# Patient Record
Sex: Female | Born: 1970 | Race: White | Hispanic: No | Marital: Married | Smoking: Never smoker
Health system: Southern US, Community
[De-identification: ages and names within clinical notes are randomized; demographics above are authoritative.]

## PROBLEM LIST (undated history)

## (undated) DIAGNOSIS — I8393 Asymptomatic varicose veins of bilateral lower extremities: Secondary | ICD-10-CM

## (undated) DIAGNOSIS — G43909 Migraine, unspecified, not intractable, without status migrainosus: Secondary | ICD-10-CM

## (undated) DIAGNOSIS — K219 Gastro-esophageal reflux disease without esophagitis: Secondary | ICD-10-CM

## (undated) DIAGNOSIS — I872 Venous insufficiency (chronic) (peripheral): Secondary | ICD-10-CM

## (undated) DIAGNOSIS — F329 Major depressive disorder, single episode, unspecified: Secondary | ICD-10-CM

## (undated) DIAGNOSIS — E079 Disorder of thyroid, unspecified: Secondary | ICD-10-CM

## (undated) DIAGNOSIS — K8010 Calculus of gallbladder with chronic cholecystitis without obstruction: Secondary | ICD-10-CM

## (undated) DIAGNOSIS — M1990 Unspecified osteoarthritis, unspecified site: Secondary | ICD-10-CM

## (undated) DIAGNOSIS — F419 Anxiety disorder, unspecified: Secondary | ICD-10-CM

## (undated) DIAGNOSIS — R030 Elevated blood-pressure reading, without diagnosis of hypertension: Secondary | ICD-10-CM

## (undated) DIAGNOSIS — F32A Depression, unspecified: Secondary | ICD-10-CM

## (undated) DIAGNOSIS — R12 Heartburn: Secondary | ICD-10-CM

## (undated) DIAGNOSIS — E039 Hypothyroidism, unspecified: Secondary | ICD-10-CM

## (undated) DIAGNOSIS — Z87442 Personal history of urinary calculi: Secondary | ICD-10-CM

## (undated) DIAGNOSIS — E669 Obesity, unspecified: Secondary | ICD-10-CM

## (undated) DIAGNOSIS — E785 Hyperlipidemia, unspecified: Secondary | ICD-10-CM

## (undated) HISTORY — DX: Calculus of gallbladder with chronic cholecystitis without obstruction: K80.10

## (undated) HISTORY — DX: Migraine, unspecified, not intractable, without status migrainosus: G43.909

## (undated) HISTORY — PX: OTHER SURGICAL HISTORY: SHX169

## (undated) HISTORY — DX: Depression, unspecified: F32.A

## (undated) HISTORY — DX: Asymptomatic varicose veins of bilateral lower extremities: I83.93

## (undated) HISTORY — DX: Venous insufficiency (chronic) (peripheral): I87.2

## (undated) HISTORY — DX: Disorder of thyroid, unspecified: E07.9

## (undated) HISTORY — DX: Hyperlipidemia, unspecified: E78.5

## (undated) HISTORY — DX: Anxiety disorder, unspecified: F41.9

## (undated) HISTORY — DX: Obesity, unspecified: E66.9

## (undated) HISTORY — DX: Unspecified osteoarthritis, unspecified site: M19.90

## (undated) HISTORY — DX: Major depressive disorder, single episode, unspecified: F32.9

## (undated) HISTORY — DX: Heartburn: R12

---

## 1998-03-04 ENCOUNTER — Other Ambulatory Visit: Admission: RE | Admit: 1998-03-04 | Discharge: 1998-03-04 | Payer: Self-pay | Admitting: *Deleted

## 1998-08-23 ENCOUNTER — Inpatient Hospital Stay (HOSPITAL_COMMUNITY): Admission: AD | Admit: 1998-08-23 | Discharge: 1998-08-23 | Payer: Self-pay | Admitting: Obstetrics and Gynecology

## 1998-10-06 ENCOUNTER — Inpatient Hospital Stay (HOSPITAL_COMMUNITY): Admission: AD | Admit: 1998-10-06 | Discharge: 1998-10-09 | Payer: Self-pay | Admitting: *Deleted

## 1998-11-25 ENCOUNTER — Other Ambulatory Visit: Admission: RE | Admit: 1998-11-25 | Discharge: 1998-11-25 | Payer: Self-pay | Admitting: *Deleted

## 1999-12-20 ENCOUNTER — Other Ambulatory Visit: Admission: RE | Admit: 1999-12-20 | Discharge: 1999-12-20 | Payer: Self-pay | Admitting: Obstetrics & Gynecology

## 2007-07-11 ENCOUNTER — Ambulatory Visit: Payer: Self-pay

## 2008-05-20 ENCOUNTER — Ambulatory Visit: Payer: Self-pay | Admitting: Family Medicine

## 2010-09-19 ENCOUNTER — Ambulatory Visit: Payer: Self-pay

## 2012-01-02 ENCOUNTER — Ambulatory Visit: Payer: Self-pay

## 2013-01-03 ENCOUNTER — Ambulatory Visit: Payer: Self-pay | Admitting: Neurology

## 2013-09-14 ENCOUNTER — Ambulatory Visit: Payer: Self-pay

## 2014-08-18 ENCOUNTER — Ambulatory Visit: Payer: BC Managed Care – PPO | Admitting: Internal Medicine

## 2015-03-03 ENCOUNTER — Other Ambulatory Visit: Payer: Self-pay

## 2015-03-03 NOTE — Telephone Encounter (Signed)
Chart shows that [place] (Dr. Gabriel Carina) prescribes this Please send to her or have pharmacy contact Dr. Gabriel Carina Thanks

## 2015-03-04 ENCOUNTER — Telehealth: Payer: Self-pay | Admitting: Family Medicine

## 2015-03-04 MED ORDER — LEVOTHYROXINE SODIUM 112 MCG PO TABS: 112.0000 ug | ORAL_TABLET | Freq: Every day | ORAL | Status: DC

## 2015-03-04 NOTE — Telephone Encounter (Signed)
Pt called stated she needs a refill on her Synthroid, pt states she has seen Dr. Sanda Klein and had labs drawn by her to check levels and that she transferred care from Dr. Purnell Shoemaker?  to Dr. Sanda Klein for treatment of thyroid issues. Pt stated she is completely out of her medication and is leaving to go out of town early in the morning. Pt last seen for this issue on 12/31/14.

## 2015-03-04 NOTE — Telephone Encounter (Signed)
I left message for patient to call Dr. Joycie Peek office and have them refill the med.

## 2015-03-04 NOTE — Telephone Encounter (Signed)
Thank you. I did not realize she is no longer seeing Dr. Gabriel Carina. Rx sent

## 2015-05-05 ENCOUNTER — Other Ambulatory Visit: Payer: Self-pay | Admitting: Certified Nurse Midwife

## 2015-05-05 ENCOUNTER — Other Ambulatory Visit: Payer: Self-pay | Admitting: Family Medicine

## 2015-05-05 DIAGNOSIS — Z1231 Encounter for screening mammogram for malignant neoplasm of breast: Secondary | ICD-10-CM

## 2015-05-06 ENCOUNTER — Ambulatory Visit
Admission: RE | Admit: 2015-05-06 | Discharge: 2015-05-06 | Disposition: A | Discharge status: Home / Self Care | Payer: BC Managed Care – PPO | Source: Ambulatory Visit | Attending: Family Medicine | Admitting: Family Medicine

## 2015-05-06 DIAGNOSIS — Z1231 Encounter for screening mammogram for malignant neoplasm of breast: Secondary | ICD-10-CM | POA: Insufficient documentation

## 2015-06-24 ENCOUNTER — Ambulatory Visit: Payer: Self-pay | Admitting: Family Medicine

## 2015-08-26 ENCOUNTER — Other Ambulatory Visit: Payer: Self-pay

## 2015-08-26 NOTE — Telephone Encounter (Signed)
We last saw patient back in April. She was wanting a refill on Sumatriptan. She was getting it from Dr. Brigitte Pulse but has not seen him in several years and they will no longer refill it.

## 2015-08-27 MED ORDER — SUMATRIPTAN SUCCINATE 100 MG PO TABS
ORAL_TABLET | ORAL | Status: DC
Start: 2015-08-27 — End: 2018-10-01

## 2015-08-27 NOTE — Telephone Encounter (Signed)
Last BP reviewed; rx approved

## 2015-12-15 ENCOUNTER — Ambulatory Visit (INDEPENDENT_AMBULATORY_CARE_PROVIDER_SITE_OTHER): Payer: BC Managed Care – PPO | Admitting: Family Medicine

## 2015-12-15 ENCOUNTER — Encounter: Payer: Self-pay | Admitting: Family Medicine

## 2015-12-15 VITALS — BP 138/64 | HR 109 | Temp 99.8°F | Ht 65.0 in | Wt 214.0 lb

## 2015-12-15 DIAGNOSIS — J069 Acute upper respiratory infection, unspecified: Secondary | ICD-10-CM

## 2015-12-15 DIAGNOSIS — J189 Pneumonia, unspecified organism: Secondary | ICD-10-CM

## 2015-12-15 DIAGNOSIS — J181 Lobar pneumonia, unspecified organism: Principal | ICD-10-CM

## 2015-12-15 LAB — VERITOR FLU A/B WAIVED
INFLUENZA A: NEGATIVE
Influenza B: NEGATIVE

## 2015-12-15 MED ORDER — DOXYCYCLINE HYCLATE 100 MG PO TABS: 100.0000 mg | ORAL_TABLET | Freq: Two times a day (BID) | ORAL | Status: DC

## 2015-12-15 MED ORDER — HYDROCOD POLST-CPM POLST ER 10-8 MG/5ML PO SUER: 5.0000 mL | Freq: Every evening | ORAL | Status: DC | PRN

## 2015-12-15 MED ORDER — BENZONATATE 200 MG PO CAPS: 200.0000 mg | ORAL_CAPSULE | Freq: Two times a day (BID) | ORAL | Status: DC | PRN

## 2015-12-15 NOTE — Progress Notes (Signed)
BP 138/64 mmHg  Pulse 109  Temp(Src) 99.8 F (37.7 C)  Ht 5\' 5"  (1.651 m)  Wt 214 lb (97.07 kg)  BMI 35.61 kg/m2  SpO2 96%  LMP 09/07/2014 (Approximate)   Subjective:    Patient ID: Nicole Osborn, female    DOB: [birth year], 45 y.o.   MRN: [id number]  HPI: Nicole Osborn is a 45 y.o. female  Chief Complaint  Patient presents with  . URI    x3 weeks / taking Nyquil- worsened yesterday   UPPER RESPIRATORY TRACT INFECTION Duration: 3 weeks, but changed and got much worse yesterday Worst symptom: body aches Fever: yes Cough: yes Shortness of breath: no Wheezing: no Chest pain: no Chest tightness: yes Chest congestion: yes Nasal congestion: yes Runny nose: yes Post nasal drip: yes Sneezing: yes Sore throat: yes Swollen glands: yes Sinus pressure: yes Headache: yes Face pain: no Toothache: no Ear pain: no  Ear pressure: yes bilateral Eyes red/itching:yes Eye drainage/crusting: yes  Vomiting: yes Rash: no Fatigue: yes Sick contacts: no Strep contacts: no  Context: worse Recurrent sinusitis: no Relief with OTC cold/cough medications: no  Treatments attempted: anti-histamine and cough syrup   Relevant past medical, surgical, family and social history reviewed and updated as indicated. Interim medical history since our last visit reviewed. Allergies and medications reviewed and updated.  Review of Systems  Constitutional: Positive for fever, chills, diaphoresis and fatigue. Negative for activity change, appetite change and unexpected weight change.  HENT: Positive for congestion, postnasal drip, rhinorrhea, sinus pressure, sneezing and sore throat. Negative for dental problem, drooling, ear discharge, ear pain, facial swelling, hearing loss, mouth sores, nosebleeds, tinnitus, trouble swallowing and voice change.   Eyes: Negative.   Respiratory: Positive for cough, chest tightness, shortness of breath and wheezing. Negative for apnea, choking and stridor.    Cardiovascular: Negative.   Psychiatric/Behavioral: Negative.     Per HPI unless specifically indicated above     Objective:    BP 138/64 mmHg  Pulse 109  Temp(Src) 99.8 F (37.7 C)  Ht 5\' 5"  (1.651 m)  Wt 214 lb (97.07 kg)  BMI 35.61 kg/m2  SpO2 96%  LMP 09/07/2014 (Approximate)  Wt Readings from Last 3 Encounters:  12/15/15 214 lb (97.07 kg)  12/31/14 208 lb (94.348 kg)    Physical Exam  Constitutional: She is oriented to person, place, and time. She appears well-developed and well-nourished. No distress.  HENT:  Head: Normocephalic and atraumatic.  Right Ear: Hearing, tympanic membrane, external ear and ear canal normal.  Left Ear: Hearing, tympanic membrane, external ear and ear canal normal.  Nose: Mucosal edema and rhinorrhea present.  Mouth/Throat: Uvula is midline, oropharynx is clear and moist and mucous membranes are normal. No oropharyngeal exudate.  Eyes: Conjunctivae, EOM and lids are normal. Pupils are equal, round, and reactive to light. Right eye exhibits no discharge. Left eye exhibits no discharge. No scleral icterus.  Neck: Normal range of motion. Neck supple. No JVD present. No tracheal deviation present. No thyromegaly present.  Cardiovascular: Normal rate, regular rhythm, normal heart sounds and intact distal pulses.  Exam reveals no gallop and no friction rub.   No murmur heard. Pulmonary/Chest: Effort normal. No stridor. No respiratory distress. She has decreased breath sounds. She has no wheezes. She has rhonchi in the right lower field. She has no rales. She exhibits no tenderness.  Musculoskeletal: Normal range of motion.  Lymphadenopathy:    She has cervical adenopathy.  Neurological: She is alert and oriented to  person, place, and time.  Skin: Skin is warm, dry and intact. No rash noted. She is not diaphoretic. No erythema. No pallor.  Psychiatric: She has a normal mood and affect. Her speech is normal and behavior is normal. Judgment and thought  content normal. Cognition and memory are normal.  Nursing note and vitals reviewed.   No results found for this or any previous visit.    Assessment & Plan:   Problem List Items Addressed This Visit    None    Visit Diagnoses    RLL pneumonia    -  Primary    Will treat with doxy, tussionex and tessalon perles. Return in 2 weeks for lung recheck. Call with any concerns. Out of work until middle next week.     Relevant Medications    chlorpheniramine-HYDROcodone (TUSSIONEX PENNKINETIC ER) 10-8 MG/5ML SUER    benzonatate (TESSALON) 200 MG capsule    doxycycline (VIBRA-TABS) 100 MG tablet    Upper respiratory infection        Flu negative.     Relevant Orders    Influenza A & B (STAT)        Follow up plan: Return Lung recheck 2 weeks.

## 2016-02-02 ENCOUNTER — Other Ambulatory Visit: Payer: BC Managed Care – PPO

## 2016-02-02 ENCOUNTER — Telehealth: Payer: Self-pay

## 2016-02-02 DIAGNOSIS — E039 Hypothyroidism, unspecified: Secondary | ICD-10-CM

## 2016-02-02 MED ORDER — LEVOTHYROXINE SODIUM 112 MCG PO TABS: 112.0000 ug | ORAL_TABLET | Freq: Every day | ORAL | Status: DC

## 2016-02-02 NOTE — Telephone Encounter (Signed)
Dr. Sanda Klein may be willing to do that if she is following her. I have no blood work on her and have never seen her. I am more than happy to send through a 30 day supply, but I will need a blood draw first.

## 2016-02-02 NOTE — Telephone Encounter (Signed)
Patient would like to know if she can get a 30 day supply of thyroid medication, I think that she is going to go with Dr.Lada.

## 2016-02-02 NOTE — Addendum Note (Signed)
Addended by: Valerie Roys on: 02/02/2016 05:02 PM   Modules accepted: Orders

## 2016-02-02 NOTE — Telephone Encounter (Signed)
Pharmacy is requesting a refill on patients synthroid medication. I do not see where patient has an up coming patient, or where she has been seen. She needs to come in for a visit correct, former [facility] patient.

## 2016-02-02 NOTE — Telephone Encounter (Signed)
Rx sent to her pharmacy 

## 2016-02-02 NOTE — Telephone Encounter (Signed)
I have NO thyroid labs on her, meaning she hasn't had them done in almost a year. She needs to come in for blood work and then I will get her her medicine. She can make an appointment whenever she likes, but she can't get any medicine without a lab draw.

## 2016-02-02 NOTE — Telephone Encounter (Signed)
Labs have been drawn, can you send it over now.

## 2016-02-03 LAB — THYROID PANEL WITH TSH
FREE THYROXINE INDEX: 2 (ref 1.2–4.9)
T3 UPTAKE RATIO: 26 % (ref 24–39)
T4 TOTAL: 7.5 ug/dL (ref 4.5–12.0)
TSH: 5.62 u[IU]/mL — ABNORMAL HIGH (ref 0.450–4.500)

## 2016-02-08 ENCOUNTER — Telehealth: Payer: Self-pay | Admitting: Family Medicine

## 2016-02-08 NOTE — Telephone Encounter (Signed)
Please let her know that her thyroid came back under active. If she was taking her medicine every day, then we will increase her medicine. If she was missing days here and there, then we'll recheck it at her follow up and continue her current regimen but encourage her to take her medicine every day. Thanks!

## 2016-02-08 NOTE — Telephone Encounter (Signed)
Left message to call.

## 2016-02-09 ENCOUNTER — Other Ambulatory Visit: Payer: Self-pay | Admitting: Family Medicine

## 2016-02-09 MED ORDER — LEVOTHYROXINE SODIUM 125 MCG PO TABS: 125.0000 ug | ORAL_TABLET | Freq: Every day | ORAL | Status: DC

## 2016-02-09 NOTE — Telephone Encounter (Signed)
Left message to call.

## 2016-02-09 NOTE — Telephone Encounter (Signed)
Patient states she takes it every day.

## 2016-04-03 ENCOUNTER — Telehealth: Payer: Self-pay | Admitting: Family Medicine

## 2016-04-03 MED ORDER — LEVOTHYROXINE SODIUM 125 MCG PO TABS: 125.0000 ug | ORAL_TABLET | Freq: Every day | ORAL | Status: DC

## 2016-04-03 NOTE — Telephone Encounter (Signed)
Pt needs refill on Synthroid 125 mg. [facility] Liter from [facility] gave her the last refill and upped to 125mg . Pt has an appt on 04/23/2016 and she is going out of town. [facility]

## 2016-04-03 NOTE — Telephone Encounter (Signed)
Patient going out of town, but will need labs at visit to verify higher dose not too much; Rx for 30 days sent

## 2016-04-07 ENCOUNTER — Other Ambulatory Visit: Payer: Self-pay | Admitting: Family Medicine

## 2016-04-23 ENCOUNTER — Encounter: Payer: Self-pay | Admitting: Family Medicine

## 2016-04-23 ENCOUNTER — Ambulatory Visit (INDEPENDENT_AMBULATORY_CARE_PROVIDER_SITE_OTHER): Payer: BC Managed Care – PPO | Admitting: Family Medicine

## 2016-04-23 VITALS — BP 122/68 | HR 95 | Temp 97.6°F | Resp 14 | Wt 214.0 lb

## 2016-04-23 DIAGNOSIS — E038 Other specified hypothyroidism: Secondary | ICD-10-CM | POA: Diagnosis not present

## 2016-04-23 DIAGNOSIS — Z0000 Encounter for general adult medical examination without abnormal findings: Secondary | ICD-10-CM

## 2016-04-23 DIAGNOSIS — M25472 Effusion, left ankle: Secondary | ICD-10-CM | POA: Insufficient documentation

## 2016-04-23 DIAGNOSIS — R519 Headache, unspecified: Secondary | ICD-10-CM

## 2016-04-23 DIAGNOSIS — E669 Obesity, unspecified: Secondary | ICD-10-CM | POA: Insufficient documentation

## 2016-04-23 DIAGNOSIS — R51 Headache: Secondary | ICD-10-CM

## 2016-04-23 DIAGNOSIS — R21 Rash and other nonspecific skin eruption: Secondary | ICD-10-CM | POA: Diagnosis not present

## 2016-04-23 DIAGNOSIS — E034 Atrophy of thyroid (acquired): Secondary | ICD-10-CM | POA: Diagnosis not present

## 2016-04-23 DIAGNOSIS — E039 Hypothyroidism, unspecified: Secondary | ICD-10-CM | POA: Insufficient documentation

## 2016-04-23 DIAGNOSIS — N926 Irregular menstruation, unspecified: Secondary | ICD-10-CM | POA: Diagnosis not present

## 2016-04-23 DIAGNOSIS — R350 Frequency of micturition: Secondary | ICD-10-CM | POA: Diagnosis not present

## 2016-04-23 LAB — TSH: TSH: 0.7 mIU/L

## 2016-04-23 NOTE — Progress Notes (Signed)
BP 122/68   Pulse 95   Temp 97.6 F (36.4 C) (Oral)   Resp 14   Wt 214 lb (97.1 kg)   LMP 08/25/2015   SpO2 95%   BMI 35.61 kg/m    Subjective:    Patient ID: Nicole Osborn, female    DOB: 12/23/1970, 45 y.o.   MRN: [id number]  HPI: Nicole Osborn is a 45 y.o. female  Chief Complaint  Patient presents with  . Follow-up  . Referral   Patient had labs done in May, thyroid medicine was increased and due for rechecked; she has been trying to lose weight; no heart palpitations or chest pain; no loose stools now; has had some loose stools for years when she drinks dairy, frappe  She is having more migraines; more frequently in the mornings; not sure if related to the increased thyroid medicine I asked if the headaches are truly migraines, and she says she has had nausea with them; went to see Dr. Manuella Ghazi, he prescribed suppositories for nausea; she takes a sumatriptan; most days, one pill is enough, but sometimes has to take a 2nd pill; Dr. Manuella Ghazi is the neurologist; I asked if he knew that her headaches were in the morning She is having headaches just about every day now, at least over the summer; she refills the sumatriptan every month; no aura, just pain more on one side, sometimes numbness on one side She cannot tolerate topamax, gets numbness on the left Dr. Manuella Ghazi wanted a sleep study; she cannot sleeping with CPAP  She urinates a lot; she made a list of things to cover For 24 years, she feels like she has to urinate all the time; urologist saw her and said it was all in her head; she goes every 45 minutes; gets up frequently and not emptying completely; tested for that She wants a referral  Left ankle has been swelling for 5 years; unexplained injury; saw podiatrist, lateral malleolus, worse when hot or in the summer  She had a rash on her legs at the beach last week; resolving  Ankles feel weak when stepping off curb  Depression screen [facility] 2/9 04/23/2016  Decreased Interest 0    Down, Depressed, Hopeless 0  PHQ - 2 Score 0   Relevant past medical, surgical, family and social history reviewed Past Medical History:  Diagnosis Date  . Arthritis   . Hyperlipidemia   . Hypertension   . Migraines    without aura  . Obesity   . Thyroid disease    Past Surgical History:  Procedure Laterality Date  . Removal of extra tooth     Family History  Problem Relation Age of Onset  . Breast cancer Paternal Aunt 109  . Stroke Father   . Hypertension Father    Social History  Substance Use Topics  . Smoking status: Never Smoker  . Smokeless tobacco: Never Used  . Alcohol use No   Interim medical history since last visit reviewed. Allergies and medications reviewed  Review of Systems Per HPI unless specifically indicated above     Objective:    BP 122/68   Pulse 95   Temp 97.6 F (36.4 C) (Oral)   Resp 14   Wt 214 lb (97.1 kg)   LMP 08/25/2015   SpO2 95%   BMI 35.61 kg/m   Wt Readings from Last 3 Encounters:  04/23/16 214 lb (97.1 kg)  12/15/15 214 lb (97.1 kg)  12/31/14 208 lb (94.3 kg)  Physical Exam  Constitutional: She appears well-developed and well-nourished. No distress.  HENT:  Head: Normocephalic and atraumatic.  Eyes: EOM are normal. No scleral icterus.  Neck: No thyromegaly present.  Cardiovascular: Normal rate, regular rhythm and normal heart sounds.   No murmur heard. Pulmonary/Chest: Effort normal and breath sounds normal. No respiratory distress. She has no wheezes.  Abdominal: Soft. Bowel sounds are normal. She exhibits no distension.  Musculoskeletal: Normal range of motion. She exhibits no edema.       Left ankle: She exhibits swelling and deformity.  There feels to be a large contiguous area of fluid/swelling primarily at the lateral malleolus extending anteriorly; effusion/fluid noted, with compression of one area laterally causing the anteromedial aspect to protrude; no erythema; no overlying skin change  Neurological:  She is alert. She exhibits normal muscle tone.  Skin: Skin is warm and dry. She is not diaphoretic. No pallor.  Very remaining erythematous pinpoint lesions on shins; no vesicles; no dermatomal pattern; no scale  Psychiatric: She has a normal mood and affect. Her behavior is normal. Judgment and thought content normal.      Assessment & Plan:   Problem List Items Addressed This Visit      Endocrine   Hypothyroidism    Check TSH and adjust med if needed      Relevant Orders   TSH (Completed)     Musculoskeletal and Integument   Swelling of left ankle joint    Refer to ankle specialist; this behaves like a Baker's cyst, though I have never seen one at the ankle      Relevant Orders   Ambulatory referral to Orthopedic Surgery   Rash     Other   Urinary frequency - Primary    Refer to urologist; avoid caffeine, check urine today      Relevant Orders   Urinalysis w microscopic + reflex cultur (Completed)   Ambulatory referral to Urology   Obesity    Encouragement given to work on weight loss; see AVS      Missed period    Check LH/FSH; 50% of women undergo menopause by age 71 so she may be entering perimenopause; she declined pregnancy test      Relevant Orders   Luteinizing hormone (Completed)   Follicle stimulating hormone (Completed)   Increased frequency of headaches    Note sent to neurologist through [facility]; patient to f/u with neurologist about her headaches, to see if imaging warranted       Other Visit Diagnoses    Preventative health care       Relevant Orders   Comprehensive Metabolic Panel (CMET) (Completed)   Lipid panel (Completed)       Follow up plan: Return in about 6 weeks (around 06/04/2016) for complete physical.  An after-visit summary was printed and given to the patient at [facility].  Please see the patient instructions which may contain other information and recommendations beyond what is mentioned above in the assessment and  plan.  Meds ordered this encounter  Medications  . promethazine (PHENERGAN) 25 MG suppository    Sig: USE ONE SUPPOSITORY RECTALLY EVERY 6 HOURS AS NEEDED FOR NAUSEA FOR UP TO 7 DAYS    Refill:  0    Orders Placed This Encounter  Procedures  . Urinalysis w microscopic + reflex cultur  . TSH  . Luteinizing hormone  . Follicle stimulating hormone  . Comprehensive Metabolic Panel (CMET)  . Lipid panel  . Ambulatory referral to Urology  .  Ambulatory referral to Orthopedic Surgery

## 2016-04-23 NOTE — Assessment & Plan Note (Signed)
Refer to urologist; avoid caffeine, check urine today

## 2016-04-23 NOTE — Assessment & Plan Note (Addendum)
Refer to ankle specialist; this behaves like a Baker's cyst, though I have never seen one at the ankle

## 2016-04-23 NOTE — Patient Instructions (Addendum)
I've sent Dr. Manuella Ghazi a note about your headaches, and I'd like you to follow-up with him Talk with him about getting a sleep apnea test and whether or not botox might be an option  Check out the information at familydoctor.org entitled "Nutrition for Weight Loss: What You Need to Know about Fad Diets" Try to lose between 1-2 pounds per week by taking in fewer calories and burning off more calories You can succeed by limiting portions, limiting foods dense in calories and fat, becoming more active, and drinking 8 glasses of water a day (64 ounces) Don't skip meals, especially breakfast, as skipping meals may alter your metabolism Do not use over-the-counter weight loss pills or gimmicks that claim rapid weight loss A healthy BMI (or body mass index) is between 18.5 and 24.9 You can calculate your ideal BMI at the [place] website ClubMon[employer]fr  We'll check labs today We'll have you see Dr. Erlene Quan and the foot/ankle specialist Return for a physical in the next month or two

## 2016-04-23 NOTE — Assessment & Plan Note (Signed)
Check TSH and adjust med if needed 

## 2016-04-24 LAB — COMPREHENSIVE METABOLIC PANEL
ALT: 27 U/L (ref 6–29)
AST: 35 U/L (ref 10–35)
Albumin: 4.6 g/dL (ref 3.6–5.1)
Alkaline Phosphatase: 78 U/L (ref 33–115)
BILIRUBIN TOTAL: 0.3 mg/dL (ref 0.2–1.2)
BUN: 14 mg/dL (ref 7–25)
CALCIUM: 10.2 mg/dL (ref 8.6–10.2)
CO2: 25 mmol/L (ref 20–31)
CREATININE: 0.82 mg/dL (ref 0.50–1.10)
Chloride: 104 mmol/L (ref 98–110)
GLUCOSE: 86 mg/dL (ref 65–99)
Potassium: 4 mmol/L (ref 3.5–5.3)
SODIUM: 142 mmol/L (ref 135–146)
Total Protein: 7.3 g/dL (ref 6.1–8.1)

## 2016-04-24 LAB — URINALYSIS W MICROSCOPIC + REFLEX CULTURE
BACTERIA UA: NONE SEEN [HPF]
Bilirubin Urine: NEGATIVE
Casts: NONE SEEN [LPF]
Crystals: NONE SEEN [HPF]
GLUCOSE, UA: NEGATIVE
Hgb urine dipstick: NEGATIVE
Ketones, ur: NEGATIVE
LEUKOCYTES UA: NEGATIVE
NITRITE: NEGATIVE
PROTEIN: NEGATIVE
RBC / HPF: NONE SEEN RBC/HPF (ref <=2)
Specific Gravity, Urine: 1.009 (ref 1.001–1.035)
Squamous Epithelial / LPF: NONE SEEN [HPF] (ref <=5)
WBC, UA: NONE SEEN WBC/HPF (ref <=5)
YEAST: NONE SEEN [HPF]
pH: 6 (ref 5.0–8.0)

## 2016-04-24 LAB — FOLLICLE STIMULATING HORMONE: FSH: 64.3 m[IU]/mL

## 2016-04-24 LAB — LIPID PANEL
Cholesterol: 211 mg/dL — ABNORMAL HIGH (ref 125–200)
HDL: 39 mg/dL — AB (ref >=46)
LDL CALC: 123 mg/dL (ref <130)
Total CHOL/HDL Ratio: 5.4 Ratio — ABNORMAL HIGH (ref <=5.0)
Triglycerides: 244 mg/dL — ABNORMAL HIGH (ref <150)
VLDL: 49 mg/dL — ABNORMAL HIGH (ref <30)

## 2016-04-24 LAB — LUTEINIZING HORMONE: LH: 33.8 m[IU]/mL

## 2016-04-28 DIAGNOSIS — R519 Headache, unspecified: Secondary | ICD-10-CM | POA: Insufficient documentation

## 2016-04-28 DIAGNOSIS — R51 Headache: Secondary | ICD-10-CM

## 2016-04-28 NOTE — Assessment & Plan Note (Signed)
Encouragement given to work on weight loss; see AVS 

## 2016-04-28 NOTE — Assessment & Plan Note (Signed)
Note sent to neurologist through [facility]; patient to f/u with neurologist about her headaches, to see if imaging warranted

## 2016-04-28 NOTE — Assessment & Plan Note (Signed)
Check LH/FSH; 50% of women undergo menopause by age 47 so she may be entering perimenopause; she declined pregnancy test

## 2016-05-09 ENCOUNTER — Other Ambulatory Visit: Payer: Self-pay | Admitting: Family Medicine

## 2016-05-12 NOTE — Telephone Encounter (Signed)
Last TSH normal; Rx sent

## 2016-06-01 ENCOUNTER — Ambulatory Visit: Payer: BC Managed Care – PPO | Admitting: Urology

## 2016-06-22 ENCOUNTER — Ambulatory Visit: Payer: BC Managed Care – PPO | Admitting: Urology

## 2016-06-28 ENCOUNTER — Other Ambulatory Visit: Payer: Self-pay | Admitting: Podiatry

## 2016-06-28 DIAGNOSIS — M7672 Peroneal tendinitis, left leg: Secondary | ICD-10-CM

## 2016-07-11 ENCOUNTER — Ambulatory Visit
Admission: RE | Admit: 2016-07-11 | Discharge: 2016-07-11 | Disposition: A | Discharge status: Home / Self Care | Payer: BC Managed Care – PPO | Source: Ambulatory Visit | Attending: Podiatry | Admitting: Podiatry

## 2016-07-11 DIAGNOSIS — M7672 Peroneal tendinitis, left leg: Secondary | ICD-10-CM | POA: Diagnosis not present

## 2016-07-11 DIAGNOSIS — M25472 Effusion, left ankle: Secondary | ICD-10-CM | POA: Diagnosis not present

## 2016-07-11 DIAGNOSIS — S82832A Other fracture of upper and lower end of left fibula, initial encounter for closed fracture: Secondary | ICD-10-CM | POA: Diagnosis not present

## 2016-07-11 DIAGNOSIS — X58XXXA Exposure to other specified factors, initial encounter: Secondary | ICD-10-CM | POA: Diagnosis not present

## 2016-08-09 ENCOUNTER — Ambulatory Visit: Payer: BC Managed Care – PPO | Admitting: Urology

## 2016-08-09 ENCOUNTER — Encounter: Payer: Self-pay | Admitting: Urology

## 2016-08-09 VITALS — BP 114/68 | HR 88 | Ht 64.0 in | Wt 216.2 lb

## 2016-08-09 DIAGNOSIS — N3946 Mixed incontinence: Secondary | ICD-10-CM

## 2016-08-09 DIAGNOSIS — R350 Frequency of micturition: Secondary | ICD-10-CM | POA: Diagnosis not present

## 2016-08-09 LAB — BLADDER SCAN AMB NON-IMAGING: Scan Result: 19

## 2016-08-09 NOTE — Progress Notes (Signed)
New

## 2016-08-09 NOTE — Progress Notes (Signed)
08/09/2016 4:33 PM   Lovett Sox 1971/02/01 [id number]  Referring provider: Arnetha Courser, MD [address] [place], [place] [address]  Chief Complaint  Patient presents with  . New Patient (Initial Visit)    urinary frequency referred by Dr. Sanda Klein    HPI: Patient is a 46 -year-old Caucasian female who is referred to [place] by, Dr. Sanda Klein, for frequency and urinary incontinence.  Patient states that she has had urinary incontinence for the last year and one half.  Patient has incontinence with stress and urge.   She is experiencing two incontinent episodes during the day. She is experiencing no incontinent episodes during the night.  Her incontinence volume is moderate.   She is wearing two pads/depends daily.    She is having associated urinary frequency (every hour), nocturia x 5-6, intermittency, hesitancy and straining to urinate.  She has been having frequency for 23 years.  She does not have a history of urinary tract infections, STI's or injury to the bladder.   She denies dysuria, gross hematuria, suprapubic pain, back pain, abdominal pain or flank pain.   She has not had any recent fevers, chills, nausea or vomiting.   She does not have a history of nephrolithiasis, GU surgery or GU trauma.   She is sexually active.  She has not noted incontinence with sexual intercourse.   She is not post menopausal.   She denies constipation and/or diarrhea.   She is not having pain with bladder filling.    She has not had any recent imaging studies.    She is drinking 48 ounces of water daily.   She is drinking two caffeinated beverages daily.  She is not drinking  alcohol daily.    Her risk factors for incontinence are obesity, vaginal delivery, a family history of incontinence, age and caffeine.  PMH: Past Medical History:  Diagnosis Date  . Anxiety   . Arthritis   . Depression   . Heartburn   . Hyperlipidemia   . Hypertension   . Kidney stone   . Migraines      without aura  . Obesity   . Thyroid disease     Surgical History: Past Surgical History:  Procedure Laterality Date  . Removal of extra tooth      Home Medications:    Medication List       Accurate as of 08/09/16  4:33 PM. Always use your most recent med list.          IBUPROFEN PM PO Take by mouth.   levothyroxine 125 MCG tablet Commonly known as:  SYNTHROID, LEVOTHROID TAKE 1 TABLET (125 MCG TOTAL) BY MOUTH DAILY BEFORE BREAKFAST.   Magnesium 250 MG Tabs Take by mouth.   MULTI-VITAMINS Tabs Take by mouth.   promethazine 25 MG suppository Commonly known as:  PHENERGAN USE ONE SUPPOSITORY RECTALLY EVERY 6 HOURS AS NEEDED FOR NAUSEA FOR UP TO 7 DAYS   SUMAtriptan 100 MG tablet Commonly known as:  IMITREX One by mouth at onset of migraine; may repeat one pill 2 hours later if absolutely needed; max 2 pills in 24 hours   Vitamin D3 2000 units capsule Take by mouth.       Allergies:  Allergies  Allergen Reactions  . Cefaclor Swelling    Family History: Family History  Problem Relation Age of Onset  . Stroke Father   . Hypertension Father   . Breast cancer Paternal Aunt 2  . Kidney disease  Neg Hx   . Bladder Cancer Neg Hx     Social History:  reports that she has never smoked. She has never used smokeless tobacco. She reports that she does not drink alcohol or use drugs.  ROS: UROLOGY Frequent Urination?: Yes Hard to postpone urination?: No Burning/pain with urination?: No Get up at night to urinate?: Yes Leakage of urine?: Yes Urine stream starts and stops?: Yes Trouble starting stream?: Yes Do you have to strain to urinate?: Yes Blood in urine?: No Urinary tract infection?: No Sexually transmitted disease?: No Injury to kidneys or bladder?: No Painful intercourse?: No Weak stream?: No Currently pregnant?: No Vaginal bleeding?: No Last menstrual period?: n  Gastrointestinal Nausea?: No Vomiting?: No Indigestion/heartburn?:  No Diarrhea?: No Constipation?: No  Constitutional Fever: No Night sweats?: No Weight loss?: No Fatigue?: Yes  Skin Skin rash/lesions?: No Itching?: No  Eyes Blurred vision?: Yes Double vision?: No  Ears/Nose/Throat Sore throat?: No Sinus problems?: No  Hematologic/Lymphatic Swollen glands?: No Easy bruising?: No  Cardiovascular Leg swelling?: No Chest pain?: No  Respiratory Cough?: No Shortness of breath?: No  Endocrine Excessive thirst?: No  Musculoskeletal Back pain?: No Joint pain?: No  Neurological Headaches?: Yes Dizziness?: Yes  Psychologic Depression?: No Anxiety?: No  Physical Exam: BP 114/68   Pulse 88   Ht 5\' 4"  (1.626 m)   Wt 216 lb 3.2 oz (98.1 kg)   LMP 05/25/2016 Comment: perimenopausal  BMI 37.11 kg/m   Constitutional: Well nourished. Alert and oriented, No acute distress. HEENT: [place] AT, moist mucus membranes. Trachea midline, no masses. Cardiovascular: No clubbing, cyanosis, or edema. Respiratory: Normal respiratory effort, no increased work of breathing. GI: Abdomen is soft, non tender, non distended, no abdominal masses. Liver and spleen not palpable.  No hernias appreciated.  Stool sample for occult testing is not indicated.   GU: No CVA tenderness.  No bladder fullness or masses.  Normal external genitalia, normal pubic hair distribution, no lesions.  Normal urethral meatus, no lesions, no prolapse, no discharge.   No urethral masses, tenderness and/or tenderness. No bladder fullness, tenderness or masses. Normal vagina mucosa, good estrogen effect, no discharge, no lesions, good pelvic support, no cystocele or rectocele noted.  No cervical motion tenderness.  Uterus is freely mobile and non-fixed.  No adnexal/parametria masses or tenderness noted.  Anus and perineum are without rashes or lesions.    Skin: No rashes, bruises or suspicious lesions. Lymph: No cervical or inguinal adenopathy. Neurologic: Grossly intact, no focal  deficits, moving all 4 extremities. Psychiatric: Normal mood and affect.  Laboratory Data:  Lab Results  Component Value Date   CREATININE 0.82 04/23/2016    Lab Results  Component Value Date   TSH 0.70 04/23/2016       Component Value Date/Time   CHOL 211 (H) 04/23/2016 1521   HDL 39 (L) 04/23/2016 1521   CHOLHDL 5.4 (H) 04/23/2016 1521   VLDL 49 (H) 04/23/2016 1521   LDLCALC 123 04/23/2016 1521    Lab Results  Component Value Date   AST 35 04/23/2016   Lab Results  Component Value Date   ALT 27 04/23/2016     Pertinent Imaging: Results for SATIA, DOUTHITT (MRN [id number]) as of 08/09/2016 16:05  Ref. Range 08/09/2016 16:01  Scan Result Unknown 19    Assessment & Plan:    1. Urinary frequency  - offered behavioral therapies, bladder training, bladder control strategies and/or pelvic floor muscle training - patient would like referral to PT  -  fluid management   - offered medical therapy with anticholinergic therapy or beta-3 adrenergic receptor agonist and the potential side effects of each therapy   - would like to try the beta-3 adrenergic receptor agonist (Myrbetriq).  Given Myrbetriq 50 mg samples, #28.  I have reviewed with the patient of the side effects of Myrbetriq, such as: elevation in BP, urinary retention and/or HA.     - RTC in 3 weeks for PVR and OAB Questionnaire  - BLADDER SCAN AMB NON-IMAGING  2. Mixed incontinence  - see above   Return in about 3 weeks (around 08/30/2016) for PVR and OAB questionnaire.  These notes generated with voice recognition software. I apologize for typographical errors.  Zara Council, [facility] [address], [place], [place] [address] [phone]

## 2016-08-22 ENCOUNTER — Ambulatory Visit: Payer: BC Managed Care – PPO | Admitting: Urology

## 2016-08-27 ENCOUNTER — Telehealth: Payer: Self-pay

## 2016-08-27 NOTE — Telephone Encounter (Signed)
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 Pt requested more samples and to change appt. When pt was transferred to the front pt wanted to cancel appt and call back later. Therefore only 2 weeks of samples was given.

## 2016-08-29 ENCOUNTER — Ambulatory Visit: Payer: BC Managed Care – PPO | Admitting: Urology

## 2016-10-12 ENCOUNTER — Ambulatory Visit (INDEPENDENT_AMBULATORY_CARE_PROVIDER_SITE_OTHER): Payer: BC Managed Care – PPO | Admitting: Family Medicine

## 2016-10-12 ENCOUNTER — Ambulatory Visit
Admission: RE | Admit: 2016-10-12 | Discharge: 2016-10-12 | Disposition: A | Discharge status: Home / Self Care | Payer: BC Managed Care – PPO | Source: Ambulatory Visit | Attending: Family Medicine | Admitting: Family Medicine

## 2016-10-12 ENCOUNTER — Encounter: Payer: Self-pay | Admitting: Family Medicine

## 2016-10-12 VITALS — BP 118/66 | HR 100 | Temp 98.2°F | Resp 16 | Wt 215.0 lb

## 2016-10-12 DIAGNOSIS — J029 Acute pharyngitis, unspecified: Secondary | ICD-10-CM

## 2016-10-12 DIAGNOSIS — R6889 Other general symptoms and signs: Secondary | ICD-10-CM | POA: Insufficient documentation

## 2016-10-12 DIAGNOSIS — R911 Solitary pulmonary nodule: Secondary | ICD-10-CM | POA: Diagnosis not present

## 2016-10-12 LAB — POCT INFLUENZA A/B
INFLUENZA A, POC: NEGATIVE
Influenza B, POC: NEGATIVE

## 2016-10-12 LAB — POCT RAPID STREP A (OFFICE): Rapid Strep A Screen: NEGATIVE

## 2016-10-12 MED ORDER — OSELTAMIVIR PHOSPHATE 75 MG PO CAPS: 75.0000 mg | ORAL_CAPSULE | Freq: Two times a day (BID) | ORAL | 0 refills | Status: AC

## 2016-10-12 MED ORDER — AZITHROMYCIN 250 MG PO TABS: ORAL_TABLET | ORAL | 0 refills | Status: DC

## 2016-10-12 NOTE — Patient Instructions (Signed)
Start the Tamiflu When you are well, return for a pneumonia vaccine Try vitamin C (orange juice if not diabetic or vitamin C tablets) and drink green tea to help your immune system during your illness Get plenty of rest and hydration Go to urgent care or the ER if getting worse Go from here to the outpatient imaging center across the street

## 2016-10-12 NOTE — Progress Notes (Signed)
BP 118/66   Pulse 100   Temp 98.2 F (36.8 C) (Oral)   Resp 16   Wt 215 lb (97.5 kg)   LMP 06/12/2016   SpO2 98%   BMI 36.90 kg/m    Subjective:    Patient ID: Nicole Osborn, female    DOB: Feb 04, 1971, 46 y.o.   MRN: [id number]  HPI: Nicole Osborn is a 46 y.o. female  Chief Complaint  Patient presents with  . 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 Anxiety   . Arthritis   . Depression   . Heartburn   . Hyperlipidemia   . Hypertension   . Kidney stone   . Migraines    without aura  . Obesity   . Thyroid disease    Past Surgical History:  Procedure Laterality Date  . Removal of extra tooth     Family History  Problem Relation Age of Onset  . Stroke Father   . Hypertension Father   . Breast cancer Paternal Aunt 78  . Kidney disease Neg Hx   . Bladder Cancer Neg Hx    Social History  Substance Use Topics  . Smoking status: Never Smoker  . Smokeless tobacco: Never Used  . Alcohol use No   Interim medical history since last visit reviewed. Allergies and medications reviewed  Review of Systems Per HPI unless specifically indicated above     Objective:    BP 118/66    Pulse 100   Temp 98.2 F (36.8 C) (Oral)   Resp 16   Wt 215 lb (97.5 kg)   LMP 06/12/2016   SpO2 98%   BMI 36.90 kg/m   Wt Readings from Last 3 Encounters:  10/12/16 215 lb (97.5 kg)  08/09/16 216 lb 3.2 oz (98.1 kg)  04/23/16 214 lb (97.1 kg)    Physical Exam  Constitutional: She appears well-developed and well-nourished. No distress (but appears to not feel well; non-toxic).  HENT:  Right Ear: Hearing, tympanic membrane, external ear and ear canal normal.  Left Ear: Hearing, tympanic membrane, external ear and ear canal normal.  Nose: Rhinorrhea present.  Mouth/Throat: Mucous membranes are not dry. Posterior oropharyngeal edema and posterior oropharyngeal erythema present. No oropharyngeal exudate.  Cardiovascular: Regular rhythm.   Borderline tachycardic  Pulmonary/Chest: No accessory muscle usage. No tachypnea. No respiratory distress. She has no wheezes. She has rhonchi in the left upper field.  Lymphadenopathy:    She has cervical adenopathy (shoddy).  Skin: She is not diaphoretic. No pallor.   Results for orders placed or performed in visit on 10/12/16  POCT Influenza A/B  Result Value  Ref Range   Influenza A, POC Negative Negative   Influenza B, POC Negative Negative  POCT rapid strep A  Result Value Ref Range   Rapid Strep A Screen Negative Negative      Assessment & Plan:   Problem List Items Addressed This Visit      Respiratory   Pharyngitis    Rapid strep and culture ordered for completeness, given the erythema and shoddy AC LAD      Relevant Orders   Culture, Group A Strep   POCT rapid strep A (Completed)    Other Visit Diagnoses    Flu-like symptoms    -  Primary   with few rhonchi on the left; will get CXR; start Tamiflu despite negative test; if pneumonia present, start ABX; rest, hydration, see AVS   Relevant Orders   POCT Influenza A/B (Completed)   DG Chest 2 View (Completed)   Nodule of right lung       Relevant Orders   CT Chest W  Contrast      Follow up plan: No Follow-up on file.  An after-visit summary was printed and given to the patient at [place].  Please see the patient instructions which may contain other information and recommendations beyond what is mentioned above in the assessment and plan.  Meds ordered this encounter  Medications  . zinc gluconate 50 MG tablet    Sig: Take 50 mg by mouth daily.  Marland Kitchen oseltamivir (TAMIFLU) 75 MG capsule    Sig: Take 1 capsule (75 mg total) by mouth 2 (two) times daily.    Dispense:  10 capsule    Refill:  0  . azithromycin (ZITHROMAX) 250 MG tablet    Sig: Two pills by mouth on day 1, then one pill daily for four more days    Dispense:  6 tablet    Refill:  0    Please leave on hold until patient calls for it; may not need it; good for 3 weeks    Orders Placed This Encounter  Procedures  . Culture, Group A Strep  . DG Chest 2 View  . CT Chest W Contrast  . POCT Influenza A/B  . POCT rapid strep A   Addendum: chest xray showed 6 mm nodule right lung; discussed with patient; will get chest CT next week; azithromycin if needed over weekend

## 2016-10-12 NOTE — Assessment & Plan Note (Addendum)
Rapid strep and culture ordered for completeness, given the erythema and shoddy AC LAD

## 2016-10-14 LAB — CULTURE, GROUP A STREP: ORGANISM ID, BACTERIA: NORMAL

## 2016-10-15 ENCOUNTER — Telehealth: Payer: Self-pay

## 2016-10-15 NOTE — Telephone Encounter (Signed)
I tried to contact this patient to inform her that she has been scheduled to have a CT chest w/ contrast on Wednesday, October 17, 2016 @ 1:30pm at the [facility], but there was no answer and her mailbox was full so I was not able to leave a message.  She is to arrive 15 mins early and she cannot eat anything but can have water 4 hours prior to appt.

## 2016-10-15 NOTE — Telephone Encounter (Signed)
An email was sent to this patient with all of the information below.

## 2016-10-17 ENCOUNTER — Ambulatory Visit: Admission: RE | Admit: 2016-10-17 | Payer: BC Managed Care – PPO | Source: Ambulatory Visit

## 2016-11-11 ENCOUNTER — Other Ambulatory Visit: Payer: Self-pay | Admitting: Family Medicine

## 2016-11-11 NOTE — Telephone Encounter (Signed)
Lab Results  Component Value Date   TSH 0.70 04/23/2016   T4TOTAL 7.5 02/02/2016   rx approved

## 2016-11-27 ENCOUNTER — Encounter: Payer: Self-pay | Admitting: Physical Therapy

## 2016-11-27 ENCOUNTER — Ambulatory Visit: Payer: BC Managed Care – PPO | Attending: Urology | Admitting: Physical Therapy

## 2016-11-27 DIAGNOSIS — R2689 Other abnormalities of gait and mobility: Secondary | ICD-10-CM | POA: Insufficient documentation

## 2016-11-27 DIAGNOSIS — R278 Other lack of coordination: Secondary | ICD-10-CM | POA: Diagnosis present

## 2016-11-27 DIAGNOSIS — M545 Low back pain: Secondary | ICD-10-CM | POA: Diagnosis present

## 2016-11-27 DIAGNOSIS — R531 Weakness: Secondary | ICD-10-CM | POA: Diagnosis present

## 2016-11-27 DIAGNOSIS — G8929 Other chronic pain: Secondary | ICD-10-CM | POA: Diagnosis present

## 2016-11-27 DIAGNOSIS — M25675 Stiffness of left foot, not elsewhere classified: Secondary | ICD-10-CM | POA: Diagnosis present

## 2016-11-29 NOTE — Therapy (Addendum)
[place] MAIN [facility] SERVICES [address] [place], [place], [address] Phone: [phone]   Fax:  [phone]  Physical Therapy Evaluation  Patient Details  Name: Nicole Osborn MRN: [id number] Date of Birth: 05-Feb-1971 Referring Provider: Ernestine Conrad   Encounter Date: 11/27/2016      PT End of Session - 12/05/16 1533    Visit Number 1   Number of Visits 12   Date for PT Re-Evaluation 02/19/17   PT Start Time 2993   PT Stop Time 7169   PT Time Calculation (min) 70 min   Activity Tolerance Patient tolerated treatment well;No increased pain   Behavior During Therapy WFL for tasks assessed/performed      Past Medical History:  Diagnosis Date  . Anxiety   . Arthritis   . Depression   . Heartburn   . Hyperlipidemia   . Hypertension   . Kidney stone   . Migraines    without aura  . Obesity   . Thyroid disease     Past Surgical History:  Procedure Laterality Date  . Removal of extra tooth      There were no vitals filed for this visit.       Subjective Assessment - 12/05/16 1519    Subjective 1) Pelvic floor dysfunction:  Pt reports frequent urination for the past 20 years, leakage for the past 1.5 years. Pad wear 3 pads/ day. Frequency: 1x/ hr.  Pt would toilet just in case to avoid the discomfort of having to go but without access or to avoid distraction with the feeling of urge.  Pt is not able to sense the leakage when it occurs. Leakage occurs with coughing, sneezing, and dampens throughout the day and less in the night. Pt also  feels she has leakage after standing up from drying off. Nocturia: 1x/ night. Pt has the urge feeling to toilet multiple times before going to bed and has trouble getting to sleep.  Gynceological Hx: 2 vaginal deliveries, tears and stitches. Hx of falls from horseback riding as a child. Bowel movements: 3x/ day.[place] Stool Type 3-6. Fecal leakage noted. Burning with sexual intercourse.    2) LBP mm  spasms over the last month that occur in the evening or night: muscle contraction feeling.  Pt has not been exercising due the L ankle injury.    3) L ankle pain/injury: Pt reported L ankle swelling 5 years ago, She began PT in August but she broke her L ankle which has healed. Her baseline swelling has returned and the pattern of swelling increases in spring/summer/fall and was reduced in th winter.  Dr. Vickki Muff ordered MRI showing bruising and he suggested resuming PT.  Pt wore a ankle boot 6weeks to 2 months.   4) anxiety occurs with overwhelming details at work, balancing work and family life roles, and finances. Pt uses prayer, church services, faith.            Pertinent History Pt reported L ankle swelling 5 years ago, She began PT in August but she broke her L ankle which has healed. Her baseline swelling has returned and the pattern of swelling increases in spring/summer/fall and was reduced in th winter.  Dr. Vickki Muff ordered MRI showing bruising and he suggested resuming PT.               [facility] PT Assessment - 12/05/16 1519      Assessment   Medical Diagnosis urinary frequency     Precautions  Precautions None     Restrictions   Weight Bearing Restrictions No     Balance Screen   Has the patient fallen in the past 6 months No     Prior Function   Level of Independence Independent     Coordination   Gross Motor Movements are Fluid and Coordinated --  limited diaphragmatic excursion   Fine Motor Movements are Fluid and Coordinated --  abdominal straining with cue for bowel movement     AROM   Overall AROM  --  limited DF in L ankle > R, limited midfoot/forefoot mobility     Strength   Overall Strength Comments Eversion strength on L < R 3/5, 4/5)      Palpation   Spinal mobility increased midback tensions. hypomobility     Bed Mobility   Bed Mobility --  crunch method     Ambulation/Gait   Gait Comments heel striking, narrow shoes limited sole support                    [facility] Adult PT Treatment/Exercise - 12/05/16 1519      Therapeutic Activites    Therapeutic Activities --  see pt instructions     Neuro Re-ed    Neuro Re-ed Details  see pt instructions                PT Education - 12/05/16 1533    Education Details POC, anatomy/ physiology, goals, HEP, ways to decrease load on pelvic floor   Person(s) Educated Patient   Methods Explanation;Demonstration;Tactile cues;Verbal cues;Handout   Comprehension Returned demonstration;Verbalized understanding;Verbal cues required;Tactile cues required             PT Long Term Goals - 11/27/16 1620      PT LONG TERM GOAL #1   Title Pt will decrease her urinary frequency from 1x/ hr to 1x/ 2hr in order to improve QOL.   Time 12   Period Weeks   Status New     PT LONG TERM GOAL #2   Title Pt will decrease her pad wear from 3 / day to < 1 day in order to demo improved pelvic floor function   Time 12   Period Weeks   Status New     PT LONG TERM GOAL #3   Title Pt will decrease her score on PSQI from 71% to < 65% in order to improve quality of sleep    Time 12   Period Weeks   Status New     PT LONG TERM GOAL #4   Title Pt will decreased her score on PFDI from 61% to < 55% in order to demo improved pelvic floor mm strength   Time 12   Period Weeks   Status New               Plan - 12/05/16 1534    Clinical Impression Statement Pt is a 46 yo female who reports urinary/fecal leakage, L ankle pain, and CLBP. These deficits impact her ADLs and QOL. Pt 's presentations include dyscoordination of deep core mm, limited thoracic mobility, limited diaphragmatic excursion, limited foot mobility, ankle weakness, and gait deviations. Following Tx today, pt demo'd proper deep core coordination, breathing for relaxation, and technique for toileting/ out of bed without straining abdominal mm. PT phoned [facility] informing staff that pt had questions re:  cost of sleep study and to communicate with pt.     Rehab Potential Good  Clinical Impairments Affecting Rehab Potential pt has not f/u with her sleep study ordered by her MD, pt also has financial restraints, stress   PT Frequency 1x / week   PT Duration 12 weeks   PT Treatment/Interventions ADLs/Self Care Home Management;Aquatic Therapy;Moist Heat;Functional mobility training;Stair training;Gait training;Neuromuscular re-education;Therapeutic activities;Patient/family education;Therapeutic exercise;Balance training;Scar mobilization;Manual lymph drainage;Manual techniques;Taping;Energy conservation  stress management \   Consulted and Agree with Plan of Care Patient      Patient will benefit from skilled therapeutic intervention in order to improve the following deficits and impairments:  Abnormal gait, Pain, Improper body mechanics, Decreased coordination, Decreased mobility, Increased muscle spasms, Postural dysfunction, Decreased strength, Decreased range of motion, Decreased activity tolerance, Decreased endurance, Decreased balance, Obesity, Increased fascial restricitons, Difficulty walking, Decreased safety awareness  Visit Diagnosis: Other abnormalities of gait and mobility  Other lack of coordination  Stiffness of left foot, not elsewhere classified  Chronic low back pain without sciatica, unspecified back pain laterality  Weakness generalized     Problem List Patient Active Problem List   Diagnosis Date Noted  . Pharyngitis 10/12/2016  . Increased frequency of headaches 04/28/2016  . Urinary frequency 04/23/2016  . Swelling of left ankle joint 04/23/2016  . Hypothyroidism 04/23/2016  . Obesity 04/23/2016  . Rash 04/23/2016  . Missed period 04/23/2016    [name] ,PT, DPT, E-RYT  12/05/2016, 3:48 PM  [place] MAIN [facility] SERVICES [address] [place], [place], [address] Phone: [phone]   Fax:   [phone]  Name: Nicole Osborn MRN: [id number] Date of Birth: Dec 17, 1970

## 2016-12-05 ENCOUNTER — Ambulatory Visit: Payer: BC Managed Care – PPO | Admitting: Physical Therapy

## 2016-12-05 DIAGNOSIS — R531 Weakness: Secondary | ICD-10-CM

## 2016-12-05 DIAGNOSIS — R2689 Other abnormalities of gait and mobility: Secondary | ICD-10-CM | POA: Diagnosis not present

## 2016-12-05 DIAGNOSIS — M545 Low back pain, unspecified: Secondary | ICD-10-CM

## 2016-12-05 DIAGNOSIS — R278 Other lack of coordination: Secondary | ICD-10-CM

## 2016-12-05 DIAGNOSIS — M25675 Stiffness of left foot, not elsewhere classified: Secondary | ICD-10-CM

## 2016-12-05 DIAGNOSIS — G8929 Other chronic pain: Secondary | ICD-10-CM

## 2016-12-05 NOTE — Addendum Note (Signed)
Addended by: [name] on: 12/05/2016 03:54 PM   Modules accepted: Orders

## 2016-12-05 NOTE — Patient Instructions (Addendum)
You are now ready to begin training the deep core muscles system: diaphragm, transverse abdominis, pelvic floor . These muscles must work together as a team.           The key to these exercises to train the brain to coordinate the timing of these muscles and to have them turn on for long periods of time to hold you upright against gravity (especially important if you are on your feet all day).These muscles are postural muscles and play a role stabilizing your spine and bodyweight. By doing these repetitions slowly and correctly instead of doing crunches, you will achieve a flatter belly without a lower pooch. You are also placing your spine in a more neutral position and breathing properly which in turn, decreases your risk for problems related to your pelvic floor, abdominal, and low back such as pelvic organ prolapse, hernias, diastasis recti (separation of superficial muscles), disk herniations, spinal fractures. These exercises set a solid foundation for you to later progress to resistance/ strength training with therabands and weights and return to other typical fitness exercises with a stronger deeper core.   Do level 1 : 10 reps Do level 2: 10 reps (left and right = 1 rep) x 3 sets , 2 x day Do not progress to level 3 for 3-4 weeks. You know you are ready when you do not have any rocking of pelvis nor arching in your back      Follow up with MD on sleep study

## 2016-12-05 NOTE — Therapy (Signed)
[place] MAIN [facility] SERVICES [address] [place], [place], [address] Phone: [phone]   Fax:  [phone]  Physical Therapy Treatment  Patient Details  Name: Nicole Osborn MRN: [id number] Date of Birth: [birth year] Referring Provider: Ernestine Conrad   Encounter Date: 12/05/2016      PT End of Session - 12/05/16 2251    Visit Number 2   Number of Visits 12   Date for PT Re-Evaluation 02/19/17   PT Start Time 2060   PT Stop Time 1710   PT Time Calculation (min) 60 min   Activity Tolerance Patient tolerated treatment well;No increased pain   Behavior During Therapy WFL for tasks assessed/performed      Past Medical History:  Diagnosis Date  . Anxiety   . Arthritis   . Depression   . Heartburn   . Hyperlipidemia   . Hypertension   . Kidney stone   . Migraines    without aura  . Obesity   . Thyroid disease     Past Surgical History:  Procedure Laterality Date  . Removal of extra tooth      There were no vitals filed for this visit.      Subjective Assessment - 12/05/16 1614    Subjective (P)  Pt reported she has noticed that breathing and relaxing has helped to decrease the urge. Pt has also practiced walking on the front of her feet instead of her heels.    Pertinent History (P)  Pt reported L ankle swelling 5 years ago, She began PT in August but she broke her L ankle which has healed. Her baseline swelling has returned and the pattern of swelling increases in spring/summer/fall and was reduced in th winter.  Dr. Vickki Muff ordered MRI showing bruising and he suggested resuming PT.               [facility] PT Assessment - 12/05/16 2243      Observation/Other Assessments   Observations demo'd improved diaphragmatic breathing in supine and seated. able to sense lengthening of pelvic floor with breathing                  Pelvic Floor Special Questions - 12/05/16 2241    Pelvic Floor Internal Exam pt consented verbally  without contraindications   Exam Type Vaginal   Palpation increased tensions and tenderness along puborectalis 5-7 o'clock (decreased post Tx)    Strength good squeeze, good lift, able to hold agaisnt strong resistance  pre Tx: 3/5            [facility] Adult PT Treatment/Exercise - 12/05/16 2249      Neuro Re-ed    Neuro Re-ed Details  see pt instructions     Manual Therapy   Internal Pelvic Floor thiele massage and fascial scar release puborectalis mm B , MET                 PT Education - 12/05/16 2251    Education provided Yes   Education Details HEP   Person(s) Educated Patient   Methods Explanation;Demonstration;Tactile cues;Verbal cues;Handout   Comprehension Returned demonstration;Verbalized understanding             PT Long Term Goals - 12/05/16 2254      PT LONG TERM GOAL #1   Title Pt will decrease her urinary frequency from 1x/ hr to 1x/ 2hr in order to improve QOL.   Time 12   Period Weeks   Status On-going  PT LONG TERM GOAL #2   Title Pt will decrease her pad wear from 3 / day to < 1 day in order to demo improved pelvic floor function   Time 12   Period Weeks   Status On-going     PT LONG TERM GOAL #3   Title Pt will decrease her score on PSQI from 71% to < 65% in order to improve quality of sleep    Time 12   Period Weeks   Status On-going     PT LONG TERM GOAL #4   Title Pt will decreased her score on PFDI from 61% to < 55% in order to demo improved pelvic floor mm strength   Time 12   Period Weeks   Status On-going     PT LONG TERM GOAL #5   Title Pt will demo no pelvic floor / scar tensions/ tenderness across two visits in order to progress to pelvic floor coordinaiton and strengthening.    Time 12   Period Weeks   Status New               Plan - 12/05/16 2252    Clinical Impression Statement Pt achieved proper pelvic floor lengthening and Grade 4 contraction following internal manual Tx that focused on releasing  perineal scar restrictions. Initiated deep core level 2 and L ankle eversion resistance exercise.Pt tolerated without complaints and demo'd correctly.  PT discussed PT communicating to Sleep Study to contact pt about cost of sleep study. Pt reported she received information and feels that she is not going to perform the sleep study at this time due to time and costs. Pt will continue to benefit from skilled PT.    Rehab Potential Good   Clinical Impairments Affecting Rehab Potential pt has not f/u with her sleep study ordered by her MD, pt also has financial restraints, stress   PT Frequency 1x / week   PT Duration 12 weeks   PT Treatment/Interventions ADLs/Self Care Home Management;Aquatic Therapy;Moist Heat;Functional mobility training;Stair training;Gait training;Neuromuscular re-education;Therapeutic activities;Patient/family education;Therapeutic exercise;Balance training;Scar mobilization;Manual lymph drainage;Manual techniques;Taping;Energy conservation  stress management \   Consulted and Agree with Plan of Care Patient      Patient will benefit from skilled therapeutic intervention in order to improve the following deficits and impairments:  Abnormal gait, Pain, Improper body mechanics, Decreased coordination, Decreased mobility, Increased muscle spasms, Postural dysfunction, Decreased strength, Decreased range of motion, Decreased activity tolerance, Decreased endurance, Decreased balance, Obesity, Increased fascial restricitons, Difficulty walking, Decreased safety awareness  Visit Diagnosis: Other abnormalities of gait and mobility  Other lack of coordination  Stiffness of left foot, not elsewhere classified  Chronic low back pain without sciatica, unspecified back pain laterality  Weakness generalized     Problem List Patient Active Problem List   Diagnosis Date Noted  . Pharyngitis 10/12/2016  . Increased frequency of headaches 04/28/2016  . Urinary frequency 04/23/2016   . Swelling of left ankle joint 04/23/2016  . Hypothyroidism 04/23/2016  . Obesity 04/23/2016  . Rash 04/23/2016  . Missed period 04/23/2016    Nicole Osborn ,PT, DPT, E-RYT  12/05/2016, 10:58 PM  [place] MAIN [facility] SERVICES [address] [place], [place], [address] Phone: [phone]   Fax:  [phone]  Name: KYRSTYN GREEAR MRN: [id number] Date of Birth: Oct 22, 1970

## 2016-12-05 NOTE — Patient Instructions (Signed)
You are now ready to begin training the deep core muscles system: diaphragm, transverse abdominis, pelvic floor . These muscles must work together as a team.           The key to these exercises to train the brain to coordinate the timing of these muscles and to have them turn on for long periods of time to hold you upright against gravity (especially important if you are on your feet all day).These muscles are postural muscles and play a role stabilizing your spine and bodyweight. By doing these repetitions slowly and correctly instead of doing crunches, you will achieve a flatter belly without a lower pooch. You are also placing your spine in a more neutral position and breathing properly which in turn, decreases your risk for problems related to your pelvic floor, abdominal, and low back such as pelvic organ prolapse, hernias, diastasis recti (separation of superficial muscles), disk herniations, spinal fractures. These exercises set a solid foundation for you to later progress to resistance/ strength training with therabands and weights and return to other typical fitness exercises with a stronger deeper core.   Do level 1 : 10 reps Do level 2: 10 reps (left and right = 1 rep) x 3 sets , 2 x day Do not progress to level 3 for 3-4 weeks. You know you are ready when you do not have any rocking of pelvis nor arching in your back    (Handout)    __________  Seated breathing with pelvic floor lengthening    ___________  L Ankle eversion 10 reps x 3 / x 2 day  Red band

## 2016-12-07 ENCOUNTER — Encounter: Payer: Self-pay | Admitting: Family Medicine

## 2016-12-07 ENCOUNTER — Ambulatory Visit (INDEPENDENT_AMBULATORY_CARE_PROVIDER_SITE_OTHER): Payer: BC Managed Care – PPO | Admitting: Family Medicine

## 2016-12-07 DIAGNOSIS — R911 Solitary pulmonary nodule: Secondary | ICD-10-CM | POA: Diagnosis not present

## 2016-12-07 DIAGNOSIS — Z6836 Body mass index (BMI) 36.0-36.9, adult: Secondary | ICD-10-CM

## 2016-12-07 DIAGNOSIS — F419 Anxiety disorder, unspecified: Secondary | ICD-10-CM

## 2016-12-07 DIAGNOSIS — E782 Mixed hyperlipidemia: Secondary | ICD-10-CM | POA: Diagnosis not present

## 2016-12-07 DIAGNOSIS — E6609 Other obesity due to excess calories: Secondary | ICD-10-CM

## 2016-12-07 NOTE — Progress Notes (Signed)
BP 122/82   Pulse 90   Temp 97.8 F (36.6 C) (Oral)   Resp 14   Wt 215 lb 4.8 oz (97.7 kg)   SpO2 96%   BMI 36.96 kg/m    Subjective:    Patient ID: Nicole Osborn, female    DOB: 12-14-1970, 46 y.o.   MRN: [id number]  HPI: Nicole Osborn is a 46 y.o. female  Chief Complaint  Patient presents with  . Results    discuss xray   She was here in January; she has the flu and was sick for a week; she started Tamiflu and that night, her fever broke She has a spot on the right upper lobe called a nodule She went to the walk-in clinic last year when she was diagnosed with pneumonia She went back with bronchitis She thought that she might have had a spot on her lungs from pneumonia, but she isn't sure; she brought her xray I had recommended that she get a CT scan and patient is concerned about the cost; her son is starting college and she is doing PT for her ankle She is seeing therapist for bladder too A lot of expenses and she just wants to make sure it's needed She was in [place] clinic in May; previous pneumonia and had bronchitis; had CXR done  Never smoker Was seen at [facility], December 15, 2015; Dr. Wynetta Emery No weight loss No nights sweats No residual cough No SHOB Does sometimes feel tightness which she thinks is anxiety; was having panic attacvks; went to see Dr. Nicolasa Ducking, put her on an antidepressant; thought maybe ADHD; referred to Dr. Georgeanne Nim in [facility]; did testing; no ADHD, some sx but more depression, not true ADHD  Tortuous aorta  High cholesterol Lab Results  Component Value Date   CHOL 211 (H) 04/23/2016   HDL 39 (L) 04/23/2016   [place] 123 04/23/2016   TRIG 244 (H) 04/23/2016   CHOLHDL 5.4 (H) 04/23/2016   She sees Dr. Manuella Ghazi for her migraines; he wanted her to do a sleep study, but she doesn't think she has sleep apnea  Depression screen [facility] 2/9 12/07/2016 10/12/2016 04/23/2016  Decreased Interest 0 0 0  Down, Depressed, Hopeless 0 1 0  PHQ - 2 Score 0 1 0    Relevant past medical, surgical, family and social history reviewed Past Medical History:  Diagnosis Date  . Anxiety   . Arthritis   . Depression   . Heartburn   . Hyperlipidemia   . Hypertension   . Kidney stone   . Migraines    without aura  . Obesity   . Thyroid disease    Family History  Problem Relation Age of Onset  . Depression Mother   . Arthritis Mother   . Stroke Father   . Hypertension Father   . Breast cancer Paternal Aunt 75  . Emphysema Maternal Grandmother   . Emphysema Maternal Grandfather   . Dementia Paternal Grandmother   . Asthma Son   . Eczema Son   . Kidney disease Neg Hx   . Bladder Cancer Neg Hx    Social History  Substance Use Topics  . Smoking status: Never Smoker  . Smokeless tobacco: Never Used  . Alcohol use No   Interim medical history since last visit reviewed. Allergies and medications reviewed  Review of Systems Per HPI unless specifically indicated above     Objective:    BP 122/82   Pulse 90   Temp 97.8  F (36.6 C) (Oral)   Resp 14   Wt 215 lb 4.8 oz (97.7 kg)   SpO2 96%   BMI 36.96 kg/m   Wt Readings from Last 3 Encounters:  12/07/16 215 lb 4.8 oz (97.7 kg)  10/12/16 215 lb (97.5 kg)  08/09/16 216 lb 3.2 oz (98.1 kg)    Physical Exam  Constitutional: She appears well-developed and well-nourished.  Obese, weight stable  HENT:  Mouth/Throat: Mucous membranes are normal.  Eyes: EOM are normal. No scleral icterus.  Cardiovascular: Normal rate and regular rhythm.   Pulmonary/Chest: Effort normal and breath sounds normal.  Psychiatric: She has a normal mood and affect. Her behavior is normal.   Results for orders placed or performed in visit on 10/12/16  Culture, Group A Strep  Result Value Ref Range   Organism ID, Bacteria      Normal Upper Respiratory Flora No Beta Hemolytic Streptococci Isolated   POCT Influenza A/B  Result Value Ref Range   Influenza A, POC Negative Negative   Influenza B, POC  Negative Negative  POCT rapid strep A  Result Value Ref Range   Rapid Strep A Screen Negative Negative      Assessment & Plan:   Problem List Items Addressed This Visit      Other   Obesity    Weight loss would help lipid panel      Lung nodule    6 mm nodule noted RUL last CXR; recommended CT of chest to evaluate; patient will check around to see where she can get this done for least expense, then call us back to schedule/order; discussed ddx with her      Hyperlipidemia    High TG, low HDL; encouraged weight loss, healthy eating      Anxiety    Supportive listening; suggested relaxation response, counsling          Follow up plan: No Follow-up on file.  An after-visit summary was printed and given to the patient at [place].  Please see the patient instructions which may contain other information and recommendations beyond what is mentioned above in the assessment and plan.

## 2016-12-07 NOTE — Patient Instructions (Addendum)
Request an actual copy of the radiology report from Jan 30, 2016 from [place] (chest xray) We'll see if they are able to detect any sort of nodule on the previous exam I am still going to recommend a non-contrast chest CT for evaluation of a lung nodule, 6 mm RUL (right upper lobe)  Try to limit saturated fats in your diet (bologna, hot dogs, barbeque, cheeseburgers, hamburgers, steak, bacon, sausage, cheese, etc.) and get more fresh fruits, vegetables, and whole grains Return in 3 months for fasting labs only if desired, I am here as a resource for you  12 Ways to Curb Anxiety  ?Anxiety is normal human sensation. It is what helped our ancestors survive the pitfalls of the wilderness. Anxiety is defined as experiencing worry or nervousness about an imminent event or something with an uncertain outcome. It is a feeling experienced by most people at some point in their lives. Anxiety can be triggered by a very personal issue, such as the illness of a loved one, or an event of global proportions, such as a refugee crisis. Some of the symptoms of anxiety are:  Feeling restless.  Having a feeling of impending danger.  Increased heart rate.  Rapid breathing. Sweating.  Shaking.  Weakness or feeling tired.  Difficulty concentrating on anything except the current worry.  Insomnia.  Stomach or bowel problems. What can we do about anxiety we may be feeling? There are many techniques to help manage stress and relax. Here are 12 ways you can reduce your anxiety almost immediately: 1. Turn off the constant feed of information. Take a social media sabbatical. Studies have shown that social media directly contributes to social anxiety.  2. Monitor your television viewing habits. Are you watching shows that are also contributing to your anxiety, such as 24-hour news stations? Try watching something else, or better yet, nothing at all. Instead, listen to music, read an inspirational book or practice a  hobby. 3. Eat nutritious meals. Also, don't skip meals and keep healthful snacks on hand. Hunger and poor diet contributes to feeling anxious. 4. Sleep. Sleeping on a regular schedule for at least seven to eight hours a night will do wonders for your outlook when you are awake. 5. Exercise. Regular exercise will help rid your body of that anxious energy and help you get more restful sleep. 6. Try deep (diaphragmatic) breathing. Inhale slowly through your nose for five seconds and exhale through your mouth. 7. Practice acceptance and gratitude. When anxiety hits, accept that there are things out of your control that shouldn't be of immediate concern.  8. Seek out humor. When anxiety strikes, watch a funny video, read jokes or call a friend who makes you laugh. Laughter is healing for our bodies and releases endorphins that are calming. 9. Stay positive. Take the effort to replace negative thoughts with positive ones. Try to see a stressful situation in a positive light. Try to come up with solutions rather than dwelling on the problem. 10. Figure out what triggers your anxiety. Keep a journal and make note of anxious moments and the events surrounding them. This will help you identify triggers you can avoid or even eliminate. 11. Talk to someone. Let a trusted friend, family member or even trained professional know that you are feeling overwhelmed and anxious. Verbalize what you are feeling and why.  12. Volunteer. If your anxiety is triggered by a crisis on a large scale, become an advocate and work to resolve the problem that is  causing you unease. Anxiety is often unwelcome and can become overwhelming. If not kept in check, it can become a disorder that could require medical treatment. However, if you take the time to care for yourself and avoid the triggers that make you anxious, you will be able to find moments of relaxation and clarity that make your life much more enjoyable.

## 2016-12-11 ENCOUNTER — Telehealth: Payer: Self-pay | Admitting: Family Medicine

## 2016-12-11 DIAGNOSIS — R911 Solitary pulmonary nodule: Secondary | ICD-10-CM

## 2016-12-11 NOTE — Telephone Encounter (Signed)
Pt was seen on this past Friday and stated that you are asking for her to do a CT of lung nodule. States that she [facility] [place]. States that she did not get the the CT done in January and that one was with contrast and the one your ordering now is w/o contrast. Needing clarification on this please. [phone] it is okay to leave detailed message

## 2016-12-11 NOTE — Assessment & Plan Note (Signed)
Order chest CT

## 2016-12-11 NOTE — Telephone Encounter (Signed)
I put in order for chest CT without contrast; please order at preferred location; thank you

## 2016-12-12 ENCOUNTER — Ambulatory Visit: Payer: BC Managed Care – PPO | Admitting: Physical Therapy

## 2016-12-12 DIAGNOSIS — M545 Low back pain, unspecified: Secondary | ICD-10-CM

## 2016-12-12 DIAGNOSIS — G8929 Other chronic pain: Secondary | ICD-10-CM

## 2016-12-12 DIAGNOSIS — R2689 Other abnormalities of gait and mobility: Secondary | ICD-10-CM | POA: Diagnosis not present

## 2016-12-12 DIAGNOSIS — R278 Other lack of coordination: Secondary | ICD-10-CM

## 2016-12-12 DIAGNOSIS — M25675 Stiffness of left foot, not elsewhere classified: Secondary | ICD-10-CM

## 2016-12-12 DIAGNOSIS — R531 Weakness: Secondary | ICD-10-CM

## 2016-12-12 NOTE — Patient Instructions (Addendum)
Stretches for pelvic floor: Laying on your back with a towel under opposite thigh of the thigh you want to stretch Cross __  ankle over __  Thigh  Inhale, feel pelvic floor lengthen/diaphragm expand left and right laterally Exhale, bring thighs closer to chest by pulling towel with hands. Keep shoulders and neck down on the pillow and relaxed.   3 breaths     __________  Nicole Osborn your toes   Curl toes under, spread wide,  Press ankle down and up   Circle ankles, clockwise and counterclockwise with toes spread      3 reps of each movement  __________ Standing posture  Unlock knees,   more weight on ballmounds. Heels

## 2016-12-12 NOTE — Telephone Encounter (Signed)
The required information has been sent to:  [facility]   Phone [phone] Fax [phone] Billing [phone]  [address] [place], [place] [address]

## 2016-12-12 NOTE — Therapy (Signed)
[place] MAIN [facility] SERVICES [address] [place], [place], [address] Phone: [phone]   Fax:  [phone]  Physical Therapy Treatment  Patient Details  Name: Nicole Osborn MRN: [id number] Date of Birth: [birth year] Referring Provider: Ernestine Conrad   Encounter Date: 12/12/2016      PT End of Session - 12/12/16 2341    Visit Number 3   Number of Visits 12   Date for PT Re-Evaluation 02/19/17   PT Start Time 6503   PT Stop Time 5465   PT Time Calculation (min) 60 min   Activity Tolerance Patient tolerated treatment well;No increased pain   Behavior During Therapy WFL for tasks assessed/performed      Past Medical History:  Diagnosis Date  . Anxiety   . Arthritis   . Depression   . Heartburn   . Hyperlipidemia   . Hypertension   . Kidney stone   . Migraines    without aura  . Obesity   . Thyroid disease     Past Surgical History:  Procedure Laterality Date  . Removal of extra tooth      There were no vitals filed for this visit.      Subjective Assessment - 12/12/16 1621    Subjective Pt feels her L ankle mobility has improved by 20% and her urge also decreased a little. The anxiety with urge is also less    Pertinent History Pt reported L ankle swelling 5 years ago, She began PT in August but she broke her L ankle which has healed. Her baseline swelling has returned and the pattern of swelling increases in spring/summer/fall and was reduced in th winter.  Dr. Vickki Muff ordered MRI showing bruising and he suggested resuming PT.               [facility] PT Assessment - 12/12/16 2340      Posture/Postural Control   Posture Comments hyperextended knees, anterior pelvic tilt                   Pelvic Floor Special Questions - 12/12/16 2340    Pelvic Floor Internal Exam pt consented verbally without contraindications   Exam Type Vaginal   Palpation increased tensions and tenderness along L obt int > R (decreased post  Tx)    Strength good squeeze, good lift, able to hold agaisnt strong resistance           [facility] Adult PT Treatment/Exercise - 12/12/16 2340      Neuro Re-ed    Neuro Re-ed Details  see pt instructions     Manual Therapy   Internal Pelvic Floor thiele massage, STM obt int , MET                      PT Long Term Goals - 12/05/16 2254      PT LONG TERM GOAL #1   Title Pt will decrease her urinary frequency from 1x/ hr to 1x/ 2hr in order to improve QOL.   Time 12   Period Weeks   Status On-going     PT LONG TERM GOAL #2   Title Pt will decrease her pad wear from 3 / day to < 1 day in order to demo improved pelvic floor function   Time 12   Period Weeks   Status On-going     PT LONG TERM GOAL #3   Title Pt will decrease her score on PSQI from 71% to <  65% in order to improve quality of sleep    Time 12   Period Weeks   Status On-going     PT LONG TERM GOAL #4   Title Pt will decreased her score on PFDI from 61% to < 55% in order to demo improved pelvic floor mm strength   Time 12   Period Weeks   Status On-going     PT LONG TERM GOAL #5   Title Pt will demo no pelvic floor / scar tensions/ tenderness across two visits in order to progress to pelvic floor coordinaiton and strengthening.    Time 12   Period Weeks   Status New               Plan - 12/12/16 2343    Clinical Impression Statement Pt demo'd improved pelvic floor lengthening following release of obturator internus bilaterally with internal manual Tx today. Addressed standing posture with less hyperextended knees and finding pelvic pelvic neutral in order to decrease heel pain and pelvic floor tightness. Pt demo'd correctly. Pt required correction cues for deep core level 2. Added pelvic floor stretches. Pt continues to benefit from skilled PT.    Rehab Potential Good   Clinical Impairments Affecting Rehab Potential pt has not f/u with her sleep study ordered by her MD, pt also has  financial restraints, stress   PT Frequency 1x / week   PT Duration 12 weeks   PT Treatment/Interventions ADLs/Self Care Home Management;Aquatic Therapy;Moist Heat;Functional mobility training;Stair training;Gait training;Neuromuscular re-education;Therapeutic activities;Patient/family education;Therapeutic exercise;Balance training;Scar mobilization;Manual lymph drainage;Manual techniques;Taping;Energy conservation  stress management \   Consulted and Agree with Plan of Care Patient      Patient will benefit from skilled therapeutic intervention in order to improve the following deficits and impairments:  Abnormal gait, Pain, Improper body mechanics, Decreased coordination, Decreased mobility, Increased muscle spasms, Postural dysfunction, Decreased strength, Decreased range of motion, Decreased activity tolerance, Decreased endurance, Decreased balance, Obesity, Increased fascial restricitons, Difficulty walking, Decreased safety awareness  Visit Diagnosis: Other abnormalities of gait and mobility  Other lack of coordination  Stiffness of left foot, not elsewhere classified  Chronic low back pain without sciatica, unspecified back pain laterality  Weakness generalized     Problem List Patient Active Problem List   Diagnosis Date Noted  . Lung nodule 12/11/2016  . Pharyngitis 10/12/2016  . Increased frequency of headaches 04/28/2016  . Urinary frequency 04/23/2016  . Swelling of left ankle joint 04/23/2016  . Hypothyroidism 04/23/2016  . Obesity 04/23/2016  . Rash 04/23/2016  . Missed period 04/23/2016    Yeung,Shin Yiing ,PT, DPT, E-RYT  12/12/2016, 11:45 PM  [facility] [address] [place], [place], [address] Phone: [phone]   Fax:  [phone]  Name: Nicole Osborn MRN: [id number] Date of Birth: 05/24/1971   

## 2016-12-13 ENCOUNTER — Telehealth: Payer: Self-pay

## 2016-12-13 ENCOUNTER — Other Ambulatory Visit: Payer: Self-pay

## 2016-12-13 NOTE — Telephone Encounter (Signed)
CT chest without contrast  Spoke to Hollyanne with [facility] @ [phone] at 11:40am.  Josem Kaufmann #[id number] Valid 12/13/16 - 01/11/17  This information was faxed to [facility] at [phone]. Confirmation was received.  Patient was informed of this and was given their number [phone].

## 2016-12-13 NOTE — Addendum Note (Signed)
Addended by: Saunders Glance A on: 12/13/2016 11:14 AM   Modules accepted: Orders

## 2016-12-21 ENCOUNTER — Telehealth: Payer: Self-pay | Admitting: Family Medicine

## 2016-12-21 NOTE — Telephone Encounter (Signed)
I spoke with patient; read her the report line by line I truly believe they meant to type "No" pulmonary nodule instead of "new" pulmonary nodule in the impression based on the rest of the report I want a corrected report Reassurance given Asked patient to call if she has not heard back by Wednesday, as we wait for [facility] to send corrected report

## 2016-12-23 DIAGNOSIS — F419 Anxiety disorder, unspecified: Secondary | ICD-10-CM | POA: Insufficient documentation

## 2016-12-23 DIAGNOSIS — E785 Hyperlipidemia, unspecified: Secondary | ICD-10-CM | POA: Insufficient documentation

## 2016-12-23 NOTE — Assessment & Plan Note (Signed)
High TG, low HDL; encouraged weight loss, healthy eating

## 2016-12-23 NOTE — Assessment & Plan Note (Signed)
6 mm nodule noted RUL last CXR; recommended CT of chest to evaluate; patient will check around to see where she can get this done for least expense, then call us back to schedule/order; discussed ddx with her

## 2016-12-23 NOTE — Assessment & Plan Note (Signed)
Weight loss would help lipid panel

## 2016-12-23 NOTE — Assessment & Plan Note (Signed)
Supportive listening; suggested relaxation response, counsling

## 2016-12-25 ENCOUNTER — Ambulatory Visit: Payer: BC Managed Care – PPO | Attending: Urology | Admitting: Physical Therapy

## 2016-12-25 ENCOUNTER — Telehealth: Payer: Self-pay | Admitting: Family Medicine

## 2016-12-25 DIAGNOSIS — G8929 Other chronic pain: Secondary | ICD-10-CM | POA: Insufficient documentation

## 2016-12-25 DIAGNOSIS — M25675 Stiffness of left foot, not elsewhere classified: Secondary | ICD-10-CM | POA: Diagnosis present

## 2016-12-25 DIAGNOSIS — R278 Other lack of coordination: Secondary | ICD-10-CM | POA: Diagnosis present

## 2016-12-25 DIAGNOSIS — R531 Weakness: Secondary | ICD-10-CM | POA: Insufficient documentation

## 2016-12-25 DIAGNOSIS — R2689 Other abnormalities of gait and mobility: Secondary | ICD-10-CM | POA: Diagnosis not present

## 2016-12-25 DIAGNOSIS — M545 Low back pain, unspecified: Secondary | ICD-10-CM

## 2016-12-25 NOTE — Telephone Encounter (Signed)
Patient notified

## 2016-12-25 NOTE — Telephone Encounter (Signed)
Please let patient know that the addendum on the chest CT came back NO pulmonary nodule; you can read her the top of the report; that's great news; thank you

## 2016-12-26 NOTE — Therapy (Signed)
[place] MAIN [facility] SERVICES [address] [place], [place], [address] Phone: [phone]   Fax:  [phone]  Physical Therapy Treatment  Patient Details  Name: Nicole Osborn MRN: [id number] Date of Birth: 16-Feb-1971 Referring Provider: Ernestine Conrad   Encounter Date: 12/25/2016      PT End of Session - 12/26/16 2204    Visit Number 4   Number of Visits 12   Date for PT Re-Evaluation 02/19/17   PT Start Time 8550   PT Stop Time 1586   PT Time Calculation (min) 31 min   Activity Tolerance Patient tolerated treatment well;No increased pain   Behavior During Therapy WFL for tasks assessed/performed      Past Medical History:  Diagnosis Date  . Anxiety   . Arthritis   . Depression   . Heartburn   . Hyperlipidemia   . Hypertension   . Kidney stone   . Migraines    without aura  . Obesity   . Thyroid disease     Past Surgical History:  Procedure Laterality Date  . Removal of extra tooth      There were no vitals filed for this visit.      Subjective Assessment - 12/25/16 1635    Subjective During warmer temperature, pt's ankle swells. Ptreports her previous HEP has been excerberating the pain she had in her hip / knee (L) that was present prior to PT.    Pertinent History Pt reported L ankle swelling 5 years ago, She began PT in August but she broke her L ankle which has healed. Her baseline swelling has returned and the pattern of swelling increases in spring/summer/fall and was reduced in th winter.  Dr. Vickki Muff ordered MRI showing bruising and he suggested resuming PT.               [facility] PT Assessment - 12/26/16 2151      Figure 8 Edema   Figure 8 - Right  54 cm    Figure 8 - Left  56 cm     Coordination   Gross Motor Movements are Fluid and Coordinated --  required cues for correct form on previous HEP     Palpation   Palpation comment increased swelling the size of quater size area posterior to lateral malleoli     minor tensions/ tenderness decreased post Tx     Ambulation/Gait   Gait Comments less pain in heel post Tx                      [facility] Adult PT Treatment/Exercise - 12/26/16 2158      Manual Therapy   Manual therapy comments STM along superior/inferior peroneal retinaculum    Kinesiotex Edema     Kinesiotix   Edema circumferential strips behind lateral malleoli                      PT Long Term Goals - 12/25/16 1655      PT LONG TERM GOAL #1   Title Pt will decrease her urinary frequency from 1x/ hr to 1x/ 2hr in order to improve QOL.   Time 12   Period Weeks   Status Partially Met     PT LONG TERM GOAL #2   Title Pt will decrease her pad wear from 3 / day to < 1 day in order to demo improved pelvic floor function   Time 12   Period Weeks  Status On-going     PT LONG TERM GOAL #3   Title Pt will decrease her score on PSQI from 71% to < 65% in order to improve quality of sleep    Time 12   Period Weeks   Status On-going     PT LONG TERM GOAL #4   Title Pt will decreased her score on PFDI from 61% to < 55% in order to demo improved pelvic floor mm strength   Time 12   Period Weeks   Status On-going     PT LONG TERM GOAL #5   Title Pt will demo no pelvic floor / scar tensions/ tenderness across two visits in order to progress to pelvic floor coordinaiton and strengthening.    Time 12   Period Weeks   Status On-going     Additional Long Term Goals   Additional Long Term Goals Yes     PT LONG TERM GOAL #6   Title Pt will demo decreased swelling in her L ankle from 56 cm (figure 8 ) to 54 cm ([place]) in order to demo improved management    Time 12   Period Weeks   Status New               Plan - 12/26/16 2204    Clinical Impression Statement Pt required cues to correct previous HEP. Pt's L ankle pain decreased post Tx which included manual Tx and kinesiotaping to decrease edema. Pt continues to benefit from skilled PT. Session  was abbreviated because pt arrived 30 min late to session.    Rehab Potential Good   Clinical Impairments Affecting Rehab Potential pt has not f/u with her sleep study ordered by her MD, pt also has financial restraints, stress   PT Frequency 1x / week   PT Duration 12 weeks   PT Treatment/Interventions ADLs/Self Care Home Management;Aquatic Therapy;Moist Heat;Functional mobility training;Stair training;Gait training;Neuromuscular re-education;Therapeutic activities;Patient/family education;Therapeutic exercise;Balance training;Scar mobilization;Manual lymph drainage;Manual techniques;Taping;Energy conservation  stress management \   Consulted and Agree with Plan of Care Patient      Patient will benefit from skilled therapeutic intervention in order to improve the following deficits and impairments:  Abnormal gait, Pain, Improper body mechanics, Decreased coordination, Decreased mobility, Increased muscle spasms, Postural dysfunction, Decreased strength, Decreased range of motion, Decreased activity tolerance, Decreased endurance, Decreased balance, Obesity, Increased fascial restricitons, Difficulty walking, Decreased safety awareness  Visit Diagnosis: Other abnormalities of gait and mobility  Other lack of coordination  Stiffness of left foot, not elsewhere classified  Chronic low back pain without sciatica, unspecified back pain laterality  Weakness generalized     Problem List Patient Active Problem List   Diagnosis Date Noted  . Hyperlipidemia 12/23/2016  . Anxiety 12/23/2016  . Lung nodule 12/11/2016  . Pharyngitis 10/12/2016  . Increased frequency of headaches 04/28/2016  . Urinary frequency 04/23/2016  . Swelling of left ankle joint 04/23/2016  . Hypothyroidism 04/23/2016  . Obesity 04/23/2016  . Rash 04/23/2016  . Missed period 04/23/2016    Nicole Osborn ,PT, DPT, E-RYT  12/26/2016, 10:09 PM  [place] MAIN [facility]  SERVICES [address] [place], [place], [address] Phone: [phone]   Fax:  [phone]  Name: Nicole Osborn MRN: [id number] Date of Birth: 14-Apr-1971

## 2017-01-09 ENCOUNTER — Ambulatory Visit: Payer: BC Managed Care – PPO | Admitting: Physical Therapy

## 2017-01-09 DIAGNOSIS — M25675 Stiffness of left foot, not elsewhere classified: Secondary | ICD-10-CM

## 2017-01-09 DIAGNOSIS — M545 Low back pain: Secondary | ICD-10-CM

## 2017-01-09 DIAGNOSIS — R278 Other lack of coordination: Secondary | ICD-10-CM

## 2017-01-09 DIAGNOSIS — R531 Weakness: Secondary | ICD-10-CM

## 2017-01-09 DIAGNOSIS — R2689 Other abnormalities of gait and mobility: Secondary | ICD-10-CM

## 2017-01-09 DIAGNOSIS — G8929 Other chronic pain: Secondary | ICD-10-CM

## 2017-01-09 NOTE — Patient Instructions (Addendum)
Breathing and pelvic floor coordination  (deep core level 1 before doing level 2)   Practice ribcage expansion on toilet to completely eliminate bowels    Complete 3 days of food diary   Tennis shoes:  6 min  of walking in house for cardio  X 2, 20 min of walking on weekends  X 2

## 2017-01-10 NOTE — Therapy (Signed)
[place] MAIN [facility] SERVICES [address] [place], [place], [address] Phone: [phone]   Fax:  [phone]  Physical Therapy Treatment [place] [facility] Note  Patient Details  Name: Nicole Osborn MRN: [id number] Date of Birth: 1970-12-31 Referring Provider: Ernestine Conrad   Encounter Date: 01/09/2017      PT End of Session - 01/09/17 1700    Visit Number 5   Number of Visits 12   Date for PT Re-Evaluation 02/19/17   PT Start Time 9030   PT Stop Time 1700   PT Time Calculation (min) 45 min   Activity Tolerance Patient tolerated treatment well;No increased pain   Behavior During Therapy WFL for tasks assessed/performed      Past Medical History:  Diagnosis Date  . Anxiety   . Arthritis   . Depression   . Heartburn   . Hyperlipidemia   . Hypertension   . Kidney stone   . Migraines    without aura  . Obesity   . Thyroid disease     Past Surgical History:  Procedure Laterality Date  . Removal of extra tooth      There were no vitals filed for this visit.      Subjective Assessment - 01/10/17 2336    Subjective Pt reports her L ankle has gotten better by 30% and was able to walk the [facility] for a couple of hours. Pt's L knee was bother her on stairs. Pt states her pads are not as wet  but still wearing 2-3 / day. Pt also has been able shop at 2-3 stores  before going to the bathroom.  Today pt has a migraine and had vomited this morning. Theses migrain and vomitting Sx have a problem since she was 46 yo.  Pt states she has noticed she needed to push up something into her rectum after her bowel movements for the past 5-6 months. Pt has had this issue since her 2nd child was born 79 years ago but she has noticed it more in the past months.    Pertinent History Pt reported L ankle swelling 5 years ago, She began PT in August but she broke her L ankle which has healed. Her baseline swelling has returned and the pattern of  swelling increases in spring/summer/fall and was reduced in th winter.  Dr. Vickki Muff ordered MRI showing bruising and he suggested resuming PT.                         Pelvic Floor Special Questions - 01/10/17 2336    Pelvic Floor Internal Exam pt consented verbally without contraindications   Exam Type Rectal   Palpation noted external and internal hemmorhoids, dyscoordination of contraction/ lengthening, (post Tx: more coordination  and sequential contraction . required cues for increased diaphragmatic breathing)    Strength fair squeeze, definite lift           [facility] Adult PT Treatment/Exercise - 01/10/17 2336      Therapeutic Activites    Therapeutic Activities --  proper pelvic floor coordination with bowel movement ( towe     Neuro Re-ed    Neuro Re-ed Details  pelvic floor elngthening with diaphragmatic breathing. pelvic floor lengthening and simulation of bowel movement posture with breathing to elicit proper coordination                PT Education - 01/09/17 1700    Education provided Yes  Education Details HEP    Person(s) Educated Patient   Methods Explanation;Demonstration;Tactile cues;Verbal cues;Handout   Comprehension Returned demonstration;Verbalized understanding             PT Long Term Goals - 01/09/17 1619      PT LONG TERM GOAL #1   Title Pt will decrease her urinary frequency from 1x/ hr to 1x/ 2hr in order to improve QOL.   Time 12   Period Weeks   Status Achieved     PT LONG TERM GOAL #2   Title Pt will decrease her pad wear from 3 / day to < 2 day in order to demo improved pelvic floor function   Time 12   Period Weeks   Status Achieved     PT LONG TERM GOAL #3   Title Pt will decrease her score on PSQI from 71% to < 65% in order to improve quality of sleep    Time 12   Period Weeks   Status On-going     PT LONG TERM GOAL #4   Title Pt will decreased her score on PFDI from 61% to < 55% in order to demo improved  pelvic floor mm strength   Time 12   Period Weeks   Status On-going     PT LONG TERM GOAL #5   Title Pt will demo no pelvic floor / scar tensions/ tenderness across two visits in order to progress to pelvic floor coordinaiton and strengthening.    Time 12   Period Weeks   Status On-going     PT LONG TERM GOAL #6   Title Pt will demo decreased swelling in her L ankle from 56 cm (figure 8 ) to 54 cm (Rankle) in order to demo improved management    Time 12   Period Weeks   Status On-going               Plan - 01/10/17 2343    Clinical Impression Statement Pt's urinary frequency, urgency, and leakage have improved. Her L heel has also improved. Pt has been able to shop at more department stores without needing to find a toilet. Currently working on bowel function. Today, pt showed dyscoordination of her posterior pelvic floor mm which explains pt's report of incomplete emptying of bowel movements. Noted external and internal hemorrhoids by observation and palpation. Cued for increased inhalation with diaphragmatic excursion to elicit optimal pelvic floor lengthening. Pt demo'd correctly with practice in functional position. Pt continues to benefit from skilled PT. Pt was recommended to complete a food diary to address stool consistency issue.       Rehab Potential Good   Clinical Impairments Affecting Rehab Potential pt has not f/u with her sleep study ordered by her MD, pt also has financial restraints, stress   PT Frequency 1x / week   PT Duration 12 weeks   PT Treatment/Interventions ADLs/Self Care Home Management;Aquatic Therapy;Moist Heat;Functional mobility training;Stair training;Gait training;Neuromuscular re-education;Therapeutic activities;Patient/family education;Therapeutic exercise;Balance training;Scar mobilization;Manual lymph drainage;Manual techniques;Taping;Energy conservation   Consulted and Agree with Plan of Care Patient      Patient will benefit from skilled  therapeutic intervention in order to improve the following deficits and impairments:  Abnormal gait, Pain, Improper body mechanics, Decreased coordination, Decreased mobility, Increased muscle spasms, Postural dysfunction, Decreased strength, Decreased range of motion, Decreased activity tolerance, Decreased endurance, Decreased balance, Obesity, Increased fascial restricitons, Difficulty walking, Decreased safety awareness  Visit Diagnosis: Other abnormalities of gait and mobility  Other lack of coordination  Stiffness of left foot, not elsewhere classified  Chronic low back pain without sciatica, unspecified back pain laterality  Weakness generalized     Problem List Patient Active Problem List   Diagnosis Date Noted  . Hyperlipidemia 12/23/2016  . Anxiety 12/23/2016  . Lung nodule 12/11/2016  . Pharyngitis 10/12/2016  . Increased frequency of headaches 04/28/2016  . Urinary frequency 04/23/2016  . Swelling of left ankle joint 04/23/2016  . Hypothyroidism 04/23/2016  . Obesity 04/23/2016  . Rash 04/23/2016  . Missed period 04/23/2016    [name] ,PT, DPT, E-RYT  01/10/2017, 11:51 PM  [place] MAIN [facility] SERVICES [address] [place], [place], [address] Phone: [phone]   Fax:  [phone]  Name: Nicole Osborn MRN: [id number] Date of Birth: May 13, 1971

## 2017-01-23 ENCOUNTER — Ambulatory Visit: Payer: BC Managed Care – PPO | Attending: Urology | Admitting: Physical Therapy

## 2017-01-23 DIAGNOSIS — M545 Low back pain: Secondary | ICD-10-CM | POA: Insufficient documentation

## 2017-01-23 DIAGNOSIS — R531 Weakness: Secondary | ICD-10-CM | POA: Insufficient documentation

## 2017-01-23 DIAGNOSIS — G8929 Other chronic pain: Secondary | ICD-10-CM

## 2017-01-23 DIAGNOSIS — R2689 Other abnormalities of gait and mobility: Secondary | ICD-10-CM | POA: Diagnosis not present

## 2017-01-23 DIAGNOSIS — R278 Other lack of coordination: Secondary | ICD-10-CM | POA: Insufficient documentation

## 2017-01-23 DIAGNOSIS — M25675 Stiffness of left foot, not elsewhere classified: Secondary | ICD-10-CM | POA: Diagnosis present

## 2017-01-23 NOTE — Therapy (Signed)
[place] MAIN [facility] SERVICES [address] [place], [place], [address] Phone: [phone]   Fax:  [phone]  Physical Therapy Treatment  Patient Details  Name: Nicole Osborn MRN: [id number] Date of Birth: 08/03/1971 Referring Provider: Ernestine Conrad   Encounter Date: 01/23/2017      PT End of Session - 01/23/17 1708    Visit Number 6   Number of Visits 12   Date for PT Re-Evaluation 02/19/17   PT Start Time 1608   PT Stop Time 1710   PT Time Calculation (min) 62 min   Activity Tolerance Patient tolerated treatment well   Behavior During Therapy [facility] for tasks assessed/performed      Past Medical History:  Diagnosis Date  . Anxiety   . Arthritis   . Depression   . Heartburn   . Hyperlipidemia   . Hypertension   . Kidney stone   . Migraines    without aura  . Obesity   . Thyroid disease     Past Surgical History:  Procedure Laterality Date  . Removal of extra tooth      There were no vitals filed for this visit.      Subjective Assessment - 01/23/17 1740    Subjective Pt reports her R medial foot hurts. Her L foot has gotten better. Pt had a fall this morning after twisting her R ankle. Pt was wearing a 1 inch sole orthotic sandal. Pt's leakage has remained the same and is better than what it just to be   Pertinent History Pt reported L ankle swelling 5 years ago, She began PT in August but she broke her L ankle which has healed. Her baseline swelling has returned and the pattern of swelling increases in spring/summer/fall and was reduced in th winter.  Dr. Vickki Muff ordered MRI showing bruising and he suggested resuming PT.               [facility] PT Assessment - 01/23/17 1724      Figure 8 Edema   Figure 8 - Right  54 cm   Figure 8 - Left  54 cm      Palpation   Palpation comment minor swelling under L lateral malleoli. decreased mm tensions compared to last session abductor digiti minimi    limited mobility R  talus/navicular / medial cuneiform                     [facility] Adult PT Treatment/Exercise - 01/23/17 1727      Therapeutic Activites    Therapeutic Activities --  see pt instructions      Neuro Re-ed    Neuro Re-ed Details  propioception, hind/mid/forefoot in walking/ backward walking, COM over fore/midfoot with resistance band at waist 10 ft x 5 reps PT holding band,  5 steps with band at waist on door knob      Manual Therapy   Manual therapy comments AP/PA mobs along talus/navicular/ medial cunieform on RLE, STM/fascial releases along L lateral retinaculum    Kinesiotex --  R medial arch, cranial direction over dorsum of R foot                     PT Long Term Goals - 01/23/17 1738      PT LONG TERM GOAL #1   Title Pt will decrease her urinary frequency from 1x/ hr to 1x/ 2hr in order to improve QOL.   Time 12  Period Weeks   Status Achieved     PT LONG TERM GOAL #2   Title Pt will decrease her pad wear from 3 / day to < 2 day in order to demo improved pelvic floor function   Time 12   Period Weeks   Status Achieved     PT LONG TERM GOAL #3   Title Pt will decrease her score on PSQI from 71% to < 65% in order to improve quality of sleep    Time 12   Period Weeks   Status On-going     PT LONG TERM GOAL #4   Title Pt will decreased her score on PFDI from 61% to < 55% in order to demo improved pelvic floor mm strength   Time 12   Period Weeks   Status On-going     PT LONG TERM GOAL #5   Title Pt will demo no pelvic floor / scar tensions/ tenderness across two visits in order to progress to pelvic floor coordinaiton and strengthening.    Time 12   Period Weeks   Status On-going     Additional Long Term Goals   Additional Long Term Goals Yes     PT LONG TERM GOAL #6   Title Pt will demo decreased swelling in her L ankle from 56 cm (figure 8 ) to 54 cm (Rankle) in order to demo improved management    Time 12   Period Weeks   Status  Achieved     PT LONG TERM GOAL #7   Title Pt will report B feet pain at 25% of the time across 2 weeks in order to demo IND with self-management of feet pain   Time 12   Period Weeks   Status New               Plan - 01/23/17 1742    Clinical Impression Statement Pt demo'd decreased L foot edema compared to last session. Pt demo'd increased arthrokinematic mobility of her feet bones bilaterally post Tx. Pt reported no pain in R foot in weight bearing and non weight bearing positions which is an improvement compared to what she felt upon arrival to session.  Progressed to weight bearing training to faciliate increased flexibilty of fore/mid/hind feet.  Pt has one more session scheduled and pt will look into whether she can afford more sessions. Although pt has made great progress with urinary Sx and foot pain, pt will continued to benefit from skilled PT in order to keep progressing towards her goals.     Rehab Potential Good   Clinical Impairments Affecting Rehab Potential pt has not f/u with her sleep study ordered by her MD, pt also has financial restraints, stress   PT Frequency 1x / week   PT Duration 12 weeks   PT Treatment/Interventions ADLs/Self Care Home Management;Aquatic Therapy;Moist Heat;Functional mobility training;Stair training;Gait training;Neuromuscular re-education;Therapeutic activities;Patient/family education;Therapeutic exercise;Balance training;Scar mobilization;Manual lymph drainage;Manual techniques;Taping;Energy conservation   Consulted and Agree with Plan of Care Patient      Patient will benefit from skilled therapeutic intervention in order to improve the following deficits and impairments:  Abnormal gait, Pain, Improper body mechanics, Decreased coordination, Decreased mobility, Increased muscle spasms, Postural dysfunction, Decreased strength, Decreased range of motion, Decreased activity tolerance, Decreased endurance, Decreased balance, Obesity, Increased  fascial restricitons, Difficulty walking, Decreased safety awareness  Visit Diagnosis: Other abnormalities of gait and mobility  Other lack of coordination  Stiffness of left foot, not elsewhere classified  Chronic low  back pain without sciatica, unspecified back pain laterality  Weakness generalized     Problem List Patient Active Problem List   Diagnosis Date Noted  . Hyperlipidemia 12/23/2016  . Anxiety 12/23/2016  . Lung nodule 12/11/2016  . Pharyngitis 10/12/2016  . Increased frequency of headaches 04/28/2016  . Urinary frequency 04/23/2016  . Swelling of left ankle joint 04/23/2016  . Hypothyroidism 04/23/2016  . Obesity 04/23/2016  . Rash 04/23/2016  . Missed period 04/23/2016    [name] ,PT, DPT, E-RYT  01/23/2017, 5:45 PM  [place] MAIN [facility] SERVICES [address] [place], [place], [address] Phone: [phone]   Fax:  [phone]  Name: Nicole Osborn MRN: [id number] Date of Birth: [birth year]

## 2017-01-23 NOTE — Patient Instructions (Signed)
Gain flexibilty at the fore.mid/hind foot  To bear weight more on the fore/midfoot   Less time int he high heeled viontics  Try propioceptive insoles   [email or web address] _______________________________________________  During the day at work:  (video)  Semi lunge position  Heel raise and the knee bend   5 reps  ______  resisted walking forward No resistance backward  Focus on place ment of feet behind  And activation of the navel of fore/midfoot and to push the floor away   5 reps      __________  Feet care :  Self -feet massage   Handshake : fingers between toes, moving ballmounds/toes back and forth several times while other hand anchors at arch. Do the same at the hind/mid foot.  Heel to toes upward to a letter Big Letter T strokes to spread ballmounds and toes, several times, pinch between webs of toes  Run finger tips along top of foot between long bones "comb between the bones"    Wiggle toes and spread them out when relaxing

## 2017-02-15 ENCOUNTER — Encounter: Payer: BC Managed Care – PPO | Admitting: Physical Therapy

## 2017-03-11 ENCOUNTER — Other Ambulatory Visit: Payer: Self-pay | Admitting: Family Medicine

## 2017-03-11 DIAGNOSIS — E034 Atrophy of thyroid (acquired): Secondary | ICD-10-CM

## 2017-03-11 DIAGNOSIS — E782 Mixed hyperlipidemia: Secondary | ICD-10-CM

## 2017-03-11 NOTE — Telephone Encounter (Signed)
Last labs reviewed Rx approved -------------------------- Please let patient know that I would like to check her thyroid lab and her fasting cholesterol in July; last TSH was one year ago July and we like to keep an eye on that every year, to adjust dose if needed Thank you

## 2017-03-11 NOTE — Telephone Encounter (Signed)
Left a detail voicemail.

## 2017-03-20 ENCOUNTER — Ambulatory Visit: Payer: BC Managed Care – PPO | Attending: Urology | Admitting: Physical Therapy

## 2017-03-20 DIAGNOSIS — G8929 Other chronic pain: Secondary | ICD-10-CM

## 2017-03-20 DIAGNOSIS — R531 Weakness: Secondary | ICD-10-CM | POA: Diagnosis present

## 2017-03-20 DIAGNOSIS — R278 Other lack of coordination: Secondary | ICD-10-CM | POA: Diagnosis present

## 2017-03-20 DIAGNOSIS — M25675 Stiffness of left foot, not elsewhere classified: Secondary | ICD-10-CM | POA: Diagnosis present

## 2017-03-20 DIAGNOSIS — R2689 Other abnormalities of gait and mobility: Secondary | ICD-10-CM | POA: Insufficient documentation

## 2017-03-20 DIAGNOSIS — M545 Low back pain: Secondary | ICD-10-CM | POA: Insufficient documentation

## 2017-03-20 NOTE — Patient Instructions (Addendum)
Standing:  10 reps on both sides x 3 x day     3 point tap        Figure-4 and then toe touch    _____  YOGA:  eagle pose to release back of hips  Sit in a chair position, feet together  Cross R thigh over L, press them strongly against each other  Cross R arm over L arm, palms clasp  Stay and breath 3-5 breaths  Sitting a little deeper    __________  Standing with knees unlocked

## 2017-03-20 NOTE — Therapy (Addendum)
[place] MAIN [facility] SERVICES [address] [place], [place], [address] Phone: [phone]   Fax:  [phone]  Physical Therapy Treatment / Progress note  Patient Details  Name: Nicole Osborn MRN: [id number] Date of Birth: 1971-06-18 Referring Provider: Ernestine Conrad   Encounter Date: 03/20/2017      PT End of Session - 03/20/17 1409    Visit Number 6   Number of Visits 12   Date for PT Re-Evaluation 06/12/17   PT Start Time 1310   PT Stop Time 1405   PT Time Calculation (min) 55 min   Activity Tolerance Patient tolerated treatment well   Behavior During Therapy [facility] for tasks assessed/performed      Past Medical History:  Diagnosis Date  . Anxiety   . Arthritis   . Depression   . Heartburn   . Hyperlipidemia   . Hypertension   . Kidney stone   . Migraines    without aura  . Obesity   . Thyroid disease     Past Surgical History:  Procedure Laterality Date  . Removal of extra tooth      There were no vitals filed for this visit.      Subjective Assessment - 03/20/17 1317    Subjective Pt's ankle pain has decreased by 50%. There is still some stiffness. Pt 's daytime urgency has improved by 60%.  Pt has some leakage but does not fill up the pads.     Pertinent History Pt reported L ankle swelling 5 years ago, She began PT in August but she broke her L ankle which has healed. Her baseline swelling has returned and the pattern of swelling increases in spring/summer/fall and was reduced in th winter.  Dr. Vickki Muff ordered MRI showing bruising and he suggested resuming PT.                         Pelvic Floor Special Questions - 03/20/17 1422    Pelvic Floor Internal Exam pt consented verbally without contraindications   Exam Type Vaginal   Palpation increased tensions and tenderness along R obt int > L (decreased post Tx)      Noted R tightness over ilia crest R PSIS, limited hypomobility   Tx: thiele massage  over problem areas indicated above        RLE long axis distraction, inferior/superior mob of sacrum              PT Education - 03/20/17 1409    Education provided Yes   Education Details HEP   Person(s) Educated Patient   Methods Explanation;Demonstration;Tactile cues;Verbal cues;Handout   Comprehension Returned demonstration;Verbalized understanding             PT Long Term Goals - 03/20/17 1540      PT LONG TERM GOAL #1   Title Pt will decrease her urinary frequency from 1x/ hr to 1x/ 2hr in order to improve QOL.   Time 12   Period Weeks   Status Achieved     PT LONG TERM GOAL #2   Title Pt will decrease her pad wear from 3 / day to < 2 day in order to demo improved pelvic floor function   Time 12   Period Weeks   Status Achieved     PT LONG TERM GOAL #3   Title Pt will decrease her score on PSQI from 71% to < 65% in order to improve quality of sleep  Time 12   Period Weeks   Status On-going     PT LONG TERM GOAL #4   Title Pt will decreased her score on PFDI from 61% to < 55% in order to demo improved pelvic floor mm strength   Time 12   Period Weeks   Status On-going     PT LONG TERM GOAL #5   Title Pt will demo no pelvic floor / scar tensions/ tenderness across two visits in order to progress to pelvic floor coordinaiton and strengthening.    Time 12   Period Weeks   Status On-going     PT LONG TERM GOAL #6   Title Pt will demo decreased swelling in her L ankle from 56 cm (figure 8 ) to 54 cm (Rankle) in order to demo improved management    Time 12   Period Weeks   Status Achieved     PT LONG TERM GOAL #7   Title Pt will report B feet pain at 25% of the time across 2 weeks in order to demo IND with self-management of feet pain   Time 12   Period Weeks   Status Achieved               Plan - 03/20/17 1410    Clinical Impression Statement Pt has achieved 4/7 goals across the past 6 visits.  Pt reports less dampness on the urinary  pads during the day. Pt 's feet pain has improved with less swelling. Pt showed pelvic floor tensions on R today which is the opposite side to the side with pelvic floor overactivity at previous visits. Tensions decreased post Tx.  These R sided tensions are associated with R sided pelvic obliquties which improved with manual Tx today.  Anticipate pt will not benefit from kegel exercises due to pelvic floor overactivity. Suspect her remaining pelvic obliquities are associated with her previous feet issues. Plan to address incontinence issues with integrated approach of deep core mm.  Plan to incorporate regional interdependence approach to yield optimal and longer-lasting outcomes.  Pt continues to benefit from skilled PT.    Rehab Potential Good   Clinical Impairments Affecting Rehab Potential pt has not f/u with her sleep study ordered by her MD, pt also has financial restraints, stress   PT Frequency 1x / week   PT Duration 12 weeks   PT Treatment/Interventions ADLs/Self Care Home Management;Aquatic Therapy;Moist Heat;Functional mobility training;Stair training;Gait training;Neuromuscular re-education;Therapeutic activities;Patient/family education;Therapeutic exercise;Balance training;Scar mobilization;Manual lymph drainage;Manual techniques;Taping;Energy conservation   Consulted and Agree with Plan of Care Patient      Patient will benefit from skilled therapeutic intervention in order to improve the following deficits and impairments:  Abnormal gait, Pain, Improper body mechanics, Decreased coordination, Decreased mobility, Increased muscle spasms, Postural dysfunction, Decreased strength, Decreased range of motion, Decreased activity tolerance, Decreased endurance, Decreased balance, Obesity, Increased fascial restricitons, Difficulty walking, Decreased safety awareness  Visit Diagnosis: Other abnormalities of gait and mobility  Other lack of coordination  Stiffness of left foot, not  elsewhere classified  Chronic low back pain without sciatica, unspecified back pain laterality  Weakness generalized     Problem List Patient Active Problem List   Diagnosis Date Noted  . Hyperlipidemia 12/23/2016  . Anxiety 12/23/2016  . Lung nodule 12/11/2016  . Pharyngitis 10/12/2016  . Increased frequency of headaches 04/28/2016  . Urinary frequency 04/23/2016  . Swelling of left ankle joint 04/23/2016  . Hypothyroidism 04/23/2016  . Obesity 04/23/2016  . Rash 04/23/2016  .  Missed period 04/23/2016    [name] ,PT, DPT, E-RYT  03/20/2017, 3:48 PM  [place] MAIN [facility] SERVICES [address] [place], [place], [address] Phone: [phone]   Fax:  [phone]  Name: GAVIN FAIVRE MRN: [id number] Date of Birth: 04/13/1971

## 2017-04-08 ENCOUNTER — Ambulatory Visit: Payer: BC Managed Care – PPO | Attending: Urology | Admitting: Physical Therapy

## 2017-04-08 DIAGNOSIS — M25571 Pain in right ankle and joints of right foot: Secondary | ICD-10-CM | POA: Insufficient documentation

## 2017-04-08 DIAGNOSIS — R278 Other lack of coordination: Secondary | ICD-10-CM | POA: Diagnosis present

## 2017-04-08 DIAGNOSIS — R531 Weakness: Secondary | ICD-10-CM | POA: Diagnosis present

## 2017-04-08 DIAGNOSIS — R2689 Other abnormalities of gait and mobility: Secondary | ICD-10-CM

## 2017-04-08 DIAGNOSIS — M545 Low back pain, unspecified: Secondary | ICD-10-CM

## 2017-04-08 DIAGNOSIS — M25675 Stiffness of left foot, not elsewhere classified: Secondary | ICD-10-CM

## 2017-04-08 DIAGNOSIS — G8929 Other chronic pain: Secondary | ICD-10-CM | POA: Diagnosis present

## 2017-04-08 NOTE — Therapy (Signed)
[place] MAIN [facility] SERVICES [address] [place], [place], [address] Phone: [phone]   Fax:  [phone]  Physical Therapy Treatment  Patient Details  Name: Nicole Osborn MRN: [id number] Date of Birth: 12-03-1970 Referring Provider: Ernestine Conrad   Encounter Date: 04/08/2017      PT End of Session - 04/08/17 1415    Visit Number 7   Number of Visits 12   Date for PT Re-Evaluation 06/12/17   PT Start Time 1310   PT Stop Time 1410   PT Time Calculation (min) 60 min   Activity Tolerance Patient tolerated treatment well   Behavior During Therapy [facility] for tasks assessed/performed      Past Medical History:  Diagnosis Date  . Anxiety   . Arthritis   . Depression   . Heartburn   . Hyperlipidemia   . Hypertension   . Kidney stone   . Migraines    without aura  . Obesity   . Thyroid disease     Past Surgical History:  Procedure Laterality Date  . Removal of extra tooth      There were no vitals filed for this visit.      Subjective Assessment - 04/08/17 1311    Subjective Pt's urinary leakage has improved and remained at 60% sincel ast visit. The L hip and L knee have been hurting her at the same time and she is not able to recall if the pain is related to weather or with increased walking .  Pt 's ankle has some swelling but not as bad as last year.    Pertinent History Pt reported L ankle swelling 5 years ago, She began PT in August but she broke her L ankle which has healed. Her baseline swelling has returned and the pattern of swelling increases in spring/summer/fall and was reduced in th winter.  Dr. Vickki Muff ordered MRI showing bruising and he suggested resuming PT.               [facility] PT Assessment - 04/08/17 1332      Squat   Comments toe gripping on the rise      Strength   Overall Strength Comments R hip abd 3+/5, L 4/5, hip ext 3+/5, L hip 4/5       Palpation   SI assessment  L PSIS tender, hypomobility along  L lateral edge of sacrum,                   Pelvic Floor Special Questions - 04/08/17 1354    Pelvic Floor Internal Exam pt consented verbally without contraindications   Exam Type Vaginal   Palpation No pelvic floor tensions    Strength fair squeeze, definite lift           [facility] Adult PT Treatment/Exercise - 04/08/17 1414      Neuro Re-ed    Neuro Re-ed Details  see pt instructions     Exercises   Exercises --  see pt instructions                PT Education - 04/08/17 1410    Education provided Yes   Education Details HEP   Person(s) Educated Patient   Methods Explanation;Demonstration;Tactile cues;Handout;Verbal cues   Comprehension Returned demonstration;Verbalized understanding             PT Long Term Goals - 03/20/17 1540      PT LONG TERM GOAL #1   Title Pt will  decrease her urinary frequency from 1x/ hr to 1x/ 2hr in order to improve QOL.   Time 12   Period Weeks   Status Achieved     PT LONG TERM GOAL #2   Title Pt will decrease her pad wear from 3 / day to < 2 day in order to demo improved pelvic floor function   Time 12   Period Weeks   Status Achieved     PT LONG TERM GOAL #3   Title Pt will decrease her score on PSQI from 71% to < 65% in order to improve quality of sleep    Time 12   Period Weeks   Status On-going     PT LONG TERM GOAL #4   Title Pt will decreased her score on PFDI from 61% to < 55% in order to demo improved pelvic floor mm strength   Time 12   Period Weeks   Status On-going     PT LONG TERM GOAL #5   Title Pt will demo no pelvic floor / scar tensions/ tenderness across two visits in order to progress to pelvic floor coordinaiton and strengthening.    Time 12   Period Weeks   Status On-going     PT LONG TERM GOAL #6   Title Pt will demo decreased swelling in her L ankle from 56 cm (figure 8 ) to 54 cm (Rankle) in order to demo improved management    Time 12   Period Weeks   Status Achieved      PT LONG TERM GOAL #7   Title Pt will report B feet pain at 25% of the time across 2 weeks in order to demo IND with self-management of feet pain   Time 12   Period Weeks   Status Achieved               Plan - 04/08/17 1415    Clinical Impression Statement Pt showed improved L SIJ mobility and less tenderness at L PSIS following Tx.  Pt showed no pelvic floor mm tightness which is an impovement. Pt was progressed to resistance band exericses for glut strengthening.  Pt is progressing welll towards her goals. Plan to work with pt in the gym as pt is interested in joining a gym.     Rehab Potential Good   Clinical Impairments Affecting Rehab Potential pt has not f/u with her sleep study ordered by her MD, pt also has financial restraints, stress   PT Frequency 1x / week   PT Duration 12 weeks   PT Treatment/Interventions ADLs/Self Care Home Management;Aquatic Therapy;Moist Heat;Functional mobility training;Stair training;Gait training;Neuromuscular re-education;Therapeutic activities;Patient/family education;Therapeutic exercise;Balance training;Scar mobilization;Manual lymph drainage;Manual techniques;Taping;Energy conservation   Consulted and Agree with Plan of Care Patient      Patient will benefit from skilled therapeutic intervention in order to improve the following deficits and impairments:  Abnormal gait, Pain, Improper body mechanics, Decreased coordination, Decreased mobility, Increased muscle spasms, Postural dysfunction, Decreased strength, Decreased range of motion, Decreased activity tolerance, Decreased endurance, Decreased balance, Obesity, Increased fascial restricitons, Difficulty walking, Decreased safety awareness  Visit Diagnosis: Other abnormalities of gait and mobility  Other lack of coordination  Stiffness of left foot, not elsewhere classified  Chronic low back pain without sciatica, unspecified back pain laterality  Weakness generalized     Problem  List Patient Active Problem List   Diagnosis Date Noted  . Hyperlipidemia 12/23/2016  . Anxiety 12/23/2016  . Lung nodule 12/11/2016  . Pharyngitis 10/12/2016  .  Increased frequency of headaches 04/28/2016  . Urinary frequency 04/23/2016  . Swelling of left ankle joint 04/23/2016  . Hypothyroidism 04/23/2016  . Obesity 04/23/2016  . Rash 04/23/2016  . Missed period 04/23/2016    [name] ,PT, DPT, E-RYT  04/08/2017, 2:33 PM  [place] MAIN [facility] SERVICES [address] [place], [place], [address] Phone: [phone]   Fax:  [phone]  Name: ANAISE STERBENZ MRN: [id number] Date of Birth: Feb 07, 1971

## 2017-04-08 NOTE — Patient Instructions (Signed)
For sacroiliac joint flexibility  Prone Heel Press for strengthening sacro-iliac joints  1. Lie on your belly. If you have an arch in your low back or it feels umcomfortable, place a pillow under your low belly/hips to make sure your low back feel comfortable.   2. Place our forehead on top of your palms.      Widen your knees apart for starting position.   3. Inhale, feel belly and low back expand  4. Exhale, feel belly hug in, press heel together and count aloud for 5 sec. Then relax the heel squeezing.  Perform 10 reps of 5 sec holds. 2 sets/ day.    If you feel entire buttock tighten too much or feel low back pain, apply 50% less effort. As you press your heel together, you will feel as if your pubic bone (front of your pelvis) and sacrum (back of your pelvis) gentle move towards each other or your low abdominal muscles hug in more.         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 This will help enforce a dedication to your self care time.   Next week: 15 min

## 2017-04-22 ENCOUNTER — Ambulatory Visit: Payer: BC Managed Care – PPO | Admitting: Physical Therapy

## 2017-04-22 DIAGNOSIS — M25675 Stiffness of left foot, not elsewhere classified: Secondary | ICD-10-CM

## 2017-04-22 DIAGNOSIS — R2689 Other abnormalities of gait and mobility: Secondary | ICD-10-CM

## 2017-04-22 DIAGNOSIS — R278 Other lack of coordination: Secondary | ICD-10-CM

## 2017-04-22 DIAGNOSIS — R531 Weakness: Secondary | ICD-10-CM

## 2017-04-22 DIAGNOSIS — M545 Low back pain: Secondary | ICD-10-CM

## 2017-04-22 DIAGNOSIS — G8929 Other chronic pain: Secondary | ICD-10-CM

## 2017-04-22 NOTE — Therapy (Signed)
[place] MAIN [facility] SERVICES [address] [place], [place], [address] Phone: [phone]   Fax:  [phone]  Physical Therapy Treatment / Discharge Summary   Patient Details  Name: Nicole Osborn MRN: [id number] Date of Birth: 05-03-1971 Referring Provider: Ernestine Conrad   Encounter Date: 04/22/2017      PT End of Session - 04/22/17 1315    Visit Number 8   Number of Visits 12   Date for PT Re-Evaluation 06/12/17   PT Start Time 1310   Activity Tolerance Patient tolerated treatment well   Behavior During Therapy [facility] for tasks assessed/performed      Past Medical History:  Diagnosis Date  . Anxiety   . Arthritis   . Depression   . Heartburn   . Hyperlipidemia   . Hypertension   . Kidney stone   . Migraines    without aura  . Obesity   . Thyroid disease     Past Surgical History:  Procedure Laterality Date  . Removal of extra tooth      There were no vitals filed for this visit.      Subjective Assessment - 04/22/17 1312    Subjective Pt rpeorts her urinary leakage has remained at 60% improvement. Pt has minmal ankle swelling across the past  month.    Pertinent History Pt reported L ankle swelling 5 years ago, She began PT in August but she broke her L ankle which has healed. Her baseline swelling has returned and the pattern of swelling increases in spring/summer/fall and was reduced in th winter.  Dr. Vickki Muff ordered MRI showing bruising and he suggested resuming PT.             AssessmentL  Increased lumbar lordosis with sitting and standing. Cued for pelvic tilt and stretches and pt reported less low back pain after ward               [facility] Adult PT Treatment/Exercise - 04/22/17 1428      Therapeutic Activites    Therapeutic Activities --  6 min on elliptical, 10 min on Nut step , 3 reps lat pull 20 lbs in mini squat, 10 reps 73 lb knee press,   Cued for proper co-activation of deep core mm . Pt  demo'd correctly      Neuro Re-ed    Neuro Re-ed Details  alignment cues for flexibility stretches pre and post elliptical/ nu step. Filmed hip flexor stretch, back stretch, hip flexor twist, figure 4 stretch, quad stretch. before and after elliptical/ NU step.                      PT Long Term Goals - 04/22/17 1316      PT LONG TERM GOAL #1   Title Pt will decrease her urinary frequency from 1x/ hr to 1x/ 2hr in order to improve QOL.   Time 12   Period Weeks   Status Achieved     PT LONG TERM GOAL #2   Title Pt will decrease her pad wear from 3 / day to < 2 day in order to demo improved pelvic floor function   Time 12   Period Weeks   Status Achieved     PT LONG TERM GOAL #3   Title Pt will decrease her score on PSQI from 71% to < 65% in order to improve quality of sleep  (7/30: 19%)     Time 12   Period  Weeks   Status Achieved     PT LONG TERM GOAL #4   Title Pt will decreased her score on PFDI from 61% to < 55% in order to demo improved pelvic floor mm strength  ( 7/30:44%)    Time 12   Period Weeks   Status Achieved     PT LONG TERM GOAL #5   Title Pt will demo no pelvic floor / scar tensions/ tenderness across two visits in order to progress to pelvic floor coordinaiton and strengthening.    Time 12   Period Weeks   Status Achieved     PT LONG TERM GOAL #6   Title Pt will demo decreased swelling in her L ankle from 56 cm (figure 8 ) to 54 cm (Rankle) in order to demo improved management    Time 12   Period Weeks   Status Achieved     PT LONG TERM GOAL #7   Title Pt will report B feet pain at 25% of the time across 2 weeks in order to demo IND with self-management of feet pain   Time 12   Period Weeks   Status Achieved               Plan - 04/22/17 1422    Clinical Impression Statement Pt has acheived 100% of her goals with improved urinary Sx and significantly less ankle swelling.   Pt's PFDI score 61% to 44%, indicating improved urinary  Sx. Pt has decreased her pad wear and urinary frequency. Her decreased from Her PSQI score decreased from 71% to 19% indicated significantly improved sleep quality.  Pt has demo'd improved sacroiliac joint and ankle/foot joint mobility, decreased pelvic floor mm/ hip mm tightness, and less ankle swelling. Pt demo'd proper co-activation of deep core muscle w/ gym routine ( elliptical, Nu-Step, lat -pull down, and knee press) today after cuing. Pt also demo'd IND with flexibility routine to compliment gym routine and was advised to maintain deep core strengthening exercises to continue making further progress with her urinary Sx which she states has improved by 60%.  Pt is ready for d/c today.      Rehab Potential Good   Clinical Impairments Affecting Rehab Potential pt has not f/u with her sleep study ordered by her MD, pt also has financial restraints, stress   PT Frequency 1x / week   PT Duration 12 weeks   PT Treatment/Interventions ADLs/Self Care Home Management;Aquatic Therapy;Moist Heat;Functional mobility training;Stair training;Gait training;Neuromuscular re-education;Therapeutic activities;Patient/family education;Therapeutic exercise;Balance training;Scar mobilization;Manual lymph drainage;Manual techniques;Taping;Energy conservation   Consulted and Agree with Plan of Care Patient      Patient will benefit from skilled therapeutic intervention in order to improve the following deficits and impairments:  Abnormal gait, Pain, Improper body mechanics, Decreased coordination, Decreased mobility, Increased muscle spasms, Postural dysfunction, Decreased strength, Decreased range of motion, Decreased activity tolerance, Decreased endurance, Decreased balance, Obesity, Increased fascial restricitons, Difficulty walking, Decreased safety awareness  Visit Diagnosis: Other abnormalities of gait and mobility  Other lack of coordination  Stiffness of left foot, not elsewhere classified  Chronic low  back pain without sciatica, unspecified back pain laterality  Weakness generalized     Problem List Patient Active Problem List   Diagnosis Date Noted  . Hyperlipidemia 12/23/2016  . Anxiety 12/23/2016  . Lung nodule 12/11/2016  . Pharyngitis 10/12/2016  . Increased frequency of headaches 04/28/2016  . Urinary frequency 04/23/2016  . Swelling of left ankle joint 04/23/2016  . Hypothyroidism 04/23/2016  .  Obesity 04/23/2016  . Rash 04/23/2016  . Missed period 04/23/2016    [name] ,PT, DPT, E-RYT  04/22/2017, 2:31 PM  [place] MAIN [facility] SERVICES [address] [place], [place], [address] Phone: [phone]   Fax:  [phone]  Name: BERDENA CISEK MRN: [id number] Date of Birth: 08/01/1971

## 2017-04-24 ENCOUNTER — Other Ambulatory Visit: Payer: Self-pay | Admitting: Family Medicine

## 2017-04-24 DIAGNOSIS — E034 Atrophy of thyroid (acquired): Secondary | ICD-10-CM

## 2017-04-24 DIAGNOSIS — E782 Mixed hyperlipidemia: Secondary | ICD-10-CM

## 2017-04-24 LAB — LIPID PANEL
CHOL/HDL RATIO: 5.3 ratio — AB (ref <5.0)
CHOLESTEROL: 205 mg/dL — AB (ref <200)
HDL: 39 mg/dL — ABNORMAL LOW (ref >50)
LDL Cholesterol: 127 mg/dL — ABNORMAL HIGH (ref <100)
TRIGLYCERIDES: 196 mg/dL — AB (ref <150)
VLDL: 39 mg/dL — AB (ref <30)

## 2017-04-24 LAB — TSH: TSH: 0.12 mIU/L — ABNORMAL LOW

## 2017-04-25 ENCOUNTER — Other Ambulatory Visit: Payer: Self-pay | Admitting: Family Medicine

## 2017-04-25 DIAGNOSIS — E034 Atrophy of thyroid (acquired): Secondary | ICD-10-CM

## 2017-04-25 MED ORDER — LEVOTHYROXINE SODIUM 112 MCG PO TABS: 112.0000 ug | ORAL_TABLET | Freq: Every day | ORAL | 1 refills | Status: DC

## 2017-04-25 MED ORDER — LEVOTHYROXINE SODIUM 125 MCG PO TABS
125.0000 ug | ORAL_TABLET | Freq: Every day | ORAL | 1 refills | Status: DC
Start: 2017-04-25 — End: 2017-07-09

## 2017-04-25 NOTE — Progress Notes (Signed)
Alternating 112 mcg 4 days a week and 125 mcg 3 days a week Recheck TSH in 8 weeks Mychart note to patient

## 2017-04-25 NOTE — Assessment & Plan Note (Signed)
Recheck TSH around Sept 28th

## 2017-04-30 ENCOUNTER — Telehealth: Payer: Self-pay | Admitting: Family Medicine

## 2017-04-30 NOTE — Telephone Encounter (Signed)
Requesting return call checking status on test results. Also have a question pertaining to levothyroxine dosage change.

## 2017-05-01 NOTE — Telephone Encounter (Signed)
I returned her call; asked her to check MyChart for instructions; explained new doses and schedule, recheck labs Sept 28th; chol needs work, call if any issues

## 2017-05-06 ENCOUNTER — Ambulatory Visit: Payer: BC Managed Care – PPO | Admitting: Physical Therapy

## 2017-05-06 ENCOUNTER — Other Ambulatory Visit: Payer: Self-pay | Admitting: Family Medicine

## 2017-05-06 DIAGNOSIS — E034 Atrophy of thyroid (acquired): Secondary | ICD-10-CM

## 2017-05-06 NOTE — Telephone Encounter (Signed)
Request came through for 125 mcg thyroid med daily; DENIED; I sent Rx for 125 mcg with different instructions on 04/25/17; pharmacy to use that one

## 2017-05-29 ENCOUNTER — Other Ambulatory Visit: Payer: Self-pay | Admitting: Family Medicine

## 2017-05-29 DIAGNOSIS — Z1231 Encounter for screening mammogram for malignant neoplasm of breast: Secondary | ICD-10-CM

## 2017-06-23 ENCOUNTER — Encounter: Payer: Self-pay | Admitting: Emergency Medicine

## 2017-06-23 ENCOUNTER — Emergency Department
Admission: EM | Admit: 2017-06-23 | Discharge: 2017-06-23 | Disposition: A | Discharge status: Home / Self Care | Payer: BC Managed Care – PPO | Attending: Emergency Medicine | Admitting: Emergency Medicine

## 2017-06-23 DIAGNOSIS — Z79899 Other long term (current) drug therapy: Secondary | ICD-10-CM | POA: Diagnosis not present

## 2017-06-23 DIAGNOSIS — I10 Essential (primary) hypertension: Secondary | ICD-10-CM | POA: Diagnosis not present

## 2017-06-23 DIAGNOSIS — E039 Hypothyroidism, unspecified: Secondary | ICD-10-CM | POA: Insufficient documentation

## 2017-06-23 DIAGNOSIS — R519 Headache, unspecified: Secondary | ICD-10-CM

## 2017-06-23 DIAGNOSIS — R51 Headache: Secondary | ICD-10-CM | POA: Diagnosis not present

## 2017-06-23 MED ORDER — ONDANSETRON 4 MG PO TBDP
4.0000 mg | ORAL_TABLET | Freq: Once | ORAL | Status: AC
  Administered 2017-06-23: 4 mg via ORAL
  Filled 2017-06-23: qty 1

## 2017-06-23 MED ORDER — KETOROLAC TROMETHAMINE 30 MG/ML IJ SOLN
30.0000 mg | Freq: Once | INTRAMUSCULAR | Status: AC
  Administered 2017-06-23: 30 mg via INTRAMUSCULAR
  Filled 2017-06-23: qty 1

## 2017-06-23 NOTE — ED Triage Notes (Signed)
Patient arrives to [facility] ED from [place]. C/o migraine with N/V. Was seen at [facility] for same, however they felt the patient "needed fluids"

## 2017-06-23 NOTE — ED Notes (Signed)
Pt c/o having a migraine since 430am with N/v x7 today, states she has a hx of migraines and took meds today without any relief.Marland Kitchen

## 2017-06-23 NOTE — ED Provider Notes (Signed)
[facility] Emergency Department Provider Note  ____________________________________________   First MD Initiated Contact with Patient 06/23/17 1427     (approximate)  I have reviewed the triage vital signs and the nursing notes.   HISTORY  Chief Complaint Migraine    HPI Nicole Osborn is a 46 y.o. female with a history of migraine headache was presenting with a migraine since 4:30 this morning. Says that her headache is a 6 out of 10 at this time and has been waxing and waning and improves after she vomits. She says that she has had about 7 episodes of vomiting. Says the pain is frontal and radiating towards the back of her head. She says the pain is similar to previous migraines. Patient also expressing photophobia. Had tingling in her bilateral lower extremities this morning and says that she has had numbness previously with her migraine. [facility] "[facility]." Has been to the neurologist before and has been given a shot of Toradol with resolution of her symptoms. Sumatriptan has not resolved her symptoms at home. Does not report any thunderclap quality to the onset of the headache.   Past Medical History:  Diagnosis Date  . Anxiety   . Arthritis   . Depression   . Heartburn   . Hyperlipidemia   . Hypertension   . Kidney stone   . Migraines    without aura  . Obesity   . Thyroid disease     Patient Active Problem List   Diagnosis Date Noted  . Hyperlipidemia 12/23/2016  . Anxiety 12/23/2016  . Lung nodule 12/11/2016  . Pharyngitis 10/12/2016  . Increased frequency of headaches 04/28/2016  . Urinary frequency 04/23/2016  . Swelling of left ankle joint 04/23/2016  . Hypothyroidism 04/23/2016  . Obesity 04/23/2016  . Rash 04/23/2016  . Missed period 04/23/2016    Past Surgical History:  Procedure Laterality Date  . Removal of extra tooth      Prior to Admission medications   Medication Sig  Start Date End Date Taking? Authorizing Provider  Cholecalciferol (VITAMIN D3) 2000 units capsule Take by mouth.    [provider]  Ibuprofen-Diphenhydramine Cit (IBUPROFEN PM PO) Take by mouth.    [provider]  levothyroxine (SYNTHROID, LEVOTHROID) 112 MCG tablet Take 1 tablet (112 mcg total) by mouth daily. On Tuesdays, Thursdays, Saturdays, and Sunday 04/25/17   Arnetha Courser, MD  levothyroxine (SYNTHROID, LEVOTHROID) 125 MCG tablet Take 1 tablet (125 mcg total) by mouth daily before breakfast. Just on Mondays, Wednesdays, and Fridays 04/25/17   Arnetha Courser, MD  Magnesium 250 MG TABS Take by mouth.    [provider]  Multiple Vitamin (MULTI-VITAMINS) TABS Take by mouth.    [provider]  SUMAtriptan (IMITREX) 100 MG tablet One by mouth at onset of migraine; may repeat one pill 2 hours later if absolutely needed; max 2 pills in 24 hours 08/27/15   Lada, Satira Anis, MD    Allergies Cefaclor  Family History  Problem Relation Age of Onset  . Depression Mother   . Arthritis Mother   . Stroke Father   . Hypertension Father   . Breast cancer Paternal Aunt 68  . Emphysema Maternal Grandmother   . Emphysema Maternal Grandfather   . Dementia Paternal Grandmother   . Asthma Son   . Eczema Son   . Kidney disease Neg Hx   . Bladder Cancer Neg Hx  Social History Social History  Substance Use Topics  . Smoking status: Never Smoker  . Smokeless tobacco: Never Used  . Alcohol use No    Review of Systems  Constitutional: No fever/chills Eyes: No visual changes. ENT: No sore throat. Cardiovascular: Denies chest pain. Respiratory: Denies shortness of breath. Gastrointestinal: No abdominal pain.   No diarrhea.  No constipation. Genitourinary: Negative for dysuria. Musculoskeletal: Negative for back pain. Skin: Negative for rash. Neurological: Negative for focal weakness    ____________________________________________   PHYSICAL  EXAM:  VITAL SIGNS: ED Triage Vitals  Enc Vitals Group     BP 06/23/17 1427 132/82     Pulse Rate 06/23/17 1427 81     Resp 06/23/17 1427 16     Temp 06/23/17 1427 (!) 97.5 F (36.4 C)     Temp Source 06/23/17 1427 Oral     SpO2 06/23/17 1427 100 %     Weight 06/23/17 1427 210 lb (95.3 kg)     Height 06/23/17 1427 5\' 6"  (1.676 m)     Head Circumference --      Peak Flow --      Pain Score 06/23/17 1424 8     Pain Loc --      Pain Edu? --      Excl. in [place]? --     Constitutional: Alert and oriented. Well appearing and in no acute distress. Eyes: Conjunctivae are normal.  PERRLA.  Head: Atraumatic. Nose: No congestion/rhinnorhea. Mouth/Throat: Mucous membranes are moist.  Neck: No stridor.   Cardiovascular: Normal rate, regular rhythm. Grossly normal heart sounds.   Respiratory: Normal respiratory effort.  No retractions. Lungs CTAB. Gastrointestinal: Soft and nontender. No distention. No CVA tenderness. Musculoskeletal: No lower extremity tenderness nor edema.  No joint effusions. Neurologic:  Normal speech and language. No gross focal neurologic deficits are appreciated. Skin:  Skin is warm, dry and intact. No rash noted. Psychiatric: Mood and affect are normal. Speech and behavior are normal.  ____________________________________________   LABS (all labs ordered are listed, but only abnormal results are displayed)  Labs Reviewed - No data to display ____________________________________________  EKG   ____________________________________________  RADIOLOGY   ____________________________________________   PROCEDURES  Procedure(s) performed:   Procedures  Critical Care performed:   ____________________________________________   INITIAL IMPRESSION / ASSESSMENT AND PLAN / ED COURSE  Pertinent labs & imaging results that were available during my care of the patient were reviewed by me and considered in my medical decision making (see chart for  details).  DDX: Migraine headache, tension headache, general headache Less likely to be secondary cephalgia.Marland Kitchen His headaches with similar qualities. No sudden onset or worst headache of life reported. No focal neurologic deficits at this time.    ----------------------------------------- 3:44 PM on 06/23/2017 -----------------------------------------  Patient at this time says that her pain is improved and is now at a 3-4 out of 10. She says that she is no longer nauseous. Denies any other neurologic complaints. I offered her further medication including IV and she said that she would rather go home and sleep. She says that she would rather follow up with her neurologist in the office. Patient was able tolerate by mouth fluids. Patient will be discharged at this time.  ____________________________________________   FINAL CLINICAL IMPRESSION(S) / ED DIAGNOSES  headache    NEW MEDICATIONS STARTED DURING THIS VISIT:  New Prescriptions   No medications on file     Note:  This document was prepared using Dragon voice recognition software  and may include unintentional dictation errors.     Orbie Pyo, MD 06/23/17 [phone]

## 2017-06-23 NOTE — ED Notes (Signed)
Pt husband went to the cafeteria due to low blood sugar per wife, asked if she could stay in the room until he returned, to make sure he is safe to drive them home.

## 2017-07-01 ENCOUNTER — Other Ambulatory Visit: Payer: Self-pay | Admitting: Family Medicine

## 2017-07-01 ENCOUNTER — Other Ambulatory Visit: Payer: Self-pay

## 2017-07-01 DIAGNOSIS — E034 Atrophy of thyroid (acquired): Secondary | ICD-10-CM

## 2017-07-01 NOTE — Telephone Encounter (Signed)
Lab Results  Component Value Date   TSH 0.12 (L) 04/24/2017   T4TOTAL 7.5 02/02/2016   Please remind patient that she is due for labs I'll give a limited amount, but we need to verify the dose is correct Thank you

## 2017-07-01 NOTE — Addendum Note (Signed)
Addended by: Bud Face N on: 07/01/2017 10:57 AM   Modules accepted: Orders

## 2017-07-01 NOTE — Telephone Encounter (Signed)
Pt has been notified she states she will come in middle of this week. I put the labs up front.

## 2017-07-08 LAB — T4, FREE: Free T4: 1.3 ng/dL (ref 0.8–1.8)

## 2017-07-08 LAB — TSH: TSH: 0.17 m[IU]/L — AB

## 2017-07-09 ENCOUNTER — Telehealth: Payer: Self-pay

## 2017-07-09 ENCOUNTER — Other Ambulatory Visit: Payer: Self-pay | Admitting: Family Medicine

## 2017-07-09 DIAGNOSIS — E034 Atrophy of thyroid (acquired): Secondary | ICD-10-CM

## 2017-07-09 MED ORDER — LEVOTHYROXINE SODIUM 112 MCG PO TABS: 112.0000 ug | ORAL_TABLET | Freq: Every day | ORAL | 2 refills | Status: DC

## 2017-07-09 NOTE — Progress Notes (Signed)
Change dose; recheck tsh in 8 weeks

## 2017-07-09 NOTE — Telephone Encounter (Signed)
-----   Message from Arnetha Courser, MD sent at 07/09/2017  8:22 AM EDT ----- Please let pt know that I'm decreasing her thyroid medicine to 112 mcg daily; recheck TSH in 8 weeks (already ordered); thank you

## 2017-07-09 NOTE — Telephone Encounter (Signed)
Called pt no answer. LM for pt informing her of new medication dosage and the need to recheck in 8 weeks.

## 2017-07-11 ENCOUNTER — Other Ambulatory Visit: Payer: Self-pay | Admitting: Family Medicine

## 2017-07-11 DIAGNOSIS — E034 Atrophy of thyroid (acquired): Secondary | ICD-10-CM

## 2017-07-11 NOTE — Telephone Encounter (Signed)
Thyroid med 125 strength DENIED; now just on 112

## 2017-09-06 ENCOUNTER — Telehealth: Payer: Self-pay

## 2017-09-06 ENCOUNTER — Encounter: Payer: Self-pay | Admitting: Family Medicine

## 2017-09-06 ENCOUNTER — Ambulatory Visit: Payer: BC Managed Care – PPO | Admitting: Family Medicine

## 2017-09-06 VITALS — BP 124/86 | HR 100 | Temp 97.5°F | Resp 16 | Wt 220.7 lb

## 2017-09-06 DIAGNOSIS — Z6835 Body mass index (BMI) 35.0-35.9, adult: Secondary | ICD-10-CM

## 2017-09-06 DIAGNOSIS — E6609 Other obesity due to excess calories: Secondary | ICD-10-CM

## 2017-09-06 DIAGNOSIS — R350 Frequency of micturition: Secondary | ICD-10-CM

## 2017-09-06 DIAGNOSIS — M25472 Effusion, left ankle: Secondary | ICD-10-CM

## 2017-09-06 DIAGNOSIS — I8393 Asymptomatic varicose veins of bilateral lower extremities: Secondary | ICD-10-CM

## 2017-09-06 DIAGNOSIS — E034 Atrophy of thyroid (acquired): Secondary | ICD-10-CM | POA: Diagnosis not present

## 2017-09-06 DIAGNOSIS — F419 Anxiety disorder, unspecified: Secondary | ICD-10-CM

## 2017-09-06 DIAGNOSIS — D239 Other benign neoplasm of skin, unspecified: Secondary | ICD-10-CM

## 2017-09-06 HISTORY — DX: Asymptomatic varicose veins of bilateral lower extremities: I83.93

## 2017-09-06 LAB — TSH: TSH: 0.65 mIU/L

## 2017-09-06 MED ORDER — DULOXETINE HCL 20 MG PO CPEP: 40.0000 mg | ORAL_CAPSULE | Freq: Every day | ORAL | 0 refills | Status: DC

## 2017-09-06 NOTE — Assessment & Plan Note (Signed)
With recent weight gain; check TSH and adjust medicine if needed

## 2017-09-06 NOTE — Assessment & Plan Note (Signed)
Has been seen by ortho, PT

## 2017-09-06 NOTE — Assessment & Plan Note (Signed)
Will increase the cymbalta from 20 mg daily to 40 mg daily; return for f/u in a few weeks, call sooner if problems; recommended the Relaxation Response, brief demonstration; see AVS

## 2017-09-06 NOTE — Telephone Encounter (Signed)
Patient thought you were going to send her to a Vein specialist I do not see referral?  Late afternoon School  teacher

## 2017-09-06 NOTE — Progress Notes (Signed)
BP 124/86   Pulse 100   Temp (!) 97.5 F (36.4 C) (Oral)   Resp 16   Wt 220 lb 11.2 oz (100.1 kg)   LMP 06/12/2016   SpO2 98%   BMI 35.62 kg/m    Subjective:    Patient ID: Nicole Osborn, female    DOB: 12-31-1970, 46 y.o.   MRN: [id number]  HPI: Nicole Osborn is a 46 y.o. female  Chief Complaint  Patient presents with  . Foot Swelling    bilateral inside feet/veins  . Depression    and anxiety, was setup to see pscyh but never went.  When she went to nueurologist he placed her on cymballta 20mg  qd  and wants to see if dose is correct or if she needs something stonger.  . urine leakage    urinary frequency.  Went to urologist and was refered to pelvic floor dysfunction.   . skin check    lesion on left arm    HPI She has some swelling in her feet, since July or August No pain Just visual, can see when she has her feet on the floor and looks down Really sees from above She had been seeing pelvic physical therapist; helped with ankle too; better since  She had been to orthopaedist and started PT then broke the ankle the next week  She has depression and anxiety; was having some anxiety attacks last year, related to work; Museum/gallery curator strain as well; saw work Social worker, Teacher, music, then 4 hour psych eval with Acquanetta Chain; then was supposed to go back to psychiatrist, then missed the f/u appt; trouble keeping up with things; depression limits organization; did not think ADHD; has some tendencies but not full blown; more because of the type of depression she has; they dropped her b/c she didn't go back; she saw Dr. Manuella Ghazi this summer (neurologist, for migraines); he asked about prescribing something; duloxetine; had an anxiety attack yesterday; bad financial situation, but overall still having trouble prioritizing   Has a skin lesion on her left arm; she used to pick at it; not really growing  Urinary leakage; still leaking quite a bit, started leaking more  Hypothyroidism;  dose was reduced and she has gained weight; had migraine 3 weeks after dose change; lawyer talked to her about applying for disability for migraines; uncles took depakote Lab Results  Component Value Date   TSH 0.17 (L) 07/08/2017   T4TOTAL 7.5 02/02/2016   Depression screen [facility] 2/9 09/06/2017 12/07/2016 10/12/2016 04/23/2016  Decreased Interest 1 0 0 0  Down, Depressed, Hopeless 1 0 1 0  PHQ - 2 Score 2 0 1 0  Altered sleeping 3 - - -  Tired, decreased energy 1 - - -  Change in appetite 1 - - -  Feeling bad or failure about yourself  1 - - -  Trouble concentrating 2 - - -  Moving slowly or fidgety/restless 0 - - -  Suicidal thoughts 0 - - -  PHQ-9 Score 10 - - -  Difficult doing work/chores Very difficult - - -  no SI/HI  Relevant past medical, surgical, family and social history reviewed Past Medical History:  Diagnosis Date  . Anxiety   . Arthritis   . Depression   . Heartburn   . Hyperlipidemia   . Hypertension   . Kidney stone   . Migraines    without aura  . Obesity   . Thyroid disease    Past Surgical History:  Procedure Laterality Date  . Removal of extra tooth     Family History  Problem Relation Age of Onset  . Depression Mother   . Arthritis Mother   . Stroke Father   . Hypertension Father   . Breast cancer Paternal Aunt 72  . Emphysema Maternal Grandmother   . Emphysema Maternal Grandfather   . Dementia Paternal Grandmother   . Asthma Son   . Eczema Son   . Kidney disease Neg Hx   . Bladder Cancer Neg Hx    Social History   Tobacco Use  . Smoking status: Never Smoker  . Smokeless tobacco: Never Used  Substance Use Topics  . Alcohol use: No  . Drug use: No    Interim medical history since last visit reviewed. Allergies and medications reviewed  Review of Systems Per HPI unless specifically indicated above     Objective:    BP 124/86   Pulse 100   Temp (!) 97.5 F (36.4 C) (Oral)   Resp 16   Wt 220 lb 11.2 oz (100.1 kg)   LMP  06/12/2016   SpO2 98%   BMI 35.62 kg/m   Wt Readings from Last 3 Encounters:  09/06/17 220 lb 11.2 oz (100.1 kg)  06/23/17 210 lb (95.3 kg)  12/07/16 215 lb 4.8 oz (97.7 kg)    Physical Exam  Constitutional: She appears well-developed and well-nourished. No distress.  Obese, weight gain noted  HENT:  Head: Normocephalic and atraumatic.  Eyes: EOM are normal. No scleral icterus.  Neck: No thyromegaly present.  Cardiovascular: Normal rate, regular rhythm and normal heart sounds.  No murmur heard. Pulmonary/Chest: Effort normal and breath sounds normal. No respiratory distress. She has no wheezes.  Abdominal: Soft. Bowel sounds are normal. She exhibits no distension.  Musculoskeletal: Normal range of motion. She exhibits no edema.       Left ankle: She exhibits swelling and deformity.   Fluid along the lateral malleolus, below and posterior on the LEFT; no erythema; no overlying skin change; there is soft compressible areas of swelling along the medial aspect of both feet, LEFT worse than right; consistency is soft like varicose veins or soft lipoma; no hard aspect; nontender; no overlying erythema  Neurological: She is alert. She exhibits normal muscle tone.  Skin: Skin is warm and dry. She is not diaphoretic. No pallor.  Spider veins on the legs; 5 mm firm papule, slightly erythematous without telangiectasia; LEFT upper forearm; positive Fitzpatrick's sign  Psychiatric: She has a normal mood and affect. Her behavior is normal. Judgment and thought content normal.    Results for orders placed or performed in visit on 07/01/17  TSH  Result Value Ref Range   TSH 0.17 (L) mIU/L  T4, free  Result Value Ref Range   Free T4 1.3 0.8 - 1.8 ng/dL      Assessment & Plan:   Problem List Items Addressed This Visit      Cardiovascular and Mediastinum   Varicose veins of both lower extremities    Refer to vascular to evaluate the swelling, may be varicose veins; if not, then consider  imaging, but bilateral; horse chestnut and compression stockings      Relevant Orders   Ambulatory referral to Vascular Surgery     Endocrine   Hypothyroidism    Showed her last low TSH, rationale for the change in dose; recheck today, further adjustment if needed        Other   Urinary frequency  Recommended avoiding all foods/drinks containing methylxanthines; taper down if abrupt discontinuation would trigger headaches      Swelling of left ankle joint    Has been seen by ortho, PT      Obesity    With recent weight gain; check TSH and adjust medicine if needed      Anxiety - Primary    Will increase the cymbalta from 20 mg daily to 40 mg daily; return for f/u in a few weeks, call sooner if problems; recommended the Relaxation Response, brief demonstration; see AVS      Relevant Medications   DULoxetine (CYMBALTA) 20 MG capsule    Other Visit Diagnoses    Dermatofibroma       likely dx of lesion on LEFT forearm; watch, notify me if growing, bleeding, etc       Follow up plan: Return in about 3 weeks (around 09/27/2017) for follow-up visit with Dr. Sanda Klein.  An after-visit summary was printed and given to the patient at [facility].  Please see the patient instructions which may contain other information and recommendations beyond what is mentioned above in the assessment and plan.  Meds ordered this encounter  Medications  . DULoxetine (CYMBALTA) 20 MG capsule    Sig: Take 2 capsules (40 mg total) by mouth daily.    Dispense:  60 capsule    Refill:  0    Orders Placed This Encounter  Procedures  . Ambulatory referral to Vascular Surgery

## 2017-09-06 NOTE — Assessment & Plan Note (Signed)
Refer to vascular to evaluate the swelling, may be varicose veins; if not, then consider imaging, but bilateral; horse chestnut and compression stockings

## 2017-09-06 NOTE — Telephone Encounter (Signed)
Yes, thank you! I'll add it to her note

## 2017-09-06 NOTE — Patient Instructions (Addendum)
Horse chestnut supplement for vein health Get 18 mmHg compression stockings, the next strength is 20-30 mmHg is the next higher strength Put the stockings on first thing in the morning and remove before bed time; do not sleep in them I think the spot on your left arm is a dermatofibroma; if it grows or bleeds or domes up, then let me know and we'll have a dermatologist check that out Increase the duloxetine to 40 mg daily Try completely cutting out all chocolate, coffee, caffeinated drinks, and tea for one month, as those can all be bladder irritants Consider talking with Dr. Manuella Ghazi about the new injectables for migraines We'll check the thyroid level   Steps to Elicit the Relaxation Response The following is the technique reprinted with permission from Dr. Billie Ruddy book The Relaxation Response pages 162-163 1. Sit quietly in a comfortable position. 2. Close your eyes. 3. Deeply relax all your muscles,  beginning at your feet and progressing up to your face.  Keep them relaxed. 4. Breathe through your nose.  Become aware of your breathing.  As you breathe out, say the word, "one"*,  silently to yourself. For example,  breathe in ... out, "one",- in .. out, "one", etc.  Breathe easily and naturally. 5. Continue for 10 to 20 minutes.  You may open your eyes to check the time, but do not use an alarm.  When you finish, sit quietly for several minutes,  at first with your eyes closed and later with your eyes opened.  Do not stand up for a few minutes. 6. Do not worry about whether you are successful  in achieving a deep level of relaxation.  Maintain a passive attitude and permit relaxation to occur at its own pace.  When distracting thoughts occur,  try to ignore them by not dwelling upon them  and return to repeating "one."  With practice, the response should come with little effort.  Practice the technique once or twice daily,  but not within two hours after any meal,   since the digestive processes seem to interfere with  the elicitation of the Relaxation Response. * It is better to use a soothing, mellifluous sound, preferably with no meaning. or association, to avoid stimulation of unnecessary thoughts - a mantra.

## 2017-09-06 NOTE — Assessment & Plan Note (Signed)
Showed her last low TSH, rationale for the change in dose; recheck today, further adjustment if needed

## 2017-09-06 NOTE — Assessment & Plan Note (Signed)
Recommended avoiding all foods/drinks containing methylxanthines; taper down if abrupt discontinuation would trigger headaches

## 2017-09-07 ENCOUNTER — Other Ambulatory Visit: Payer: Self-pay | Admitting: Family Medicine

## 2017-09-07 MED ORDER — LEVOTHYROXINE SODIUM 112 MCG PO TABS: 112.0000 ug | ORAL_TABLET | Freq: Every day | ORAL | 2 refills | Status: DC

## 2017-09-11 ENCOUNTER — Ambulatory Visit
Admission: RE | Admit: 2017-09-11 | Discharge: 2017-09-11 | Disposition: A | Discharge status: Home / Self Care | Payer: BC Managed Care – PPO | Source: Ambulatory Visit | Attending: Family Medicine | Admitting: Family Medicine

## 2017-09-11 DIAGNOSIS — Z1231 Encounter for screening mammogram for malignant neoplasm of breast: Secondary | ICD-10-CM

## 2017-09-18 ENCOUNTER — Encounter: Payer: Self-pay | Admitting: Family Medicine

## 2017-09-18 ENCOUNTER — Other Ambulatory Visit: Payer: Self-pay

## 2017-09-18 ENCOUNTER — Emergency Department: Payer: BC Managed Care – PPO

## 2017-09-18 ENCOUNTER — Emergency Department
Admission: EM | Admit: 2017-09-18 | Discharge: 2017-09-18 | Disposition: A | Discharge status: Home / Self Care | Payer: BC Managed Care – PPO | Attending: Emergency Medicine | Admitting: Emergency Medicine

## 2017-09-18 ENCOUNTER — Ambulatory Visit: Payer: BC Managed Care – PPO | Admitting: Family Medicine

## 2017-09-18 VITALS — BP 126/84 | HR 105 | Temp 97.6°F | Ht 66.0 in | Wt 221.4 lb

## 2017-09-18 DIAGNOSIS — M546 Pain in thoracic spine: Secondary | ICD-10-CM

## 2017-09-18 DIAGNOSIS — E039 Hypothyroidism, unspecified: Secondary | ICD-10-CM | POA: Insufficient documentation

## 2017-09-18 DIAGNOSIS — R079 Chest pain, unspecified: Secondary | ICD-10-CM

## 2017-09-18 DIAGNOSIS — R1013 Epigastric pain: Secondary | ICD-10-CM

## 2017-09-18 DIAGNOSIS — Z79899 Other long term (current) drug therapy: Secondary | ICD-10-CM | POA: Insufficient documentation

## 2017-09-18 DIAGNOSIS — K8020 Calculus of gallbladder without cholecystitis without obstruction: Secondary | ICD-10-CM | POA: Insufficient documentation

## 2017-09-18 DIAGNOSIS — I10 Essential (primary) hypertension: Secondary | ICD-10-CM | POA: Diagnosis not present

## 2017-09-18 DIAGNOSIS — K8050 Calculus of bile duct without cholangitis or cholecystitis without obstruction: Secondary | ICD-10-CM

## 2017-09-18 LAB — COMPREHENSIVE METABOLIC PANEL
ALT: 18 U/L (ref 14–54)
ANION GAP: 6 (ref 5–15)
AST: 21 U/L (ref 15–41)
Albumin: 4.3 g/dL (ref 3.5–5.0)
Alkaline Phosphatase: 97 U/L (ref 38–126)
BUN: 11 mg/dL (ref 6–20)
CHLORIDE: 105 mmol/L (ref 101–111)
CO2: 27 mmol/L (ref 22–32)
CREATININE: 0.81 mg/dL (ref 0.44–1.00)
Calcium: 9.3 mg/dL (ref 8.9–10.3)
GFR calc non Af Amer: >60 mL/min (ref >60)
Glucose, Bld: 102 mg/dL — ABNORMAL HIGH (ref 65–99)
Potassium: 3.7 mmol/L (ref 3.5–5.1)
Sodium: 138 mmol/L (ref 135–145)
Total Bilirubin: 0.6 mg/dL (ref 0.3–1.2)
Total Protein: 8 g/dL (ref 6.5–8.1)

## 2017-09-18 LAB — URINALYSIS, COMPLETE (UACMP) WITH MICROSCOPIC
Bacteria, UA: NONE SEEN
Bilirubin Urine: NEGATIVE
GLUCOSE, UA: NEGATIVE mg/dL
HGB URINE DIPSTICK: NEGATIVE
KETONES UR: NEGATIVE mg/dL
Nitrite: NEGATIVE
PROTEIN: NEGATIVE mg/dL
Specific Gravity, Urine: 1.01 (ref 1.005–1.030)
pH: 7 (ref 5.0–8.0)

## 2017-09-18 LAB — CBC
HCT: 41.7 % (ref 35.0–47.0)
HEMOGLOBIN: 14.1 g/dL (ref 12.0–16.0)
MCH: 29.8 pg (ref 26.0–34.0)
MCHC: 33.7 g/dL (ref 32.0–36.0)
MCV: 88.6 fL (ref 80.0–100.0)
PLATELETS: 250 10*3/uL (ref 150–440)
RBC: 4.71 MIL/uL (ref 3.80–5.20)
RDW: 13.4 % (ref 11.5–14.5)
WBC: 7.9 10*3/uL (ref 3.6–11.0)

## 2017-09-18 LAB — LIPASE, BLOOD: LIPASE: 26 U/L (ref 11–51)

## 2017-09-18 LAB — TROPONIN I: Troponin I: <0.03 ng/mL (ref <0.03)

## 2017-09-18 MED ORDER — OXYCODONE-ACETAMINOPHEN 5-325 MG PO TABS: 1.0000 | ORAL_TABLET | Freq: Four times a day (QID) | ORAL | 0 refills | Status: DC | PRN

## 2017-09-18 NOTE — ED Provider Notes (Signed)
[facility] Emergency Department Provider Note   ____________________________________________    I have reviewed the triage vital signs and the nursing notes.   HISTORY  Chief Complaint Abdominal Pain     HPI Nicole Osborn is a 46 y.o. female who presents with complaints of abdominal pain approximately 8 times over the last month.  She describes it as epigastric pain which gradually worsens does seem to be worse on the right upper abdomen.  Occasionally it radiates to her back.  When this happens she takes a hot shower and that seems to help somewhat.  She did see her PCP today who recommended she come to the emergency department.  No fevers or chills.  No diarrhea.  No history of abdominal surgery  Past Medical History:  Diagnosis Date  . Anxiety   . Arthritis   . Depression   . Heartburn   . Hyperlipidemia   . Hypertension   . Kidney stone   . Migraines    without aura  . Obesity   . Thyroid disease     Patient Active Problem List   Diagnosis Date Noted  . Varicose veins of both lower extremities 09/06/2017  . Hyperlipidemia 12/23/2016  . Anxiety 12/23/2016  . Lung nodule 12/11/2016  . Increased frequency of headaches 04/28/2016  . Urinary frequency 04/23/2016  . Swelling of left ankle joint 04/23/2016  . Hypothyroidism 04/23/2016  . Obesity 04/23/2016  . Rash 04/23/2016  . Missed period 04/23/2016    Past Surgical History:  Procedure Laterality Date  . Removal of extra tooth      Prior to Admission medications   Medication Sig Start Date End Date Taking? Authorizing Provider  Cholecalciferol (VITAMIN D3) 2000 units capsule Take by mouth.    [provider]  DULoxetine (CYMBALTA) 20 MG capsule Take 2 capsules (40 mg total) by mouth daily. 09/06/17   Arnetha Courser, MD  Ibuprofen-Diphenhydramine Cit (IBUPROFEN PM PO) Take by mouth.    [provider]  levothyroxine (SYNTHROID, LEVOTHROID) 112 MCG tablet Take  1 tablet (112 mcg total) by mouth daily before breakfast. 09/07/17   Lada, Satira Anis, MD  Magnesium 250 MG TABS Take by mouth.    [provider]  Multiple Vitamin (MULTI-VITAMINS) TABS Take by mouth.    [provider]  oxyCODONE-acetaminophen (ROXICET) 5-325 MG tablet Take 1 tablet by mouth every 6 (six) hours as needed for severe pain. 09/18/17 09/18/18  Lavonia Drafts, MD  SUMAtriptan (IMITREX) 100 MG tablet One by mouth at onset of migraine; may repeat one pill 2 hours later if absolutely needed; max 2 pills in 24 hours 08/27/15   Lada, Satira Anis, MD  Turmeric 500 MG CAPS Take 500 mg by mouth daily.    [provider]     Allergies Cefaclor  Family History  Problem Relation Age of Onset  . Depression Mother   . Arthritis Mother   . Stroke Father   . Hypertension Father   . Breast cancer Paternal Aunt 33  . Emphysema Maternal Grandmother   . Emphysema Maternal Grandfather   . Dementia Paternal Grandmother   . Asthma Son   . Eczema Son   . Kidney disease Neg Hx   . Bladder Cancer Neg Hx     Social History Social History   Tobacco Use  . Smoking status: Never Smoker  . Smokeless tobacco: Never Used  Substance Use Topics  . Alcohol use: No  . Drug use: No  Review of Systems  Constitutional: No fever/chills Eyes: No visual changes.  ENT: No sore throat. Cardiovascular: One episode of chest tightness, none today Respiratory: Denies shortness of breath. Gastrointestinal: As above Genitourinary: Negative for dysuria. Musculoskeletal: Negative for back pain. Skin: Negative for rash. Neurological: Negative for headaches   ____________________________________________   PHYSICAL EXAM:  VITAL SIGNS: ED Triage Vitals  Enc Vitals Group     BP 09/18/17 1751 (!) 143/97     Pulse Rate 09/18/17 1751 77     Resp 09/18/17 1751 16     Temp 09/18/17 1751 98 F (36.7 C)     Temp Source 09/18/17 1751 Oral     SpO2 09/18/17 1751 99 %      Weight 09/18/17 1752 100.2 kg (221 lb)     Height 09/18/17 1752 1.676 m (5\' 6" )     Head Circumference --      Peak Flow --      Pain Score 09/18/17 1751 0     Pain Loc --      Pain Edu? --      Excl. in [place]? --     Constitutional: Alert and oriented. No acute distress. Pleasant and interactive Eyes: Conjunctivae are normal.  . Nose: No congestion/rhinnorhea. Mouth/Throat: Mucous membranes are moist.    Cardiovascular: Normal rate, regular rhythm. Grossly normal heart sounds.  Good peripheral circulation. Respiratory: Normal respiratory effort.  No retractions. Lungs CTAB. Gastrointestinal: Minimal discomfort to palpation right upper quadrant. No distention.  No CVA tenderness. Genitourinary: deferred Musculoskeletal:   Warm and well perfused Neurologic:  Normal speech and language. No gross focal neurologic deficits are appreciated.  Skin:  Skin is warm, dry and intact. No rash noted. Psychiatric: Mood and affect are normal. Speech and behavior are normal.  ____________________________________________   LABS (all labs ordered are listed, but only abnormal results are displayed)  Labs Reviewed  COMPREHENSIVE METABOLIC PANEL - Abnormal; Notable for the following components:      Result Value   Glucose, Bld 102 (*)    All other components within normal limits  URINALYSIS, COMPLETE (UACMP) WITH MICROSCOPIC - Abnormal; Notable for the following components:   Color, Urine YELLOW (*)    APPearance CLEAR (*)    Leukocytes, UA TRACE (*)    Squamous Epithelial / LPF 0-5 (*)    All other components within normal limits  LIPASE, BLOOD  CBC  TROPONIN I   ____________________________________________  EKG  Reviewed EKG from outpatient office today, normal ____________________________________________  RADIOLOGY  Ultrasound shows gallbladder sludge and gallstones ____________________________________________   PROCEDURES  Procedure(s) performed:  No  Procedures   Critical Care performed: No ____________________________________________   INITIAL IMPRESSION / ASSESSMENT AND PLAN / ED COURSE  Pertinent labs & imaging results that were available during my care of the patient were reviewed by me and considered in my medical decision making (see chart for details).  Patient presents with epigastric abdominal pain that is intermittent.  Differential diagnosis includes cholelithiasis, pancreatitis, PUD, less likely anginal pain  Ultrasound demonstrates numerous gallstones and gallbladder sludge with some wall thickening, consistent with biliary colic.  Will refer to outpatient surgery as the patient is quite comfortable at this time.  Pain Rx given    ____________________________________________   FINAL CLINICAL IMPRESSION(S) / ED DIAGNOSES  Final diagnoses:  Epigastric abdominal pain  Biliary colic        Note:  This document was prepared using Dragon voice recognition software and may include unintentional dictation errors.  Lavonia Drafts, MD 09/18/17 2139

## 2017-09-18 NOTE — Progress Notes (Signed)
BP 126/84 (BP Location: Left Arm, Patient Position: Sitting, Cuff Size: Large)   Pulse (!) 105   Temp 97.6 F (36.4 C) (Oral)   Ht 5\' 6"  (1.676 m)   Wt 221 lb 6.4 oz (100.4 kg)   LMP 06/12/2016   SpO2 98%   BMI 35.73 kg/m    Subjective:    Patient ID: Nicole Osborn, female    DOB: 1971-03-30, 46 y.o.   MRN: [id number]  HPI: Nicole Osborn is a 46 y.o. female  Chief Complaint  Patient presents with  . Abdominal Pain    Upper abdominal pain extending to back and caused some chest tightness first started Dec. 5 , burping and has gas mainly at night, lasting 3-4 hours, cramping has been so bad that she vomitted last night once, tums do not help nor heat     HPI Patient is here for an acute visit; the visit was labeled as "indigestion"  She has happened 2-3 times before, thought maybe her stomach was working something out One on Dec 5th, then Dec 10th, then last night 8 times total though in all between Dec 5th and last night Always at night except one was 3-4 pm, usually 7-9 pm She had not eaten for 3 hours or so when these happen; not after eating; not feeling hungry Upper abdominal pain, "extreme" and "back pain and chest pain and burping and gas" Burping and gas don't relieve it Stomach is epigastric; hurts here and makes a fist Clovis Riley sign) and mid to upper back pain Heating pad doesn't help Tums doesn't help No pain at all right now Fine right now Last night she actually threw up, heavier than normal supper; Christmas dinner; tightness like a labor pain without any relief eipsodes last 3-4 hours; gets in the hot shower and let the water run on her back and stomach; feels like it stems from her stomach The pain was so bad last night that she was panting, could catch her breath, more panting to deal with pain, like breathing through labor contractions  Reviewed the CXR and the CT of chest  Depression screen [facility] 2/9 09/06/2017 12/07/2016 10/12/2016 04/23/2016  Decreased  Interest 1 0 0 0  Down, Depressed, Hopeless 1 0 1 0  PHQ - 2 Score 2 0 1 0  Altered sleeping 3 - - -  Tired, decreased energy 1 - - -  Change in appetite 1 - - -  Feeling bad or failure about yourself  1 - - -  Trouble concentrating 2 - - -  Moving slowly or fidgety/restless 0 - - -  Suicidal thoughts 0 - - -  PHQ-9 Score 10 - - -  Difficult doing work/chores Very difficult - - -    Relevant past medical, surgical, family and social history reviewed Past Medical History:  Diagnosis Date  . Anxiety   . Arthritis   . Depression   . Heartburn   . Hyperlipidemia   . Hypertension   . Kidney stone   . Migraines    without aura  . Obesity   . Thyroid disease    Past Surgical History:  Procedure Laterality Date  . Removal of extra tooth     Family History  Problem Relation Age of Onset  . Depression Mother   . Arthritis Mother   . Stroke Father   . Hypertension Father   . Breast cancer Paternal Aunt 58  . Emphysema Maternal Grandmother   . Emphysema Maternal  Grandfather   . Dementia Paternal Grandmother   . Asthma Son   . Eczema Son   . Kidney disease Neg Hx   . Bladder Cancer Neg Hx    Social History   Tobacco Use  . Smoking status: Never Smoker  . Smokeless tobacco: Never Used  Substance Use Topics  . Alcohol use: No  . Drug use: No    Interim medical history since last visit reviewed. Allergies and medications reviewed  Review of Systems Per HPI unless specifically indicated above     Objective:    BP 126/84 (BP Location: Left Arm, Patient Position: Sitting, Cuff Size: Large)   Pulse (!) 105   Temp 97.6 F (36.4 C) (Oral)   Ht 5\' 6"  (1.676 m)   Wt 221 lb 6.4 oz (100.4 kg)   LMP 06/12/2016   SpO2 98%   BMI 35.73 kg/m   Wt Readings from Last 3 Encounters:  09/18/17 221 lb (100.2 kg)  09/18/17 221 lb 6.4 oz (100.4 kg)  09/06/17 220 lb 11.2 oz (100.1 kg)    Physical Exam  Constitutional: She appears well-developed and well-nourished.  HENT:    Mouth/Throat: Mucous membranes are normal.  Eyes: EOM are normal. No scleral icterus.  Neck: No JVD present.  Cardiovascular: Normal rate and regular rhythm.  Pulmonary/Chest: Effort normal and breath sounds normal.  Abdominal: Soft. Bowel sounds are normal. She exhibits no distension and no mass. There is tenderness in the right upper quadrant and epigastric area. There is positive Murphy's sign. There is no rigidity and no guarding.  Musculoskeletal: She exhibits no edema.  Neurological: She is alert.  Skin: She is not diaphoretic. No pallor.  No jaundice  Psychiatric: She has a normal mood and affect. Her behavior is normal.    Results for orders placed or performed in visit on 09/06/17  TSH  Result Value Ref Range   TSH 0.65 mIU/L      Assessment & Plan:   Problem List Items Addressed This Visit    None    Visit Diagnoses    Epigastric pain    -  Primary   discussed ddx including gastritis, pancreatitis, atypical angina, and we discussed ER work-up vs outpatient work-up; she'll go to ER   Relevant Orders   COMPLETE METABOLIC PANEL WITH GFR   CBC with Differential/Platelet   Amylase   Lipase   CT ABDOMEN W CONTRAST   Acute midline thoracic back pain       reviewed previous CT and CXR; normal size thoracic aorta; ddx includes angina, pancreatitis, cholecystitis, gastritis, ulcer, etc; to ER   Relevant Orders   CT ABDOMEN W CONTRAST   Chest pain, unspecified type       atypical pain, but patient did demonstrate a Levine's sign (fist over the anterior chest); will send over to ER, refer to cards   Relevant Orders   Troponin I   DG Chest 2 View   Ambulatory referral to Cardiology      Follow up plan: No Follow-up on file.  An after-visit summary was printed and given to the patient at [place].  Please see the patient instructions which may contain other information and recommendations beyond what is mentioned above in the assessment and plan.  No orders of the  defined types were placed in this encounter.   Orders Placed This Encounter  Procedures  . DG Chest 2 View  . CT ABDOMEN W CONTRAST  . COMPLETE METABOLIC PANEL WITH GFR  .  CBC with Differential/Platelet  . Amylase  . Lipase  . Troponin I  . Ambulatory referral to Cardiology

## 2017-09-18 NOTE — ED Notes (Signed)
Pt saw PCP today and the PCP advised her to come to ED - pt reports severe abd pain since December 5th that comes and goes in the evening and the pain radiated into her back and she has burping and gas - she cannot find relief with any medication or treatments

## 2017-09-18 NOTE — ED Triage Notes (Signed)
Pt c/o upper abd pain every night around 7-8pm that last for about 4hrs that has been happening over the past 3 weeks. States only had vomiting once last night, none for the past past 3 weeks.Marland Kitchen

## 2017-09-18 NOTE — Patient Instructions (Addendum)
Go now to the ER.

## 2017-09-19 ENCOUNTER — Telehealth: Payer: Self-pay | Admitting: Family Medicine

## 2017-09-19 DIAGNOSIS — K8010 Calculus of gallbladder with chronic cholecystitis without obstruction: Secondary | ICD-10-CM

## 2017-09-19 DIAGNOSIS — K8012 Calculus of gallbladder with acute and chronic cholecystitis without obstruction: Secondary | ICD-10-CM

## 2017-09-19 HISTORY — DX: Calculus of gallbladder with chronic cholecystitis without obstruction: K80.10

## 2017-09-19 NOTE — Assessment & Plan Note (Signed)
Refer to surgeon. 

## 2017-09-19 NOTE — Telephone Encounter (Signed)
I spoke with patient Nicole Osborn was given an appt with Dr. Burt Knack, though Dr. Ellin Mayhew was on-call Nicole Osborn texted Threasa Beards Vater to see if [facility] can get her in sooner I'll put in referral to see if Dr. Burt Knack can see her sooner Nicole Osborn'll avoid fatty foods in the meantime

## 2017-09-20 ENCOUNTER — Ambulatory Visit: Payer: BC Managed Care – PPO

## 2017-09-20 ENCOUNTER — Encounter: Payer: Self-pay | Admitting: Family Medicine

## 2017-09-20 ENCOUNTER — Telehealth: Payer: Self-pay

## 2017-09-20 NOTE — Telephone Encounter (Signed)
I tried to contact this patient to see if she still wanted to proceed with the STAT CT Abdomen w/ contrast, but there was no answer. A message was left for her to give our office a call to let us know how she wants to proceed being that she has been seen at the ER and an [place] has already been performed.

## 2017-09-23 DIAGNOSIS — G43909 Migraine, unspecified, not intractable, without status migrainosus: Secondary | ICD-10-CM | POA: Insufficient documentation

## 2017-09-23 DIAGNOSIS — I10 Essential (primary) hypertension: Secondary | ICD-10-CM | POA: Insufficient documentation

## 2017-09-23 DIAGNOSIS — F32A Depression, unspecified: Secondary | ICD-10-CM | POA: Insufficient documentation

## 2017-09-23 DIAGNOSIS — F329 Major depressive disorder, single episode, unspecified: Secondary | ICD-10-CM | POA: Insufficient documentation

## 2017-09-23 HISTORY — DX: Migraine, unspecified, not intractable, without status migrainosus: G43.909

## 2017-09-26 ENCOUNTER — Encounter: Payer: Self-pay | Admitting: Surgery

## 2017-09-26 ENCOUNTER — Ambulatory Visit: Payer: BC Managed Care – PPO | Admitting: Surgery

## 2017-09-26 VITALS — BP 136/73 | HR 100 | Temp 97.9°F | Ht 66.0 in | Wt 221.6 lb

## 2017-09-26 DIAGNOSIS — K8050 Calculus of bile duct without cholangitis or cholecystitis without obstruction: Secondary | ICD-10-CM | POA: Diagnosis not present

## 2017-09-26 NOTE — Progress Notes (Signed)
Nicole Osborn is an 47 y.o. female.   Chief Complaint:  Biliary colic HPI: This is a patient who was in the emergency room referred over by Dr. Corky Downs with recurrent and episodic right upper quadrant pain.  Workup in the ED showed biliary colic with gallstones and sludge but normal liver function tests. Over the past month she has had several episodes of this the worst being on Christmas where she vomited one time.  She denies jaundice or acholic stools no fevers or chills  She has a family history of gallbladder disease. 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 Anxiety   . Arthritis   . Cholecystitis with cholelithiasis 09/19/2017  . Depression   . Heartburn   . Hyperlipidemia   . Hypertension   . Kidney stone   . Migraine 09/23/2017  . Migraines    without aura  . Obesity   . Thyroid disease   . Varicose veins of both lower extremities 09/06/2017    Past Surgical History:  Procedure Laterality Date  . Removal of extra tooth      Family History  Problem Relation Age of Onset  . Depression Mother   . Arthritis Mother   . Stroke Father   . Hypertension Father   . Breast cancer Paternal Aunt 81  . Emphysema Maternal Grandmother   . Emphysema Maternal Grandfather   . Dementia Paternal Grandmother   . Asthma Son   . Eczema Son   . Kidney disease Neg Hx   . Bladder Cancer Neg Hx    Social History:  reports that  has never smoked. she has never used smokeless tobacco. She reports that she does not drink alcohol or use drugs.  Allergies:  Allergies  Allergen Reactions  . Cefaclor Swelling     (Not in a hospital admission)   Review of Systems:   Review of Systems  Constitutional: Negative for chills and fever.  HENT: Negative.   Eyes: Negative.   Respiratory: Negative.   Cardiovascular: Negative.   Gastrointestinal: Positive for abdominal pain, nausea and vomiting. Negative for constipation, diarrhea and heartburn.   Genitourinary: Negative.   Musculoskeletal: Negative.   Skin: Negative.   Neurological: Negative.   Endo/Heme/Allergies: Negative.   Psychiatric/Behavioral: Negative.     Physical Exam:  Physical Exam  Constitutional: She is oriented to person, place, and time and well-developed, well-nourished, and in no distress. No distress.  HENT:  Head: Normocephalic and atraumatic.  Eyes: Pupils are equal, round, and reactive to light. Right eye exhibits no discharge. Left eye exhibits no discharge. No scleral icterus.  Neck: Normal range of motion.  Cardiovascular: Normal rate, regular rhythm and normal heart sounds.  Pulmonary/Chest: Effort normal and breath sounds normal. No respiratory distress. She has no wheezes. She has no rales.  Abdominal: Soft. She exhibits no distension. There is no tenderness. There is no rebound and no guarding.  Musculoskeletal: Normal range of motion. She exhibits edema. She exhibits no tenderness.  Lymphadenopathy:    She has no cervical adenopathy.  Neurological: She is alert and oriented to person, place, and time.  Skin: Skin is warm and dry. No rash noted. She is not diaphoretic. No erythema.  Psychiatric: Mood and affect normal.  Vitals reviewed.   Blood pressure 136/73, pulse 100, temperature 97.9 F (36.6 C), temperature source Oral, height 5\' 6"  (1.676 m), weight 221 lb 9.6 oz (100.5 kg), last menstrual period 06/12/2016.  No results found for this or any previous visit (from the past 48 hour(s)). No results found.   Assessment/Plan Ultrasound shows gallstones and sludge.  Liver function tests are normal and personally reviewed.  Discussed with patient the options concerning biliary colic.  Discussed surgical options.  The risks of bleeding infection recurrence of symptoms conversion to an open procedure bile duct damage bile duct leak retained common bile duct stone or bowel injury were all reviewed with her she understood and agreed with the  plan for surgical intervention.  Florene Glen, MD, FACS

## 2017-09-26 NOTE — Patient Instructions (Signed)
You have requested to have your gallbladder removed. This will be done on 10/08/2017 at [facility] with Dr. Burt Knack.  You will most likely be out of work 1-2 weeks for this surgery. You will return after your post-op appointment with a lifting restriction for approximately 4 more weeks.  You will be able to eat anything you would like to following surgery. But, start by eating a bland diet and advance this as tolerated. The Gallbladder diet is below, please go as closely by this diet as possible prior to surgery to avoid any further attacks.  Please see the (blue)pre-care form that you have been given today. If you have any questions, please call our office.  Laparoscopic Cholecystectomy Laparoscopic cholecystectomy is surgery to remove the gallbladder. The gallbladder is located in the upper right part of the abdomen, behind the liver. It is a storage sac for bile, which is produced in the liver. Bile aids in the digestion and absorption of fats. Cholecystectomy is often done for inflammation of the gallbladder (cholecystitis). This condition is usually caused by a buildup of gallstones (cholelithiasis) in the gallbladder. Gallstones can block the flow of bile, and that can result in inflammation and pain. In severe cases, emergency surgery may be required. If emergency surgery is not required, you will have time to prepare for the procedure. Laparoscopic surgery is an alternative to open surgery. Laparoscopic surgery has a shorter recovery time. Your common bile duct may also need to be examined during the procedure. If stones are found in the common bile duct, they may be removed. LET [facility] CARE PROVIDER KNOW ABOUT:  Any allergies you have.  All medicines you are taking, including vitamins, herbs, eye drops, creams, and over-the-counter medicines.  Previous problems you or members of your family have had with the use of anesthetics.  Any blood disorders you have.  Previous  surgeries you have had.    Any medical conditions you have. RISKS AND COMPLICATIONS Generally, this is a safe procedure. However, problems may occur, including:  Infection.  Bleeding.  Allergic reactions to medicines.  Damage to other structures or organs.  A stone remaining in the common bile duct.  A bile leak from the cyst duct that is clipped when your gallbladder is removed.  The need to convert to open surgery, which requires a larger incision in the abdomen. This may be necessary if your surgeon thinks that it is not safe to continue with a laparoscopic procedure. BEFORE THE PROCEDURE  Ask your health care provider about:  Changing or stopping your regular medicines. This is especially important if you are taking diabetes medicines or blood thinners.  Taking medicines such as aspirin and ibuprofen. These medicines can thin your blood. Do not take these medicines before your procedure if your health care provider instructs you not to.  Follow instructions from your health care provider about eating or drinking restrictions.  Let your health care provider know if you develop a cold or an infection before surgery.  Plan to have someone take you home after the procedure.  Ask your health care provider how your surgical site will be marked or identified.  You may be given antibiotic medicine to help prevent infection. PROCEDURE  To reduce your risk of infection:  Your health care team will wash or sanitize their hands.  Your skin will be washed with soap.  An IV tube may be inserted into one of your veins.  You will be given a medicine to make  you fall asleep (general anesthetic).  A breathing tube will be placed in your mouth.  The surgeon will make several small cuts (incisions) in your abdomen.  A thin, lighted tube (laparoscope) that has a tiny camera on the end will be inserted through one of the small incisions. The camera on the laparoscope will send a  picture to a TV screen (monitor) in the operating room. This will give the surgeon a good view inside your abdomen.  A gas will be pumped into your abdomen. This will expand your abdomen to give the surgeon more room to perform the surgery.  Other tools that are needed for the procedure will be inserted through the other incisions. The gallbladder will be removed through one of the incisions.  After your gallbladder has been removed, the incisions will be closed with stitches (sutures), staples, or skin glue.  Your incisions may be covered with a bandage (dressing). The procedure may vary among health care providers and hospitals. AFTER THE PROCEDURE  Your blood pressure, heart rate, breathing rate, and blood oxygen level will be monitored often until the medicines you were given have worn off.  You will be given medicines as needed to control your pain.   This information is not intended to replace advice given to you by your health care provider. Make sure you discuss any questions you have with your health care provider.   Document Released: 09/10/2005 Document Revised: 06/01/2015 Document Reviewed: 04/22/2013 Elsevier Interactive Patient Education 2016 [place] Diet for Gallbladder Conditions A low-fat diet can be helpful if you have pancreatitis or a gallbladder condition. With these conditions, your pancreas and gallbladder have trouble digesting fats. A healthy eating plan with less fat will help rest your pancreas and gallbladder and reduce your symptoms. WHAT DO I NEED TO KNOW ABOUT THIS DIET?  Eat a low-fat diet.  Reduce your fat intake to less than 20-30% of your total daily calories. This is less than 50-60 g of fat per day.  Remember that you need some fat in your diet. Ask your dietician what your daily goal should be.  Choose nonfat and low-fat healthy foods. Look for the words "nonfat," "low fat," or "fat free."  As a guide, look on the label and  choose foods with less than 3 g of fat per serving. Eat only one serving.  Avoid alcohol.  Do not smoke. If you need help quitting, talk with your health care provider.  Eat small frequent meals instead of three large heavy meals. WHAT FOODS CAN I EAT? Grains Include healthy grains and starches such as potatoes, wheat bread, fiber-rich cereal, and brown rice. Choose whole grain options whenever possible. In adults, whole grains should account for 45-65% of your daily calories.  Fruits and Vegetables Eat plenty of fruits and vegetables. Fresh fruits and vegetables add fiber to your diet. Meats and Other Protein Sources Eat lean meat such as chicken and pork. Trim any fat off of meat before cooking it. Eggs, fish, and beans are other sources of protein. In adults, these foods should account for 10-35% of your daily calories. Dairy Choose low-fat milk and dairy options. Dairy includes fat and protein, as well as calcium.  Fats and Oils Limit high-fat foods such as fried foods, sweets, baked goods, sugary drinks.  Other Creamy sauces and condiments, such as mayonnaise, can add extra fat. Think about whether or not you need to use them, or use smaller amounts or low fat options.  WHAT FOODS ARE NOT RECOMMENDED?  High fat foods, such as:  [employer].  Ice cream.  [place] toast.  Sweet rolls.  Pizza.  Cheese bread.  Foods covered with batter, butter, creamy sauces, or cheese.  Fried foods.  Sugary drinks and desserts.  Foods that cause gas or bloating   This information is not intended to replace advice given to you by your health care provider. Make sure you discuss any questions you have with your health care provider.   Document Released: 09/15/2013 Document Reviewed: 09/15/2013 Elsevier Interactive Patient Education [employer].

## 2017-09-27 ENCOUNTER — Ambulatory Visit: Payer: BC Managed Care – PPO | Admitting: Family Medicine

## 2017-09-30 ENCOUNTER — Telehealth: Payer: Self-pay | Admitting: Surgery

## 2017-09-30 NOTE — Telephone Encounter (Signed)
I have called patient to go over information below. No answer. I have left a message on patient's voicemail to call back.    pre op date/time and sx date. Sx: 10/08/17 with Dr Maeola Sarah cholecystectomy.  Pre op: 10/01/17 between 1-5:00pm--phone interview.   Make patient aware to call [phone], between 1-3:00pm the day before surgery, to find out what time to arrive.

## 2017-10-01 ENCOUNTER — Encounter
Admission: RE | Admit: 2017-10-01 | Discharge: 2017-10-01 | Disposition: A | Discharge status: Home / Self Care | Payer: BC Managed Care – PPO | Source: Ambulatory Visit | Attending: Surgery | Admitting: Surgery

## 2017-10-01 ENCOUNTER — Other Ambulatory Visit: Payer: Self-pay

## 2017-10-01 HISTORY — DX: Gastro-esophageal reflux disease without esophagitis: K21.9

## 2017-10-01 HISTORY — DX: Hypothyroidism, unspecified: E03.9

## 2017-10-01 HISTORY — DX: Personal history of urinary calculi: Z87.442

## 2017-10-01 HISTORY — DX: Elevated blood-pressure reading, without diagnosis of hypertension: R03.0

## 2017-10-01 NOTE — Patient Instructions (Signed)
  Your procedure is scheduled on: 10-08-17 Report to Same Day Surgery 2nd floor medical mall [facility] Entrance-take elevator on left to 2nd floor.  Check in with surgery information desk.) To find out your arrival time please call [phone] between 1PM - 3PM on 10-07-17  Remember: Instructions that are not followed completely may result in serious medical risk, up to and including death, or upon the discretion of your surgeon and anesthesiologist your surgery may need to be rescheduled.    _x___ 1. Do not eat food after midnight the night before your procedure. NO GUM OR CANDY AFTER MIDNIGHT.  You may drink clear liquids up to 2 hours before you are scheduled to arrive at the hospital for your procedure.  Do not drink clear liquids within 2 hours of your scheduled arrival to the hospital.  Clear liquids include  --Water or Apple juice without pulp  --Clear carbohydrate beverage such as ClearFast or Gatorade  --Black Coffee or Clear Tea (No milk, no creamers, do not add anything to the coffee or Tea      __x__ 2. No Alcohol for 24 hours before or after surgery.   __x__3. No Smoking for 24 prior to surgery.   ____  4. Bring all medications with you on the day of surgery if instructed.    __x__ 5. Notify your doctor if there is any change in your medical condition     (cold, fever, infections).     Do not wear jewelry, make-up, hairpins, clips or nail polish.  Do not wear lotions, powders, or perfumes. You may wear deodorant.  Do not shave 48 hours prior to surgery. Men may shave face and neck.  Do not bring valuables to the hospital.    [facility] is not responsible for any belongings or valuables.               Contacts, dentures or bridgework may not be worn into surgery.  Leave your suitcase in the car. After surgery it may be brought to your room.  For patients admitted to the hospital, discharge time is determined by your treatment team.   Patients discharged the day of  surgery will not be allowed to drive home.  You will need someone to drive you home and stay with you the night of your procedure.    Please read over the following fact sheets that you were given:   [facility] Preparing for Surgery and or MRSA Information   _x___ TAKE THE FOLLOWING MEDICATION THE MORNING OF SURGERY WITH A SMALL SIP OF WATER. These include:  1. LEVOTHYROXINE  2.  3.  4.  5.  6.  ____Fleets enema or Magnesium Citrate as directed.   ____ Use CHG Soap or sage wipes as directed on instruction sheet   ____ Use inhalers on the day of surgery and bring to hospital day of surgery  ____ Stop Metformin and Janumet 2 days prior to surgery.    ____ Take 1/2 of usual insulin dose the night before surgery and none on the morning surgery.   ____ Follow recommendations from Cardiologist, Pulmonologist or PCP regarding stopping Aspirin, Coumadin, Plavix ,Eliquis, Effient, or Pradaxa, and Pletal.  X____Stop Anti-inflammatories such as Advil, Aleve, IBUPROFEN, Motrin, Naproxen, Naprosyn, Goodies powders or aspirin products NOW- OK to take Tylenol   _x___ Stop supplements until after surgery-STOP TURMERIC AND OREGANO NOW-MAY RESUME AFTER SURGERY   ____ Bring C-Pap to the hospital.

## 2017-10-01 NOTE — Telephone Encounter (Signed)
Patient has called back and all surgery information has been given. Patient understands.

## 2017-10-07 MED ORDER — CIPROFLOXACIN IN D5W 400 MG/200ML IV SOLN
400.0000 mg | INTRAVENOUS | Status: AC
  Administered 2017-10-08: 400 mg via INTRAVENOUS

## 2017-10-08 ENCOUNTER — Observation Stay
Admission: RE | Admit: 2017-10-08 | Discharge: 2017-10-09 | Disposition: A | Discharge status: Home / Self Care | Payer: BC Managed Care – PPO | Source: Ambulatory Visit | Attending: Surgery | Admitting: Surgery

## 2017-10-08 ENCOUNTER — Encounter
Admission: RE | Disposition: A | Discharge status: Home / Self Care | Payer: Self-pay | Source: Ambulatory Visit | Attending: Surgery

## 2017-10-08 ENCOUNTER — Other Ambulatory Visit: Payer: Self-pay

## 2017-10-08 ENCOUNTER — Ambulatory Visit: Payer: BC Managed Care – PPO | Admitting: Anesthesiology

## 2017-10-08 ENCOUNTER — Encounter: Payer: Self-pay | Admitting: *Deleted

## 2017-10-08 DIAGNOSIS — K219 Gastro-esophageal reflux disease without esophagitis: Secondary | ICD-10-CM | POA: Insufficient documentation

## 2017-10-08 DIAGNOSIS — Z79899 Other long term (current) drug therapy: Secondary | ICD-10-CM | POA: Insufficient documentation

## 2017-10-08 DIAGNOSIS — E785 Hyperlipidemia, unspecified: Secondary | ICD-10-CM | POA: Diagnosis not present

## 2017-10-08 DIAGNOSIS — E079 Disorder of thyroid, unspecified: Secondary | ICD-10-CM | POA: Diagnosis not present

## 2017-10-08 DIAGNOSIS — I739 Peripheral vascular disease, unspecified: Secondary | ICD-10-CM | POA: Diagnosis not present

## 2017-10-08 DIAGNOSIS — F329 Major depressive disorder, single episode, unspecified: Secondary | ICD-10-CM | POA: Diagnosis not present

## 2017-10-08 DIAGNOSIS — K810 Acute cholecystitis: Secondary | ICD-10-CM | POA: Diagnosis not present

## 2017-10-08 DIAGNOSIS — F419 Anxiety disorder, unspecified: Secondary | ICD-10-CM | POA: Diagnosis not present

## 2017-10-08 DIAGNOSIS — K8064 Calculus of gallbladder and bile duct with chronic cholecystitis without obstruction: Secondary | ICD-10-CM | POA: Diagnosis not present

## 2017-10-08 DIAGNOSIS — I10 Essential (primary) hypertension: Secondary | ICD-10-CM | POA: Insufficient documentation

## 2017-10-08 HISTORY — PX: CHOLECYSTECTOMY: SHX55

## 2017-10-08 LAB — CBC
HCT: 39.4 % (ref 35.0–47.0)
Hemoglobin: 13.6 g/dL (ref 12.0–16.0)
MCH: 30.6 pg (ref 26.0–34.0)
MCHC: 34.4 g/dL (ref 32.0–36.0)
MCV: 88.9 fL (ref 80.0–100.0)
PLATELETS: 218 10*3/uL (ref 150–440)
RBC: 4.44 MIL/uL (ref 3.80–5.20)
RDW: 13.1 % (ref 11.5–14.5)
WBC: 7.8 10*3/uL (ref 3.6–11.0)

## 2017-10-08 LAB — CREATININE, SERUM
CREATININE: 0.71 mg/dL (ref 0.44–1.00)
GFR calc Af Amer: >60 mL/min (ref >60)
GFR calc non Af Amer: >60 mL/min (ref >60)

## 2017-10-08 SURGERY — LAPAROSCOPIC CHOLECYSTECTOMY
Anesthesia: General | Wound class: Clean Contaminated

## 2017-10-08 MED ORDER — SUGAMMADEX SODIUM 200 MG/2ML IV SOLN
INTRAVENOUS | Status: DC | PRN
  Administered 2017-10-08: 200 mg via INTRAVENOUS

## 2017-10-08 MED ORDER — OXYCODONE-ACETAMINOPHEN 5-325 MG PO TABS
1.0000 | ORAL_TABLET | Freq: Four times a day (QID) | ORAL | Status: DC | PRN
  Administered 2017-10-08 (×2): 1 via ORAL
  Filled 2017-10-08 (×2): qty 1

## 2017-10-08 MED ORDER — HEPARIN SODIUM (PORCINE) 5000 UNIT/ML IJ SOLN
5000.0000 [IU] | Freq: Three times a day (TID) | INTRAMUSCULAR | Status: DC
  Administered 2017-10-08 – 2017-10-09 (×2): 5000 [IU] via SUBCUTANEOUS
  Filled 2017-10-08 (×2): qty 1

## 2017-10-08 MED ORDER — PROPOFOL 10 MG/ML IV BOLUS
INTRAVENOUS | Status: AC
  Filled 2017-10-08: qty 20

## 2017-10-08 MED ORDER — OXYCODONE HCL 5 MG/5ML PO SOLN: 5.0000 mg | Freq: Once | ORAL | Status: DC | PRN

## 2017-10-08 MED ORDER — HEPARIN SODIUM (PORCINE) 5000 UNIT/ML IJ SOLN
INTRAMUSCULAR | Status: AC
  Administered 2017-10-08: 5000 [IU]
  Filled 2017-10-08: qty 1

## 2017-10-08 MED ORDER — PROPOFOL 10 MG/ML IV BOLUS
INTRAVENOUS | Status: DC | PRN
  Administered 2017-10-08: 200 mg via INTRAVENOUS

## 2017-10-08 MED ORDER — DEXAMETHASONE SODIUM PHOSPHATE 10 MG/ML IJ SOLN
INTRAMUSCULAR | Status: DC | PRN
  Administered 2017-10-08: 5 mg via INTRAVENOUS

## 2017-10-08 MED ORDER — FAMOTIDINE 20 MG PO TABS: 20.0000 mg | ORAL_TABLET | Freq: Once | ORAL | Status: DC

## 2017-10-08 MED ORDER — ONDANSETRON HCL 4 MG/2ML IJ SOLN
INTRAMUSCULAR | Status: DC | PRN
  Administered 2017-10-08: 4 mg via INTRAVENOUS

## 2017-10-08 MED ORDER — FENTANYL CITRATE (PF) 100 MCG/2ML IJ SOLN
25.0000 ug | INTRAMUSCULAR | Status: DC | PRN
  Administered 2017-10-08 (×2): 25 ug via INTRAVENOUS
  Administered 2017-10-08 (×2): 50 ug via INTRAVENOUS

## 2017-10-08 MED ORDER — ROCURONIUM BROMIDE 100 MG/10ML IV SOLN
INTRAVENOUS | Status: DC | PRN
  Administered 2017-10-08: 40 mg via INTRAVENOUS

## 2017-10-08 MED ORDER — OXYCODONE HCL 5 MG PO TABS: 5.0000 mg | ORAL_TABLET | Freq: Once | ORAL | Status: DC | PRN

## 2017-10-08 MED ORDER — CHLORHEXIDINE GLUCONATE CLOTH 2 % EX PADS: 6.0000 | MEDICATED_PAD | Freq: Once | CUTANEOUS | Status: DC

## 2017-10-08 MED ORDER — FAMOTIDINE 20 MG PO TABS
ORAL_TABLET | ORAL | Status: AC
  Administered 2017-10-08: 20 mg
  Filled 2017-10-08: qty 1

## 2017-10-08 MED ORDER — HEPARIN SODIUM (PORCINE) 5000 UNIT/ML IJ SOLN: 5000.0000 [IU] | Freq: Once | INTRAMUSCULAR | Status: DC

## 2017-10-08 MED ORDER — LACTATED RINGERS IV SOLN
INTRAVENOUS | Status: DC
  Administered 2017-10-08 – 2017-10-09 (×3): via INTRAVENOUS

## 2017-10-08 MED ORDER — EPHEDRINE SULFATE 50 MG/ML IJ SOLN
INTRAMUSCULAR | Status: DC | PRN
  Administered 2017-10-08: 10 mg via INTRAVENOUS

## 2017-10-08 MED ORDER — DULOXETINE HCL 20 MG PO CPEP
40.0000 mg | ORAL_CAPSULE | Freq: Every day | ORAL | Status: DC
  Administered 2017-10-08: 40 mg via ORAL
  Filled 2017-10-08: qty 2

## 2017-10-08 MED ORDER — FENTANYL CITRATE (PF) 100 MCG/2ML IJ SOLN
INTRAMUSCULAR | Status: AC
  Administered 2017-10-08: 25 ug via INTRAVENOUS
  Filled 2017-10-08: qty 2

## 2017-10-08 MED ORDER — MORPHINE SULFATE (PF) 4 MG/ML IV SOLN: 2.0000 mg | INTRAVENOUS | Status: DC | PRN

## 2017-10-08 MED ORDER — BUPIVACAINE-EPINEPHRINE (PF) 0.25% -1:200000 IJ SOLN
INTRAMUSCULAR | Status: DC | PRN
  Administered 2017-10-08: 30 mL via PERINEURAL

## 2017-10-08 MED ORDER — LIDOCAINE HCL (CARDIAC) 20 MG/ML IV SOLN
INTRAVENOUS | Status: DC | PRN
  Administered 2017-10-08: 50 mg via INTRAVENOUS

## 2017-10-08 MED ORDER — CIPROFLOXACIN IN D5W 400 MG/200ML IV SOLN
INTRAVENOUS | Status: AC
  Filled 2017-10-08: qty 200

## 2017-10-08 MED ORDER — ONDANSETRON HCL 4 MG/2ML IJ SOLN
4.0000 mg | Freq: Four times a day (QID) | INTRAMUSCULAR | Status: DC | PRN
Start: 2017-10-08 — End: 2017-10-09

## 2017-10-08 MED ORDER — FENTANYL CITRATE (PF) 100 MCG/2ML IJ SOLN
INTRAMUSCULAR | Status: AC
  Administered 2017-10-08: 50 ug via INTRAVENOUS
  Filled 2017-10-08: qty 2

## 2017-10-08 MED ORDER — BUPIVACAINE-EPINEPHRINE (PF) 0.25% -1:200000 IJ SOLN
INTRAMUSCULAR | Status: AC
  Filled 2017-10-08: qty 30

## 2017-10-08 MED ORDER — LACTATED RINGERS IV SOLN
INTRAVENOUS | Status: DC | PRN
  Administered 2017-10-08: 13:00:00 via INTRAVENOUS

## 2017-10-08 MED ORDER — LACTATED RINGERS IV SOLN: INTRAVENOUS | Status: DC

## 2017-10-08 MED ORDER — LEVOTHYROXINE SODIUM 112 MCG PO TABS
112.0000 ug | ORAL_TABLET | Freq: Every day | ORAL | Status: DC
  Administered 2017-10-09: 112 ug via ORAL
  Filled 2017-10-08: qty 1

## 2017-10-08 MED ORDER — FENTANYL CITRATE (PF) 100 MCG/2ML IJ SOLN
INTRAMUSCULAR | Status: DC | PRN
  Administered 2017-10-08 (×3): 50 ug via INTRAVENOUS
  Administered 2017-10-08: 100 ug via INTRAVENOUS

## 2017-10-08 MED ORDER — ONDANSETRON HCL 4 MG PO TABS: 4.0000 mg | ORAL_TABLET | Freq: Four times a day (QID) | ORAL | Status: DC | PRN

## 2017-10-08 SURGICAL SUPPLY — 46 items
ADH LQ OCL WTPRF AMP STRL LF (MISCELLANEOUS) ×1
ADHESIVE MASTISOL STRL (MISCELLANEOUS) ×3 IMPLANT
APPLIER CLIP ROT 10 11.4 M/L (STAPLE) ×3
APR CLP MED LRG 11.4X10 (STAPLE) ×1
BAG SPEC RTRVL LRG 6X4 10 (ENDOMECHANICALS) ×1
BLADE SURG SZ11 CARB STEEL (BLADE) ×3 IMPLANT
CANISTER SUCT 1200ML W/VALVE (MISCELLANEOUS) ×3 IMPLANT
CATH CHOLANGI 4FR 420404F (CATHETERS) IMPLANT
CHLORAPREP W/TINT 26ML (MISCELLANEOUS) ×3 IMPLANT
CLIP APPLIE ROT 10 11.4 M/L (STAPLE) ×1 IMPLANT
CLOSURE WOUND 1/2 X4 (GAUZE/BANDAGES/DRESSINGS) ×1
CONRAY 60ML FOR OR (MISCELLANEOUS) IMPLANT
DRAPE C-ARM XRAY 36X54 (DRAPES) IMPLANT
ELECT REM PT RETURN 9FT ADLT (ELECTROSURGICAL) ×3
ELECTRODE REM PT RTRN 9FT ADLT (ELECTROSURGICAL) ×1 IMPLANT
GAUZE SPONGE NON-WVN 2X2 STRL (MISCELLANEOUS) ×4 IMPLANT
GLOVE BIO SURGEON STRL SZ8 (GLOVE) ×3 IMPLANT
GOWN STRL REUS W/ TWL LRG LVL3 (GOWN DISPOSABLE) ×4 IMPLANT
GOWN STRL REUS W/TWL LRG LVL3 (GOWN DISPOSABLE) ×8
IRRIGATION STRYKERFLOW (MISCELLANEOUS) ×1 IMPLANT
IRRIGATOR STRYKERFLOW (MISCELLANEOUS) ×3
IV CATH ANGIO 12GX3 LT BLUE (NEEDLE) ×3 IMPLANT
IV NS 1000ML (IV SOLUTION) ×2
IV NS 1000ML BAXH (IV SOLUTION) ×1 IMPLANT
JACKSON PRATT 10 (INSTRUMENTS) IMPLANT
KIT RM TURNOVER STRD PROC AR (KITS) ×3 IMPLANT
LABEL OR SOLS (LABEL) ×3 IMPLANT
NEEDLE HYPO 22GX1.5 SAFETY (NEEDLE) ×3 IMPLANT
NEEDLE VERESS 14GA 120MM (NEEDLE) ×3 IMPLANT
NS IRRIG 500ML POUR BTL (IV SOLUTION) ×3 IMPLANT
PACK LAP CHOLECYSTECTOMY (MISCELLANEOUS) ×3 IMPLANT
POUCH SPECIMEN RETRIEVAL 10MM (ENDOMECHANICALS) ×3 IMPLANT
SCISSORS METZENBAUM CVD 33 (INSTRUMENTS) ×3 IMPLANT
SLEEVE ENDOPATH XCEL 5M (ENDOMECHANICALS) ×6 IMPLANT
SPONGE LAP 18X18 5 PK (GAUZE/BANDAGES/DRESSINGS) ×3 IMPLANT
SPONGE VERSALON 2X2 STRL (MISCELLANEOUS) ×12
SPONGE VERSALON 4X4 4PLY (MISCELLANEOUS) IMPLANT
STRIP CLOSURE SKIN 1/2X4 (GAUZE/BANDAGES/DRESSINGS) ×2 IMPLANT
SUT MNCRL 4-0 (SUTURE) ×3
SUT MNCRL 4-0 27XMFL (SUTURE) ×1
SUT VICRYL 0 AB UR-6 (SUTURE) ×3 IMPLANT
SUTURE MNCRL 4-0 27XMF (SUTURE) ×1 IMPLANT
SYR 20CC LL (SYRINGE) ×3 IMPLANT
TROCAR XCEL NON-BLD 11X100MML (ENDOMECHANICALS) ×3 IMPLANT
TROCAR XCEL NON-BLD 5MMX100MML (ENDOMECHANICALS) ×3 IMPLANT
TUBING INSUFFLATION (TUBING) ×3 IMPLANT

## 2017-10-08 NOTE — Anesthesia Preprocedure Evaluation (Signed)
Anesthesia Evaluation  Patient identified by MRN, date of birth, ID band Patient awake    Reviewed: Allergy & Precautions, H&P , NPO status , Patient's Chart, lab work & pertinent test results  History of Anesthesia Complications Negative for: history of anesthetic complications  Airway Mallampati: III  TM Distance: <3 FB Neck ROM: full    Dental  (+) Chipped   Pulmonary neg pulmonary ROS, neg shortness of breath,           Cardiovascular Exercise Tolerance: Good hypertension, (-) angina+ Peripheral Vascular Disease  (-) Past MI and (-) DOE      Neuro/Psych  Headaches, PSYCHIATRIC DISORDERS Anxiety Depression    GI/Hepatic Neg liver ROS, GERD  Medicated and Controlled,  Endo/Other  Hypothyroidism   Renal/GU      Musculoskeletal  (+) Arthritis ,   Abdominal   Peds  Hematology negative hematology ROS (+)   Anesthesia Other Findings Past Medical History: No date: Anxiety No date: Arthritis 09/19/2017: Cholecystitis with cholelithiasis No date: Depression No date: Elevated blood pressure reading     Comment:  no meds No date: GERD (gastroesophageal reflux disease)     Comment:  occ No date: Heartburn No date: History of kidney stones     Comment:  h/o No date: Hyperlipidemia No date: Hypothyroidism 09/23/2017: Migraine No date: Migraines     Comment:  without aura No date: Obesity No date: Thyroid disease 09/06/2017: Varicose veins of both lower extremities  Past Surgical History: No date: Removal of extra tooth     Comment:  age 47     Reproductive/Obstetrics negative OB ROS                             Anesthesia Physical Anesthesia Plan  ASA: III  Anesthesia Plan: General ETT   Post-op Pain Management:    Induction: Intravenous  PONV Risk Score and Plan:   Airway Management Planned: Oral ETT  Additional Equipment:   Intra-op Plan:   Post-operative Plan:  Extubation in OR  Informed Consent: I have reviewed the patients History and Physical, chart, labs and discussed the procedure including the risks, benefits and alternatives for the proposed anesthesia with the patient or authorized representative who has indicated his/her understanding and acceptance.   Dental Advisory Given  Plan Discussed with: Anesthesiologist, CRNA and Surgeon  Anesthesia Plan Comments: (Patient consented for risks of anesthesia including but not limited to:  - adverse reactions to medications - damage to teeth, lips or other oral mucosa - sore throat or hoarseness - Damage to heart, brain, lungs or loss of life  Patient voiced understanding.)        Anesthesia Quick Evaluation

## 2017-10-08 NOTE — Anesthesia Post-op Follow-up Note (Signed)
Anesthesia QCDR form completed.        

## 2017-10-08 NOTE — Op Note (Signed)
Laparoscopic Cholecystectomy  Pre-operative Diagnosis: Biliary colic  Post-operative Diagnosis: Acute edematous Chole cystitis  Procedure: Laparoscopic cholecystectomy  Surgeon: Jerrol Banana. Burt Knack, MD FACS  Anesthesia: Gen. with endotracheal tube  Assistant:PA student  Procedure Details  The patient was seen again in the Holding Room. The benefits, complications, treatment options, and expected outcomes were discussed with the patient. The risks of bleeding, infection, recurrence of symptoms, failure to resolve symptoms, bile duct damage, bile duct leak, retained common bile duct stone, bowel injury, any of which could require further surgery and/or ERCP, stent, or papillotomy were reviewed with the patient. The likelihood of improving the patient's symptoms with return to their baseline status is good.  The patient and/or family concurred with the proposed plan, giving informed consent.  The patient was taken to Operating Room, identified as Nicole Osborn and the procedure verified as Laparoscopic Cholecystectomy.  A Time Out was held and the above information confirmed.  Prior to the induction of general anesthesia, antibiotic prophylaxis was administered. VTE prophylaxis was in place. General endotracheal anesthesia was then administered and tolerated well. After the induction, the abdomen was prepped with Chloraprep and draped in the sterile fashion. The patient was positioned in the supine position.  Local anesthetic  was injected into the skin near the umbilicus and an incision made. The Veress needle was placed. Pneumoperitoneum was then created with CO2 and tolerated well without any adverse changes in the patient's vital signs. A 42mm port was placed in the periumbilical position and the abdominal cavity was explored.  Two 5-mm ports were placed in the right upper quadrant and a 12 mm epigastric port was placed all under direct vision. All skin incisions  were infiltrated with a local  anesthetic agent before making the incision and placing the trocars.   The patient was positioned  in reverse Trendelenburg, tilted slightly to the patient's left.  The gallbladder was identified, the fundus grasped and retracted cephalad. Adhesions were lysed bluntly. The infundibulum was grasped and retracted laterally, exposing the peritoneum overlying the triangle of Calot. This was then divided and exposed in a blunt fashion. A critical view of the cystic duct and cystic artery was obtained.  The cystic duct was clearly identified and bluntly dissected.   There were multiple branches of small cystic venous structures and arterial structures which were clipped or doubly clipped and divided.  This allowed for good visualization of a very edematous cystic duct extending into the infundibulum of the gallbladder.  Once critical view was obtained the cystic duct was doubly clipped and divided.  The cystic artery had been doubly clipped and divided in multiple small branches.  The gallbladder was taken from the gallbladder fossa in a retrograde fashion with the electrocautery. The gallbladder was removed and placed in an Endocatch bag. The liver bed was irrigated and inspected. Hemostasis was achieved with the electrocautery. Copious irrigation was utilized and was repeatedly aspirated until clear.  The gallbladder and Endocatch sac were then removed through the epigastric port site.   Inspection of the right upper quadrant was performed. No bleeding, bile duct injury or leak, or bowel injury was noted. Pneumoperitoneum was released.  The epigastric port site was closed with figure-of-eight 0 Vicryl sutures. 4-0 subcuticular Monocryl was used to close the skin. Steristrips and Mastisol and sterile dressings were  applied.  The patient was then extubated and brought to the recovery room in stable condition. Sponge, lap, and needle counts were correct at closure and at the conclusion  of the case.    Findings: Acute edematous cholecystitis , hyperemic and acutely inflamed  Estimated Blood Loss: 50 cc         Drains: None         Specimens: Gallbladder           Complications: none               Nicole Osborn E. Burt Knack, MD, FACS

## 2017-10-08 NOTE — Progress Notes (Signed)
Preoperative Review   Patient is met in the preoperative holding area. The history is reviewed in the chart and with the patient. I personally reviewed the options and rationale as well as the risks of this procedure that have been previously discussed with the patient. All questions asked by the patient and/or family were answered to their satisfaction.  Patient agrees to proceed with this procedure at this time.  Nandi Tonnesen E Delaila Nand M.D. FACS  

## 2017-10-08 NOTE — Anesthesia Procedure Notes (Signed)
Procedure Name: Intubation Date/Time: 10/08/2017 1:14 PM Performed by: Philbert Riser, CRNA Pre-anesthesia Checklist: Patient identified Patient Re-evaluated:Patient Re-evaluated prior to induction Oxygen Delivery Method: Circle system utilized and Simple face mask Preoxygenation: Pre-oxygenation with 100% oxygen Induction Type: IV induction Ventilation: Mask ventilation without difficulty Laryngoscope Size: Mac and 3 Grade View: Grade I Tube type: Oral Tube size: 7.0 mm Number of attempts: 1

## 2017-10-08 NOTE — Transfer of Care (Signed)
Immediate Anesthesia Transfer of Care Note  Patient: Nicole Osborn  Procedure(s) Performed: LAPAROSCOPIC CHOLECYSTECTOMY (N/A )  Patient Location: PACU  Anesthesia Type:General  Level of Consciousness: sedated  Airway & Oxygen Therapy: Patient Spontanous Breathing and Patient connected to face mask oxygen  Post-op Assessment: Report given to RN and Post -op Vital signs reviewed and stable  Post vital signs: Reviewed and stable  Last Vitals:  Vitals:   10/08/17 1108 10/08/17 1412  BP: 131/64 (!) 155/86  Pulse: 87 85  Resp: 16 18  Temp: (!) 35.6 C (!) 36.3 C  SpO2: 99% 96%    Last Pain:  Vitals:   10/08/17 1412  TempSrc:   PainSc: Asleep         Complications: No apparent anesthesia complications

## 2017-10-09 ENCOUNTER — Encounter: Payer: Self-pay | Admitting: Surgery

## 2017-10-09 DIAGNOSIS — K8064 Calculus of gallbladder and bile duct with chronic cholecystitis without obstruction: Secondary | ICD-10-CM | POA: Diagnosis not present

## 2017-10-09 LAB — CBC
HEMATOCRIT: 38.3 % (ref 35.0–47.0)
Hemoglobin: 13.2 g/dL (ref 12.0–16.0)
MCH: 30.6 pg (ref 26.0–34.0)
MCHC: 34.5 g/dL (ref 32.0–36.0)
MCV: 88.6 fL (ref 80.0–100.0)
Platelets: 233 10*3/uL (ref 150–440)
RBC: 4.32 MIL/uL (ref 3.80–5.20)
RDW: 13.1 % (ref 11.5–14.5)
WBC: 7.9 10*3/uL (ref 3.6–11.0)

## 2017-10-09 LAB — COMPREHENSIVE METABOLIC PANEL
ALBUMIN: 3.9 g/dL (ref 3.5–5.0)
ALK PHOS: 76 U/L (ref 38–126)
ALT: 26 U/L (ref 14–54)
AST: 34 U/L (ref 15–41)
Anion gap: 9 (ref 5–15)
BILIRUBIN TOTAL: 0.7 mg/dL (ref 0.3–1.2)
BUN: 10 mg/dL (ref 6–20)
CO2: 24 mmol/L (ref 22–32)
Calcium: 9.3 mg/dL (ref 8.9–10.3)
Chloride: 106 mmol/L (ref 101–111)
Creatinine, Ser: 0.65 mg/dL (ref 0.44–1.00)
GFR calc Af Amer: >60 mL/min (ref >60)
GFR calc non Af Amer: >60 mL/min (ref >60)
GLUCOSE: 136 mg/dL — AB (ref 65–99)
Potassium: 4.1 mmol/L (ref 3.5–5.1)
Sodium: 139 mmol/L (ref 135–145)
TOTAL PROTEIN: 7 g/dL (ref 6.5–8.1)

## 2017-10-09 LAB — HIV ANTIBODY (ROUTINE TESTING W REFLEX): HIV Screen 4th Generation wRfx: NONREACTIVE

## 2017-10-09 MED ORDER — OXYCODONE-ACETAMINOPHEN 5-325 MG PO TABS: 1.0000 | ORAL_TABLET | Freq: Four times a day (QID) | ORAL | 0 refills | Status: DC | PRN

## 2017-10-09 NOTE — Progress Notes (Signed)
Patient tolerating diet well. Pain well managed with oral meds. No drainage from incisions. Cleared for discharge by DR Burt Knack. AVS printed. Instructions and prescription given. All patient questions/concerns answered. IV removed. Belongings gathered. Transported to exit by NT.

## 2017-10-09 NOTE — Progress Notes (Signed)
1 Day Post-Op  Subjective: Patient is status post laparoscopic cholecystectomy for what was believed to be an elective case for biliary colic but on laparoscopy she was found to have acute cholecystitis.  She was admitted overnight for observation due to the considerable inflammation and hemorrhage at the time of surgery.  Today she feels well is tolerating a diet feels ready for discharge.  Objective: Vital signs in last 24 hours: Temp:  [96.1 F (35.6 C)-98.6 F (37 C)] 98 F (36.7 C) (01/16 0850) Pulse Rate:  [51-87] 70 (01/16 0850) Resp:  [13-22] 16 (01/16 0850) BP: (131-161)/(64-86) 131/84 (01/16 0850) SpO2:  [95 %-100 %] 96 % (01/16 0850) Weight:  [225 lb 9.6 oz (102.3 kg)] 225 lb 9.6 oz (102.3 kg) (01/15 1710) Last BM Date: 10/08/17  Intake/Output from previous day: 01/15 0701 - 01/16 0700 In: 1920 [P.O.:120; I.V.:1800] Out: 2530 [Urine:2525; Blood:5] Intake/Output this shift: No intake/output data recorded.  Physical exam:  Vital signs are stable and reviewed.  Patient awake alert oriented.  Abdomen is soft nondistended nontympanitic and nontender no ecchymosis.  Wounds are clean with dressings in place and no drainage.  Calves are nontender  Lab Results: CBC  Recent Labs    10/08/17 1626 10/09/17 0407  WBC 7.8 7.9  HGB 13.6 13.2  HCT 39.4 38.3  PLT 218 233   BMET Recent Labs    10/08/17 1626 10/09/17 0407  NA  --  139  K  --  4.1  CL  --  106  CO2  --  24  GLUCOSE  --  136*  BUN  --  10  CREATININE 0.71 0.65  CALCIUM  --  9.3   PT/INR No results for input(s): LABPROT, INR in the last 72 hours. ABG No results for input(s): PHART, HCO3 in the last 72 hours.  Invalid input(s): PCO2, PO2  Studies/Results: No results found.  Anti-infectives: Anti-infectives (From admission, onward)   Start     Dose/Rate Route Frequency Ordered Stop   10/08/17 1057  ciprofloxacin (CIPRO) 400 MG/200ML IVPB    Comments:  Norton Blizzard  : cabinet override   10/08/17 1057 10/08/17 2314   10/08/17 0600  ciprofloxacin (CIPRO) IVPB 400 mg     400 mg 200 mL/hr over 60 Minutes Intravenous On call to O.R. 10/07/17 2246 10/08/17 1317      Assessment/Plan: s/p Procedure(s): LAPAROSCOPIC CHOLECYSTECTOMY   Hemoglobin did not drop and liver function tests are normal.  Patient to be discharged today to follow-up in my office next week.  She is given instructions on showering and removal of dressing either later today or tomorrow.  She will be given oral analgesics for pain.  Florene Glen, MD, FACS  10/09/2017

## 2017-10-09 NOTE — Discharge Instructions (Signed)
Remove dressing in 24 hours. °May shower in 24 hours. °Leave paper strips in place. °Resume all home medications. °Follow-up with Dr. Romir Klimowicz in 10 days. °

## 2017-10-10 LAB — SURGICAL PATHOLOGY

## 2017-10-10 NOTE — Anesthesia Postprocedure Evaluation (Signed)
Anesthesia Post Note  Patient: AZALIAH CARRERO  Procedure(s) Performed: LAPAROSCOPIC CHOLECYSTECTOMY (N/A )  Patient location during evaluation: PACU Anesthesia Type: General Level of consciousness: awake and alert Pain management: pain level controlled Vital Signs Assessment: post-procedure vital signs reviewed and stable Respiratory status: spontaneous breathing, nonlabored ventilation, respiratory function stable and patient connected to nasal cannula oxygen Cardiovascular status: blood pressure returned to baseline and stable Postop Assessment: no apparent nausea or vomiting Anesthetic complications: no     Last Vitals:  Vitals:   10/09/17 0530 10/09/17 0850  BP: (!) 146/73 131/84  Pulse: 71 70  Resp: 20 16  Temp: 36.7 C 36.7 C  SpO2: 95% 96%    Last Pain:  Vitals:   10/09/17 0850  TempSrc: Oral  PainSc:                  Molli Barrows

## 2017-10-11 ENCOUNTER — Other Ambulatory Visit: Payer: Self-pay | Admitting: Family Medicine

## 2017-10-18 ENCOUNTER — Ambulatory Visit (INDEPENDENT_AMBULATORY_CARE_PROVIDER_SITE_OTHER): Payer: BC Managed Care – PPO | Admitting: Surgery

## 2017-10-18 ENCOUNTER — Encounter: Payer: Self-pay | Admitting: Surgery

## 2017-10-18 VITALS — BP 132/83 | HR 84 | Temp 97.9°F | Ht 66.0 in | Wt 217.6 lb

## 2017-10-18 DIAGNOSIS — K8050 Calculus of bile duct without cholangitis or cholecystitis without obstruction: Secondary | ICD-10-CM

## 2017-10-18 NOTE — Progress Notes (Signed)
Outpatient postop visit  10/18/2017  Nicole Osborn is an 47 y.o. female.    Procedure: Laparoscopic cholecystectomy  CC:no problems  HPI: Patient status post laparoscopic cholecystectomy. She feels well at this time and is tolerating a regular diet no fevers or chills no jaundice or acholic stools some loose stools on occasion with urgency.  He does have an area where she received a heparin injection subcu that is swollen. Medications reviewed.    Physical Exam:  LMP 06/12/2016 (Exact Date)     PE: No icterus no jaundice abdomen is soft nontender right sided ecchymosis from heparin injection well away from any of the surgical sites.  Surgical sites are all healed well with no erythema or drainage    Assessment/Plan:  Pathology is reviewed confirming diagnosis with no sign of malignancy.  Discussed that ecchymosis will resolve.  No other problems patient to follow-up on an as-needed basis.  Florene Glen, MD, FACS

## 2017-10-18 NOTE — Patient Instructions (Signed)

## 2017-11-10 ENCOUNTER — Other Ambulatory Visit: Payer: Self-pay | Admitting: Family Medicine

## 2018-01-01 ENCOUNTER — Encounter (INDEPENDENT_AMBULATORY_CARE_PROVIDER_SITE_OTHER): Payer: Self-pay | Admitting: Vascular Surgery

## 2018-01-01 ENCOUNTER — Ambulatory Visit (INDEPENDENT_AMBULATORY_CARE_PROVIDER_SITE_OTHER): Payer: BC Managed Care – PPO | Admitting: Vascular Surgery

## 2018-01-01 VITALS — BP 148/84 | HR 86 | Resp 17 | Ht 65.0 in | Wt 221.4 lb

## 2018-01-01 DIAGNOSIS — I8393 Asymptomatic varicose veins of bilateral lower extremities: Secondary | ICD-10-CM

## 2018-01-01 DIAGNOSIS — R600 Localized edema: Secondary | ICD-10-CM | POA: Insufficient documentation

## 2018-01-01 NOTE — Progress Notes (Signed)
Subjective:    Patient ID: Nicole Osborn, female    DOB: 1971-03-09, 47 y.o.   MRN: [id number] Chief Complaint  Patient presents with  . Varicose Veins    ref Lada   Presents as a new patient referred by Dr. Sanda Klein for evaluation of varicose veins.  The patient endorses a long-standing history of varicosities noted to her legs which have progressively gotten bigger and more painful.  The patient notes large varicosities located to the medial aspect of her bilateral feet.  The patient also notes tenderness which runs along the medial aspect of both her legs.  Her symptoms progressively worsened throughout the day.  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  At this time, the patient does not engage in any conservative therapy.  The patient denies any recent surgery/trauma to the bilateral lower extremity.  The patient denies any claudication-like symptoms, rest pain or ulceration to the bilateral lower extremity.  The patient does note some swelling primarily to the left ankle.  The patient does feel that her symptoms have progressed to the point that she is unable to function on a daily basis.  She feels that her symptoms have become lifestyle limiting.  The patient denies any fever, nausea or vomiting.  Review of Systems  Constitutional: Negative.   HENT: Negative.   Eyes: Negative.   Respiratory: Negative.   Cardiovascular: Positive for leg swelling.       Painful varicose veins  Gastrointestinal: Negative.   Endocrine: Negative.   Genitourinary: Negative.   Musculoskeletal: Negative.   Skin: Negative.   Allergic/Immunologic: Negative.   Neurological: Negative.   Hematological: Negative.   Psychiatric/Behavioral: Negative.       Objective:   Physical Exam  Constitutional: She is oriented to person, place, and time. She appears well-developed and well-nourished. No distress.  HENT:  Head: Normocephalic and atraumatic.  Eyes: Pupils are equal,  round, and reactive to light. Conjunctivae are normal.  Neck: Normal range of motion.  Cardiovascular: Normal rate, regular rhythm, normal heart sounds and intact distal pulses.  Pulmonary/Chest: Effort normal and breath sounds normal.  Musculoskeletal: Normal range of motion. She exhibits edema (Mild lower extremity edema noted.  There is some swelling noted to the left ankle.).  Neurological: She is alert and oriented to person, place, and time.  Skin: She is not diaphoretic.  There are less than 1 cm scattered varicosities noted to the bilateral lower extremity.  There is no stasis dermatitis, skin thickening, cellulitis or ulcerations noted to the bilateral lower extremity  Psychiatric: She has a normal mood and affect. Her behavior is normal. Judgment and thought content normal.  Vitals reviewed.  BP (!) 148/84 (BP Location: Right Arm)   Pulse 86   Resp 17   Ht 5\' 5"  (1.651 m)   Wt 221 lb 6.4 oz (100.4 kg)   LMP 06/12/2016 (Exact Date)   BMI 36.84 kg/m   Past Medical History:  Diagnosis Date  . Anxiety   . Arthritis   . Cholecystitis with cholelithiasis 09/19/2017  . Depression   . Elevated blood pressure reading    no meds  . GERD (gastroesophageal reflux disease)    occ  . Heartburn   . History of kidney stones    h/o  . Hyperlipidemia   . Hypothyroidism   . Migraine 09/23/2017  . Migraines    without aura  . Obesity   . Thyroid disease   . Varicose veins of  both lower extremities 09/06/2017   Social History   Socioeconomic History  . Marital status: Married    Spouse name: Not on file  . Number of children: Not on file  . Years of education: Not on file  . Highest education level: Not on file  Occupational History  . Not on file  Social Needs  . Financial resource strain: Not on file  . Food insecurity:    Worry: Not on file    Inability: Not on file  . Transportation needs:    Medical: Not on file    Non-medical: Not on file  Tobacco Use  .  Smoking status: Never Smoker  . Smokeless tobacco: Never Used  Substance and Sexual Activity  . Alcohol use: No  . Drug use: No  . Sexual activity: Yes    Birth control/protection: Surgical, Other-see comments    Comment: Husband with vastectomy   Lifestyle  . Physical activity:    Days per week: Not on file    Minutes per session: Not on file  . Stress: Not on file  Relationships  . Social connections:    Talks on phone: Not on file    Gets together: Not on file    Attends religious service: Not on file    Active member of club or organization: Not on file    Attends meetings of clubs or organizations: Not on file    Relationship status: Not on file  . Intimate partner violence:    Fear of current or ex partner: Not on file    Emotionally abused: Not on file    Physically abused: Not on file    Forced sexual activity: Not on file  Other Topics Concern  . Not on file  Social History Narrative  . Not on file   Past Surgical History:  Procedure Laterality Date  . CHOLECYSTECTOMY N/A 10/08/2017   Procedure: LAPAROSCOPIC CHOLECYSTECTOMY;  Surgeon: Florene Glen, MD;  Location: [facility] ORS;  Service: General;  Laterality: N/A;  . Removal of extra tooth     age 20   Family History  Problem Relation Age of Onset  . Depression Mother   . Arthritis Mother   . Stroke Father   . Hypertension Father   . Breast cancer Paternal Aunt 76  . Emphysema Maternal Grandmother   . Emphysema Maternal Grandfather   . Dementia Paternal Grandmother   . Asthma Son   . Eczema Son   . Kidney disease Neg Hx   . Bladder Cancer Neg Hx    Allergies  Allergen Reactions  . Cefaclor Swelling    Lips swelling       Assessment & Plan:  Presents as a new patient referred by Dr. Sanda Klein for evaluation of varicose veins.  The patient endorses a long-standing history of varicosities noted to her legs which have progressively gotten bigger and more painful.  The patient notes large varicosities located  to the medial aspect of her bilateral feet.  The patient also notes tenderness which runs along the medial aspect of both her legs.  Her symptoms progressively worsened throughout the day.  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  At this time, the patient does not engage in any conservative therapy.  The patient denies any recent surgery/trauma to the bilateral lower extremity.  The patient denies any claudication-like symptoms, rest pain or ulceration to the bilateral lower extremity.  The patient does note some swelling primarily to the  left ankle.  The patient does feel that her symptoms have progressed to the point that she is unable to function on a daily basis.  She feels that her symptoms have become lifestyle limiting.  The patient denies any fever, nausea or vomiting.  1. Varicose veins of both lower extremities, unspecified whether complicated - New The patient was encouraged to wear graduated compression stockings (20-30 mmHg) on a daily basis. The patient was instructed to begin wearing the stockings first thing in the morning and removing them in the evening. The patient was instructed specifically not to sleep in the stockings. Prescription given. In addition, behavioral modification including elevation during the day will be initiated. Anti-inflammatories for pain. I will bring the patient back at her convenience to undergo bilateral venous duplex exam to rule out any contributing venous disease Information on compression stockings was given to the patient. The patient was instructed to call the office in the interim if any worsening edema or ulcerations to the legs, feet or toes occurs. The patient expresses their understanding.  - VAS [place] LOWER EXTREMITY VENOUS REFLUX; Future  2. Bilateral lower extremity edema - New As above  - VAS [place] LOWER EXTREMITY VENOUS REFLUX; Future  Current Outpatient Medications on File Prior to Visit  Medication Sig  Dispense Refill  . calcium carbonate (TUMS - DOSED IN MG ELEMENTAL CALCIUM) 500 MG chewable tablet Chew 2-3 tablets by mouth 2 (two) times daily as needed for indigestion or heartburn.    . Cholecalciferol (VITAMIN D3) 2000 units capsule Take 2,000 Units by mouth daily.     . DULoxetine (CYMBALTA) 20 MG capsule TAKE 2 CAPSULES BY MOUTH EVERY DAY 60 capsule 6  . ibuprofen (ADVIL,MOTRIN) 200 MG tablet Take 400-600 mg by mouth every 6 (six) hours as needed for headache or mild pain (2-3 tablets depending on pain).    . Ibuprofen-Diphenhydramine Cit (IBUPROFEN PM PO) Take 1 tablet by mouth at bedtime as needed (sleep).     Marland Kitchen levothyroxine (SYNTHROID, LEVOTHROID) 112 MCG tablet Take 1 tablet (112 mcg total) by mouth daily before breakfast. 30 tablet 2  . Magnesium 250 MG TABS Take 250 mg by mouth at bedtime.     . Multiple Vitamin (MULTI-VITAMINS) TABS Take 1 tablet by mouth daily.     . OREGANO PO Take 150 capsules by mouth daily as needed (immune support).    . SUMAtriptan (IMITREX) 100 MG tablet One by mouth at onset of migraine; may repeat one pill 2 hours later if absolutely needed; max 2 pills in 24 hours (Patient taking differently: Take 100 mg by mouth See admin instructions. One by mouth at onset of migraine; may repeat one pill 2 hours later if absolutely needed; max 2 pills in 24 hours) 9 tablet 1  . Turmeric 500 MG CAPS Take 500 mg by mouth daily.     No current facility-administered medications on file prior to visit.    There are no Patient Instructions on file for this visit. No follow-ups on file.  Nicole Osborn A Leiby Pigeon, PA-C

## 2018-01-15 ENCOUNTER — Other Ambulatory Visit: Payer: Self-pay | Admitting: Family Medicine

## 2018-02-06 ENCOUNTER — Ambulatory Visit (INDEPENDENT_AMBULATORY_CARE_PROVIDER_SITE_OTHER): Payer: BC Managed Care – PPO | Admitting: Family Medicine

## 2018-02-06 ENCOUNTER — Encounter: Payer: Self-pay | Admitting: Family Medicine

## 2018-02-06 VITALS — BP 132/84 | HR 96 | Temp 98.4°F | Resp 14 | Ht 65.0 in | Wt 225.0 lb

## 2018-02-06 DIAGNOSIS — Z8719 Personal history of other diseases of the digestive system: Secondary | ICD-10-CM | POA: Insufficient documentation

## 2018-02-06 DIAGNOSIS — E034 Atrophy of thyroid (acquired): Secondary | ICD-10-CM | POA: Diagnosis not present

## 2018-02-06 DIAGNOSIS — J209 Acute bronchitis, unspecified: Secondary | ICD-10-CM | POA: Diagnosis not present

## 2018-02-06 DIAGNOSIS — Z6838 Body mass index (BMI) 38.0-38.9, adult: Secondary | ICD-10-CM | POA: Insufficient documentation

## 2018-02-06 DIAGNOSIS — E782 Mixed hyperlipidemia: Secondary | ICD-10-CM

## 2018-02-06 DIAGNOSIS — Z6837 Body mass index (BMI) 37.0-37.9, adult: Secondary | ICD-10-CM

## 2018-02-06 DIAGNOSIS — F330 Major depressive disorder, recurrent, mild: Secondary | ICD-10-CM | POA: Diagnosis not present

## 2018-02-06 MED ORDER — PREDNISONE 20 MG PO TABS: 20.0000 mg | ORAL_TABLET | Freq: Every day | ORAL | 0 refills | Status: DC

## 2018-02-06 MED ORDER — BENZONATATE 100 MG PO CAPS: 100.0000 mg | ORAL_CAPSULE | Freq: Three times a day (TID) | ORAL | 0 refills | Status: DC | PRN

## 2018-02-06 NOTE — Assessment & Plan Note (Signed)
Last TSH was normal

## 2018-02-06 NOTE — Progress Notes (Signed)
BP 132/84   Pulse 96   Temp 98.4 F (36.9 C) (Oral)   Resp 14   Ht 5\' 5"  (1.651 m)   Wt 225 lb (102.1 kg)   LMP 06/12/2016 (Exact Date)   SpO2 96%   BMI 37.44 kg/m    Subjective:    Patient ID: Nicole Osborn, female    DOB: 1970-10-02, 47 y.o.   MRN: [id number]  HPI: Nicole Osborn is a 47 y.o. female  Chief Complaint  Patient presents with  . URI    onset 3 day syptoms include: cough, sore throat, chest pain from cough, stuffy nose and sneezing  . Obesity    would like referral to bariatric surgery surgery    HPI Patient is here for a sick visit Started on Monday; sneezing and a little sore throat; progressed to cough, chest pain up high in the middle; stuffy nose; no body aches, no more than normal; no fevers; no travel; she has tried oil of oregano, tried that some and taking ibuprofen PM to help with sleeping; nyquil wears off too face; ears are okay for the most part, but she has had a little trouble hearing, little muffled; no swollen glands; no nausea or vomiting or diarrhea  She would like a referral to a bariatric surgery She has always tried to lose weight; she lost weight quickly after her son, then a year later had trouble and got hypothyroidism; after that, she has just gained for the last 18 years; trouble staying away from sweets and carbs; her job is very stressful; was tested for ADHD, more just depression with memory problems; trouble managing things at work; always behind; "eat my feelings"; to focus, the keeps the caffeine and carbs going; "it's pretty much an addiction" she says; she has been trying to do it on her own; she has had some vein problems now, she feels like her weight is starting to affect her health now; not just concerned about her appearance, but feeling it in her knees now too and other health issues; her mother had knee replacement; she has tried low carb diet, keto diet, exercise; tried Weight Watchers, but couldn't keep up with the points;  tried fruits and veggies smoothies; she is on her feet almost all day and does a fair amount of walking; tries to walk when she can, but trouble finding time; phone has a step counter, but not always on her person; she walked for an hour a week or two but then knees and hips are sore  he talked with someone about being on disability because of the migraines and anxiety and depression; the lawyer thought she would have a case, but when she talked to her neurologist, he did not think she'd qualify; she is getting migraines, some months better than others, maybe 5-10 migraines per month, but sumatriptan works, makes her relaxed and hard to stay on track; she had been seeing Dr. Nicolasa Ducking, psychiatrist; she has trouble keeping up with appointments and dates; she was planning to go after school year, but was released; I offered anther location; she says it's more trouble keeping up with things, not anxiety or depression, more keeping up with things, trouble organizing and prioritizing is how her depression manifests; on 40 mg of cymbalta right now; not much sex drive, so leary about increasing the dose; struggling financially so doesn't think life coach would be helpful  Hypothyroidism; last dose change showed normal TSH Lab Results  Component Value Date  TSH 0.65 09/06/2017   T4TOTAL 7.5 02/02/2016    Depression screen PHQ 2/9 02/06/2018 09/06/2017 12/07/2016 10/12/2016 04/23/2016  Decreased Interest 0 1 0 0 0  Down, Depressed, Hopeless 1 1 0 1 0  PHQ - 2 Score 1 2 0 1 0  Altered sleeping - 3 - - -  Tired, decreased energy - 1 - - -  Change in appetite - 1 - - -  Feeling bad or failure about yourself  - 1 - - -  Trouble concentrating - 2 - - -  Moving slowly or fidgety/restless - 0 - - -  Suicidal thoughts - 0 - - -  PHQ-9 Score - 10 - - -  Difficult doing work/chores - Very difficult - - -    Relevant past medical, surgical, family and social history reviewed Past Medical History:  Diagnosis Date    . Anxiety   . Arthritis   . Cholecystitis with cholelithiasis 09/19/2017  . Depression   . Elevated blood pressure reading    no meds  . GERD (gastroesophageal reflux disease)    occ  . Heartburn   . History of kidney stones    h/o  . Hyperlipidemia   . Hypothyroidism   . Migraine 09/23/2017  . Migraines    without aura  . Obesity   . Thyroid disease   . Varicose veins of both lower extremities 09/06/2017   Past Surgical History:  Procedure Laterality Date  . CHOLECYSTECTOMY N/A 10/08/2017   Procedure: LAPAROSCOPIC CHOLECYSTECTOMY;  Surgeon: Florene Glen, MD;  Location: [facility] ORS;  Service: General;  Laterality: N/A;  . Removal of extra tooth     age 75   Family History  Problem Relation Age of Onset  . Depression Mother   . Arthritis Mother   . Stroke Father   . Hypertension Father   . Breast cancer Paternal Aunt 75  . Emphysema Maternal Grandmother   . Emphysema Maternal Grandfather   . Dementia Paternal Grandmother   . Asthma Son   . Eczema Son   . Kidney disease Neg Hx   . Bladder Cancer Neg Hx    Social History   Tobacco Use  . Smoking status: Never Smoker  . Smokeless tobacco: Never Used  Substance Use Topics  . Alcohol use: No  . Drug use: No    Interim medical history since last visit reviewed. Allergies and medications reviewed  Review of Systems Per HPI unless specifically indicated above     Objective:    BP 132/84   Pulse 96   Temp 98.4 F (36.9 C) (Oral)   Resp 14   Ht 5\' 5"  (1.651 m)   Wt 225 lb (102.1 kg)   LMP 06/12/2016 (Exact Date)   SpO2 96%   BMI 37.44 kg/m   Wt Readings from Last 3 Encounters:  02/06/18 225 lb (102.1 kg)  01/01/18 221 lb 6.4 oz (100.4 kg)  10/18/17 217 lb 9.6 oz (98.7 kg)    Physical Exam  Constitutional: She appears well-developed and well-nourished.  HENT:  Right Ear: Tympanic membrane and ear canal normal. Tympanic membrane is not erythematous. No middle ear effusion.  Left Ear: Tympanic  membrane and ear canal normal. Tympanic membrane is not erythematous.  No middle ear effusion.  Nose: Rhinorrhea (scant clear) present. No mucosal edema.  Mouth/Throat: Mucous membranes are normal. No oropharyngeal exudate, posterior oropharyngeal edema or posterior oropharyngeal erythema.  Eyes: EOM are normal. No scleral icterus.  Cardiovascular: Normal rate and  regular rhythm.  Pulmonary/Chest: Effort normal and breath sounds normal.  Lymphadenopathy:    She has no cervical adenopathy.  Psychiatric: She has a normal mood and affect. Her behavior is normal.       Assessment & Plan:   Problem List Items Addressed This Visit      Endocrine   Hypothyroidism, unspecified    Last TSH was normal        Other   Morbid obesity (HCC)    BMI >35 plus gallbladder disease, hyperlipidemia      Relevant Orders   Amb Referral to Bariatric Surgery   Hyperlipidemia    Associated with obesity      History of gallbladder disease    Status post choly      Depression    And anxiety; she is welcome to call later in the summer if she wishes to increase the cymbalta from 40 mg daily to 60 mg daily, as she may have more stress with her parents moving       Other Visit Diagnoses    Acute bronchitis, unspecified organism    -  Primary   explained most likely viral; treat symptomatically; see AVS; tessalon perles for cough, very low dose prednisone for spasticity (declined SABA)   BMI 37.0-37.9, adult       BMI 37 with hyperlipidemia and gallbladder disease   Relevant Orders   Amb Referral to Bariatric Surgery       Follow up plan: No follow-ups on file.  An after-visit summary was printed and given to the patient at [place].  Please see the patient instructions which may contain other information and recommendations beyond what is mentioned above in the assessment and plan.  Meds ordered this encounter  Medications  . benzonatate (TESSALON PERLES) 100 MG capsule    Sig: Take 1  capsule (100 mg total) by mouth every 8 (eight) hours as needed for cough.    Dispense:  30 capsule    Refill:  0  . predniSONE (DELTASONE) 20 MG tablet    Sig: Take 1 tablet (20 mg total) by mouth daily. With meal    Dispense:  5 tablet    Refill:  0    Orders Placed This Encounter  Procedures  . Amb Referral to Bariatric Surgery

## 2018-02-06 NOTE — Assessment & Plan Note (Signed)
And anxiety; she is welcome to call later in the summer if she wishes to increase the cymbalta from 40 mg daily to 60 mg daily, as she may have more stress with her parents moving

## 2018-02-06 NOTE — Assessment & Plan Note (Signed)
Status post choly

## 2018-02-06 NOTE — Assessment & Plan Note (Signed)
Associated with obesity

## 2018-02-06 NOTE — Assessment & Plan Note (Signed)
BMI >35 plus gallbladder disease, hyperlipidemia

## 2018-02-06 NOTE — Patient Instructions (Addendum)
Use the cough medicine if needed Start the prednisone, take with food Okay to take ibuprofen or tylenol Okay to take Mucinex if desired  We'll have you see the bariatric surgeon  Check out the information at familydoctor.org entitled "Nutrition for Weight Loss: What You Need to Know about Fad Diets" Try to lose between 1-2 pounds per week by taking in fewer calories and burning off more calories You can succeed by limiting portions, limiting foods dense in calories and fat, becoming more active, and drinking 8 glasses of water a day (64 ounces) Don't skip meals, especially breakfast, as skipping meals may alter your metabolism Do not use over-the-counter weight loss pills or gimmicks that claim rapid weight loss A healthy BMI (or body mass index) is between 18.5 and 24.9 You can calculate your ideal BMI at the [place] website [employer]fr  Call me if you desire to increase the Cymbalta  Acute Bronchitis, Adult Acute bronchitis is sudden (acute) swelling of the air tubes (bronchi) in the lungs. Acute bronchitis causes these tubes to fill with mucus, which can make it hard to breathe. It can also cause coughing or wheezing. In adults, acute bronchitis usually goes away within 2 weeks. A cough caused by bronchitis may last up to 3 weeks. Smoking, allergies, and asthma can make the condition worse. Repeated episodes of bronchitis may cause further lung problems, such as chronic obstructive pulmonary disease (COPD). What are the causes? This condition can be caused by germs and by substances that irritate the lungs, including:  Cold and flu viruses. This condition is most often caused by the same virus that causes a cold.  Bacteria.  Exposure to tobacco smoke, dust, fumes, and air pollution.  What increases the risk? This condition is more likely to develop in people who:  Have close contact with someone with acute bronchitis.  Are  exposed to lung irritants, such as tobacco smoke, dust, fumes, and vapors.  Have a weak immune system.  Have a respiratory condition such as asthma.  What are the signs or symptoms? Symptoms of this condition include:  A cough.  Coughing up clear, yellow, or green mucus.  Wheezing.  Chest congestion.  Shortness of breath.  A fever.  Body aches.  Chills.  A sore throat.  How is this diagnosed? This condition is usually diagnosed with a physical exam. During the exam, your health care provider may order tests, such as chest X-rays, to rule out other conditions. He or she may also:  Test a sample of your mucus for bacterial infection.  Check the level of oxygen in your blood. This is done to check for pneumonia.  Do a chest X-ray or lung function testing to rule out pneumonia and other conditions.  Perform blood tests.  Your health care provider will also ask about your symptoms and medical history. How is this treated? Most cases of acute bronchitis clear up over time without treatment. Your health care provider may recommend:  Drinking more fluids. Drinking more makes your mucus thinner, which may make it easier to breathe.  Taking a medicine for a fever or cough.  Taking an antibiotic medicine.  Using an inhaler to help improve shortness of breath and to control a cough.  Using a cool mist vaporizer or humidifier to make it easier to breathe.  Follow these instructions at home: Medicines  Take over-the-counter and prescription medicines only as told by your health care provider.  If you were prescribed an antibiotic, take it as told  by your health care provider. Do not stop taking the antibiotic even if you start to feel better. General instructions  Get plenty of rest.  Drink enough fluids to keep your urine clear or pale yellow.  Avoid smoking and secondhand smoke. Exposure to cigarette smoke or irritating chemicals will make bronchitis worse. If you  smoke and you need help quitting, ask your health care provider. Quitting smoking will help your lungs heal faster.  Use an inhaler, cool mist vaporizer, or humidifier as told by your health care provider.  Keep all follow-up visits as told by your health care provider. This is important. How is this prevented? To lower your risk of getting this condition again:  Wash your hands often with soap and water. If soap and water are not available, use hand sanitizer.  Avoid contact with people who have cold symptoms.  Try not to touch your hands to your mouth, nose, or eyes.  Make sure to get the flu shot every year.  Contact a health care provider if:  Your symptoms do not improve in 2 weeks of treatment. Get help right away if:  You cough up blood.  You have chest pain.  You have severe shortness of breath.  You become dehydrated.  You faint or keep feeling like you are going to faint.  You keep vomiting.  You have a severe headache.  Your fever or chills gets worse. This information is not intended to replace advice given to you by your health care provider. Make sure you discuss any questions you have with your health care provider. Document Released: 10/18/2004 Document Revised: 04/04/2016 Document Reviewed: 02/29/2016 Elsevier Interactive Patient Education  2018 [employer].  Obesity, Adult Obesity is the condition of having too much total body fat. Being overweight or obese means that your weight is greater than what is considered healthy for your body size. Obesity is determined by a measurement called BMI. BMI is an estimate of body fat and is calculated from height and weight. For adults, a BMI of 30 or higher is considered obese. Obesity can eventually lead to other health concerns and major illnesses, including:  Stroke.  Coronary artery disease (CAD).  Type 2 diabetes.  Some types of cancer, including cancers of the colon, breast, uterus, and  gallbladder.  Osteoarthritis.  High blood pressure (hypertension).  High cholesterol.  Sleep apnea.  Gallbladder stones.  Infertility problems.  What are the causes? The main cause of obesity is taking in (consuming) more calories than your body uses for energy. Other factors that contribute to this condition may include:  Being born with genes that make you more likely to become obese.  Having a medical condition that causes obesity. These conditions include: ? Hypothyroidism. ? Polycystic ovarian syndrome (PCOS). ? Binge-eating disorder. ? Cushing syndrome.  Taking certain medicines, such as steroids, antidepressants, and seizure medicines.  Not being physically active (sedentary lifestyle).  Living where there are limited places to exercise safely or buy healthy foods.  Not getting enough sleep.  What increases the risk? The following factors may increase your risk of this condition:  Having a family history of obesity.  Being a woman of African-American descent.  Being a man of Hispanic descent.  What are the signs or symptoms? Having excessive body fat is the main symptom of this condition. How is this diagnosed? This condition may be diagnosed based on:  Your symptoms.  Your medical history.  A physical exam. Your health care provider may measure: ?  Your BMI. If you are an adult with a BMI between 25 and less than 30, you are considered overweight. If you are an adult with a BMI of 30 or higher, you are considered obese. ? The distances around your hips and your waist (circumferences). These may be compared to each other to help diagnose your condition. ? Your skinfold thickness. Your health care provider may gently pinch a fold of your skin and measure it.  How is this treated? Treatment for this condition often includes changing your lifestyle. Treatment may include some or all of the following:  Dietary changes. Work with your health care provider  and a dietitian to set a weight-loss goal that is healthy and reasonable for you. Dietary changes may include eating: ? Smaller portions. A portion size is the amount of a particular food that is healthy for you to eat at one time. This varies from person to person. ? Low-calorie or low-fat options. ? More whole grains, fruits, and vegetables.  Regular physical activity. This may include aerobic activity (cardio) and strength training.  Medicine to help you lose weight. Your health care provider may prescribe medicine if you are unable to lose 1 pound a week after 6 weeks of eating more healthily and doing more physical activity.  Surgery. Surgical options may include gastric banding and gastric bypass. Surgery may be done if: ? Other treatments have not helped to improve your condition. ? You have a BMI of 40 or higher. ? You have life-threatening health problems related to obesity.  Follow these instructions at home:  Eating and drinking   Follow recommendations from your health care provider about what you eat and drink. Your health care provider may advise you to: ? Limit fast foods, sweets, and processed snack foods. ? Choose low-fat options, such as low-fat milk instead of whole milk. ? Eat 5 or more servings of fruits or vegetables every day. ? Eat at home more often. This gives you more control over what you eat. ? Choose healthy foods when you eat out. ? Learn what a healthy portion size is. ? Keep low-fat snacks on hand. ? Avoid sugary drinks, such as soda, fruit juice, iced tea sweetened with sugar, and flavored milk. ? Eat a healthy breakfast.  Drink enough water to keep your urine clear or pale yellow.  Do not go without eating for long periods of time (do not fast) or follow a fad diet. Fasting and fad diets can be unhealthy and even dangerous. Physical Activity  Exercise regularly, as told by your health care provider. Ask your health care provider what types of  exercise are safe for you and how often you should exercise.  Warm up and stretch before being active.  Cool down and stretch after being active.  Rest between periods of activity. Lifestyle  Limit the time that you spend in front of your TV, computer, or video game system.  Find ways to reward yourself that do not involve food.  Limit alcohol intake to no more than 1 drink a day for nonpregnant women and 2 drinks a day for men. One drink equals 12 oz of beer, 5 oz of wine, or 1 oz of hard liquor. General instructions  Keep a weight loss journal to keep track of the food you eat and how much you exercise you get.  Take over-the-counter and prescription medicines only as told by your health care provider.  Take vitamins and supplements only as told by your health  care provider.  Consider joining a support group. Your health care provider may be able to recommend a support group.  Keep all follow-up visits as told by your health care provider. This is important. Contact a health care provider if:  You are unable to meet your weight loss goal after 6 weeks of dietary and lifestyle changes. This information is not intended to replace advice given to you by your health care provider. Make sure you discuss any questions you have with your health care provider. Document Released: 10/18/2004 Document Revised: 02/13/2016 Document Reviewed: 06/29/2015 Elsevier Interactive Patient Education  2018 [employer].

## 2018-02-07 ENCOUNTER — Telehealth (INDEPENDENT_AMBULATORY_CARE_PROVIDER_SITE_OTHER): Payer: Self-pay | Admitting: Vascular Surgery

## 2018-02-07 NOTE — Telephone Encounter (Signed)
Unable to leave VM informing patient to call and reschedule her appt because tech not available.

## 2018-02-20 ENCOUNTER — Ambulatory Visit: Payer: BC Managed Care – PPO | Admitting: Family Medicine

## 2018-02-20 ENCOUNTER — Encounter

## 2018-02-25 ENCOUNTER — Ambulatory Visit (INDEPENDENT_AMBULATORY_CARE_PROVIDER_SITE_OTHER): Payer: BC Managed Care – PPO | Admitting: Vascular Surgery

## 2018-02-25 ENCOUNTER — Encounter (INDEPENDENT_AMBULATORY_CARE_PROVIDER_SITE_OTHER): Payer: BC Managed Care – PPO

## 2018-04-02 ENCOUNTER — Encounter (INDEPENDENT_AMBULATORY_CARE_PROVIDER_SITE_OTHER): Payer: BC Managed Care – PPO

## 2018-04-02 ENCOUNTER — Ambulatory Visit (INDEPENDENT_AMBULATORY_CARE_PROVIDER_SITE_OTHER): Payer: BC Managed Care – PPO | Admitting: Vascular Surgery

## 2018-04-15 ENCOUNTER — Ambulatory Visit (INDEPENDENT_AMBULATORY_CARE_PROVIDER_SITE_OTHER): Payer: BC Managed Care – PPO | Admitting: Family Medicine

## 2018-04-15 ENCOUNTER — Encounter: Payer: Self-pay | Admitting: Family Medicine

## 2018-04-15 ENCOUNTER — Other Ambulatory Visit (HOSPITAL_COMMUNITY)
Admission: RE | Admit: 2018-04-15 | Discharge: 2018-04-15 | Disposition: A | Discharge status: Home / Self Care | Payer: BC Managed Care – PPO | Source: Ambulatory Visit | Attending: Family Medicine | Admitting: Family Medicine

## 2018-04-15 VITALS — BP 130/80 | HR 88 | Temp 98.4°F | Resp 16 | Ht 65.0 in | Wt 224.0 lb

## 2018-04-15 DIAGNOSIS — Z0000 Encounter for general adult medical examination without abnormal findings: Secondary | ICD-10-CM

## 2018-04-15 DIAGNOSIS — K644 Residual hemorrhoidal skin tags: Secondary | ICD-10-CM | POA: Diagnosis not present

## 2018-04-15 DIAGNOSIS — Z124 Encounter for screening for malignant neoplasm of cervix: Secondary | ICD-10-CM | POA: Diagnosis present

## 2018-04-15 NOTE — Progress Notes (Signed)
Patient ID: Nicole Osborn, female   DOB: 19-Jan-1971, 47 y.o.   MRN: [id number]   Subjective:   Nicole Osborn is a 47 y.o. female here for a complete physical exam  Interim issues since last visit:  Never heard back about bariatric surgery referral  USPSTF grade A and B recommendations Depression:  Depression screen [facility] 2/9 04/15/2018 04/15/2018 02/06/2018 09/06/2017 12/07/2016  Decreased Interest 1 1 0 1 0  Down, Depressed, Hopeless _0 0  PHQ - 2 Score _1 0  Altered sleeping 1 - - 3 -  Tired, decreased energy 1 - - 1 -  Change in appetite 1 - - 1 -  Feeling bad or failure about yourself  1 - - 1 -  Trouble concentrating 1 - - 2 -  Moving slowly or fidgety/restless 0 - - 0 -  Suicidal thoughts 0 - - 0 -  PHQ-9 Score 7 - - 10 -  Difficult doing work/chores Somewhat difficult - - Very difficult -   Hypertension: BP Readings from Last 3 Encounters:  04/15/18 130/80  02/06/18 132/84  01/01/18 (!) 148/84   Obesity: Wt Readings from Last 3 Encounters:  04/15/18 224 lb (101.6 kg)  02/06/18 225 lb (102.1 kg)  01/01/18 221 lb 6.4 oz (100.4 kg)   BMI Readings from Last 3 Encounters:  04/15/18 37.28 kg/m  02/06/18 37.44 kg/m  01/01/18 36.84 kg/m     Skin cancer: nothing worrisome, goes to derm Lung cancer:  nonsmoker Breast cancer: no lumps, then says she felt a ridge at the base on the right; tender to touch Colorectal cancer: no fam hx; will wait until 47 yo Cervical cancer screening: today; no hx of abnormal, one actually may have been repeated; no treatment BRCA gene screening: family hx of breast and/or ovarian cancer and/or pancreatic or metastatic prostate cancer? Aunt had breast cancer and had genetic testing, negative; she was a smoker HIV, hep B, hep C: not interested STD testing and prevention (chl/gon/syphilis): not intrested Intimate partner violence: no abuse Contraception: husband had vasectomy Osteoporosis: no steroids as a child Fall  prevention/vitamin D: discussed Immunizations: declined tetanus and flu Diet: likes fruits and veggies, too many sweets and carbs; 3-4 veggies a day Exercise: nothing regular, but takes short walks like 15 minutes when she can; lots going on Alcohol: no Tobacco use: no AAA: n/a Aspirin: n/a Glucose: Jan Glucose, Bld  Date Value Ref Range Status  10/09/2017 136 (H) 65 - 99 mg/dL Final  09/18/2017 102 (H) 65 - 99 mg/dL Final  04/23/2016 86 65 - 99 mg/dL Final   Lipids: Lab Results  Component Value Date   CHOL 205 (H) 04/24/2017   CHOL 211 (H) 04/23/2016   Lab Results  Component Value Date   HDL 39 (L) 04/24/2017   HDL 39 (L) 04/23/2016   Lab Results  Component Value Date   LDLCALC 127 (H) 04/24/2017   LDLCALC 123 04/23/2016   Lab Results  Component Value Date   TRIG 196 (H) 04/24/2017   TRIG 244 (H) 04/23/2016   Lab Results  Component Value Date   CHOLHDL 5.3 (H) 04/24/2017   CHOLHDL 5.4 (H) 04/23/2016   No results found for: LDLDIRECT   Past Medical History:  Diagnosis Date  . Anxiety   . Arthritis   . Cholecystitis with cholelithiasis 09/19/2017  . Depression   . Elevated blood pressure reading    no meds  . GERD (gastroesophageal reflux disease)  occ  . Heartburn   . History of kidney stones    h/o  . Hyperlipidemia   . Hypothyroidism   . Migraine 09/23/2017  . Migraines    without aura  . Obesity   . Thyroid disease   . Varicose veins of both lower extremities 09/06/2017   Past Surgical History:  Procedure Laterality Date  . CHOLECYSTECTOMY N/A 10/08/2017   Procedure: LAPAROSCOPIC CHOLECYSTECTOMY;  Surgeon: Florene Glen, MD;  Location: [facility] ORS;  Service: General;  Laterality: N/A;  . Removal of extra tooth     age 18   Family History  Problem Relation Age of Onset  . Depression Mother   . Arthritis Mother   . Stroke Father   . Hypertension Father   . Breast cancer Paternal Aunt 70  . Emphysema Maternal Grandmother   . Emphysema  Maternal Grandfather   . Dementia Paternal Grandmother   . Asthma Son   . Eczema Son   . Kidney disease Neg Hx   . Bladder Cancer Neg Hx    Social History   Tobacco Use  . Smoking status: Never Smoker  . Smokeless tobacco: Never Used  Substance Use Topics  . Alcohol use: No  . Drug use: No   Review of Systems  Constitutional: Negative for unexpected weight change.  Respiratory: Negative for wheezing.   Cardiovascular: Negative for chest pain.  Gastrointestinal: Negative for blood in stool.  Genitourinary: Negative for hematuria.    Objective:   Vitals:   04/15/18 1548  BP: 130/80  Pulse: 88  Resp: 16  Temp: 98.4 F (36.9 C)  TempSrc: Oral  SpO2: 98%  Weight: 224 lb (101.6 kg)  Height: _0  (1.651 m)   Body mass index is 37.28 kg/m. Wt Readings from Last 3 Encounters:  04/15/18 224 lb (101.6 kg)  02/06/18 225 lb (102.1 kg)  01/01/18 221 lb 6.4 oz (100.4 kg)   Physical Exam  Constitutional: She appears well-developed and well-nourished.  HENT:  Head: Normocephalic and atraumatic.  Eyes: Conjunctivae and EOM are normal. Right eye exhibits no hordeolum. Left eye exhibits no hordeolum. No scleral icterus.  Neck: Carotid bruit is not present. No thyromegaly present.  Cardiovascular: Normal rate, regular rhythm, S1 normal, S2 normal and normal heart sounds.  No extrasystoles are present.  Pulmonary/Chest: Effort normal and breath sounds normal. No respiratory distress. Right breast exhibits no inverted nipple, no mass, no nipple discharge, no skin change and no tenderness. Left breast exhibits no inverted nipple, no mass, no nipple discharge, no skin change and no tenderness. Breasts are symmetrical.  Abdominal: Soft. Normal appearance and bowel sounds are normal. She exhibits no distension, no abdominal bruit, no pulsatile midline mass and no mass. There is no hepatosplenomegaly. There is no tenderness. No hernia.  Genitourinary: Uterus normal.    Pelvic exam was  performed with patient prone. There is no rash or lesion on the right labia. There is no rash or lesion on the left labia. Cervix exhibits no motion tenderness. Right adnexum displays no mass, no tenderness and no fullness. Left adnexum displays no mass, no tenderness and no fullness.  Genitourinary Comments: Hemorrhoid tag at anus  Musculoskeletal: Normal range of motion. She exhibits no edema.  Lymphadenopathy:       Head (right side): No submandibular adenopathy present.       Head (left side): No submandibular adenopathy present.    She has no cervical adenopathy.    She has no axillary adenopathy.  Neurological:  She is alert. She displays no tremor. No cranial nerve deficit. She exhibits normal muscle tone. Gait normal.  Skin: Skin is warm and dry. No bruising and no ecchymosis noted. No cyanosis. No pallor.  Psychiatric: Her speech is normal and behavior is normal. Thought content normal. Her mood appears not anxious. She does not exhibit a depressed mood.    Assessment/Plan:   Problem List Items Addressed This Visit    None    Visit Diagnoses    Preventative health care    -  Primary   Relevant Orders   CBC with Differential/Platelet   COMPLETE METABOLIC PANEL WITH GFR   Lipid panel   TSH   Cervical cancer screening       Relevant Orders   Cytology - PAP   Hemorrhoidal skin tag       Relevant Orders   Ambulatory referral to Gastroenterology       No orders of the defined types were placed in this encounter.  Orders Placed This Encounter  Procedures  . CBC with Differential/Platelet    Standing Status:   Future    Standing Expiration Date:   09/15/2018  . COMPLETE METABOLIC PANEL WITH GFR    Standing Status:   Future    Standing Expiration Date:   09/15/2018  . Lipid panel    Standing Status:   Future    Standing Expiration Date:   09/15/2018  . TSH    Standing Status:   Future    Standing Expiration Date:   09/15/2018  . Ambulatory referral to Gastroenterology     Referral Priority:   Routine    Referral Type:   Consultation    Referral Reason:   Specialty Services Required    Referred to Provider:   Lin Landsman, MD    Number of Visits Requested:   1    Follow up plan: Return in about 1 year (around 04/16/2019) for complete physical; one month for bariatric surgery.  An After Visit Summary was printed and given to the patient.

## 2018-04-15 NOTE — Patient Instructions (Addendum)
Please return for fasting labs at your convenience Check out the information at familydoctor.org entitled "Nutrition for Weight Loss: What You Need to Know about Fad Diets" Try to lose between 1-2 pounds per week by taking in fewer calories and burning off more calories You can succeed by limiting portions, limiting foods dense in calories and fat, becoming more active, and drinking 8 glasses of water a day (64 ounces) Don't skip meals, especially breakfast, as skipping meals may alter your metabolism Do not use over-the-counter weight loss pills or gimmicks that claim rapid weight loss A healthy BMI (or body mass index) is between 18.5 and 24.9 You can calculate your ideal BMI at the [place] website ClubMon[employer].fr Health Maintenance, Female Adopting a healthy lifestyle and getting preventive care can go a long way to promote health and wellness. Talk with your health care provider about what schedule of regular examinations is right for you. This is a good chance for you to check in with your provider about disease prevention and staying healthy. In between checkups, there are plenty of things you can do on your own. Experts have done a lot of research about which lifestyle changes and preventive measures are most likely to keep you healthy. Ask your health care provider for more information. Weight and diet Eat a healthy diet  Be sure to include plenty of vegetables, fruits, low-fat dairy products, and lean protein.  Do not eat a lot of foods high in solid fats, added sugars, or salt.  Get regular exercise. This is one of the most important things you can do for your health. ? Most adults should exercise for at least 150 minutes each week. The exercise should increase your heart rate and make you sweat (moderate-intensity exercise). ? Most adults should also do strengthening exercises at least twice a week. This is in addition to the  moderate-intensity exercise.  Maintain a healthy weight  Body mass index (BMI) is a measurement that can be used to identify possible weight problems. It estimates body fat based on height and weight. Your health care provider can help determine your BMI and help you achieve or maintain a healthy weight.  For females 55 years of age and older: ? A BMI below 18.5 is considered underweight. ? A BMI of 18.5 to 24.9 is normal. ? A BMI of 25 to 29.9 is considered overweight. ? A BMI of 30 and above is considered obese.  Watch levels of cholesterol and blood lipids  You should start having your blood tested for lipids and cholesterol at 49 years of age, then have this test every 5 years.  You may need to have your cholesterol levels checked more often if: ? Your lipid or cholesterol levels are high. ? You are older than 50 years of age. ? You are at high risk for heart disease.  Cancer screening Lung Cancer  Lung cancer screening is recommended for adults 48-36 years old who are at high risk for lung cancer because of a history of smoking.  A yearly low-dose CT scan of the lungs is recommended for people who: ? Currently smoke. ? Have quit within the past 15 years. ? Have at least a 30-pack-year history of smoking. A pack year is smoking an average of one pack of cigarettes a day for 1 year.  Yearly screening should continue until it has been 15 years since you quit.  Yearly screening should stop if you develop a health problem that would prevent you from having  lung cancer treatment.  Breast Cancer  Practice breast self-awareness. This means understanding how your breasts normally appear and feel.  It also means doing regular breast self-exams. Let your health care provider know about any changes, no matter how small.  If you are in your 20s or 30s, you should have a clinical breast exam (CBE) by a health care provider every 1-3 years as part of a regular health exam.  If you  are 39 or older, have a CBE every year. Also consider having a breast X-ray (mammogram) every year.  If you have a family history of breast cancer, talk to your health care provider about genetic screening.  If you are at high risk for breast cancer, talk to your health care provider about having an MRI and a mammogram every year.  Breast cancer gene (BRCA) assessment is recommended for women who have family members with BRCA-related cancers. BRCA-related cancers include: ? Breast. ? Ovarian. ? Tubal. ? Peritoneal cancers.  Results of the assessment will determine the need for genetic counseling and BRCA1 and BRCA2 testing.  Cervical Cancer Your health care provider may recommend that you be screened regularly for cancer of the pelvic organs (ovaries, uterus, and vagina). This screening involves a pelvic examination, including checking for microscopic changes to the surface of your cervix (Pap test). You may be encouraged to have this screening done every 3 years, beginning at age 51.  For women ages 28-65, health care providers may recommend pelvic exams and Pap testing every 3 years, or they may recommend the Pap and pelvic exam, combined with testing for human papilloma virus (HPV), every 5 years. Some types of HPV increase your risk of cervical cancer. Testing for HPV may also be done on women of any age with unclear Pap test results.  Other health care providers may not recommend any screening for nonpregnant women who are considered low risk for pelvic cancer and who do not have symptoms. Ask your health care provider if a screening pelvic exam is right for you.  If you have had past treatment for cervical cancer or a condition that could lead to cancer, you need Pap tests and screening for cancer for at least 20 years after your treatment. If Pap tests have been discontinued, your risk factors (such as having a new sexual partner) need to be reassessed to determine if screening should  resume. Some women have medical problems that increase the chance of getting cervical cancer. In these cases, your health care provider may recommend more frequent screening and Pap tests.  Colorectal Cancer  This type of cancer can be detected and often prevented.  Routine colorectal cancer screening usually begins at 49 years of age and continues through 49 years of age.  Your health care provider may recommend screening at an earlier age if you have risk factors for colon cancer.  Your health care provider may also recommend using home test kits to check for hidden blood in the stool.  A small camera at the end of a tube can be used to examine your colon directly (sigmoidoscopy or colonoscopy). This is done to check for the earliest forms of colorectal cancer.  Routine screening usually begins at age 23.  Direct examination of the colon should be repeated every 5-10 years through 49 years of age. However, you may need to be screened more often if early forms of precancerous polyps or small growths are found.  Skin Cancer  Check your skin from head to  toe regularly.  Tell your health care provider about any new moles or changes in moles, especially if there is a change in a mole's shape or color.  Also tell your health care provider if you have a mole that is larger than the size of a pencil eraser.  Always use sunscreen. Apply sunscreen liberally and repeatedly throughout the day.  Protect yourself by wearing long sleeves, pants, a wide-brimmed hat, and sunglasses whenever you are outside.  Heart disease, diabetes, and high blood pressure  High blood pressure causes heart disease and increases the risk of stroke. High blood pressure is more likely to develop in: ? People who have blood pressure in the high end of the normal range (130-139/85-89 mm Hg). ? People who are overweight or obese. ? People who are African American.  If you are 25-36 years of age, have your blood  pressure checked every 3-5 years. If you are 27 years of age or older, have your blood pressure checked every year. You should have your blood pressure measured twice-once when you are at a hospital or clinic, and once when you are not at a hospital or clinic. Record the average of the two measurements. To check your blood pressure when you are not at a hospital or clinic, you can use: ? An automated blood pressure machine at a pharmacy. ? A home blood pressure monitor.  If you are between 44 years and 53 years old, ask your health care provider if you should take aspirin to prevent strokes.  Have regular diabetes screenings. This involves taking a blood sample to check your fasting blood sugar level. ? If you are at a normal weight and have a low risk for diabetes, have this test once every three years after 48 years of age. ? If you are overweight and have a high risk for diabetes, consider being tested at a younger age or more often. Preventing infection Hepatitis B  If you have a higher risk for hepatitis B, you should be screened for this virus. You are considered at high risk for hepatitis B if: ? You were born in a country where hepatitis B is common. Ask your health care provider which countries are considered high risk. ? Your parents were born in a high-risk country, and you have not been immunized against hepatitis B (hepatitis B vaccine). ? You have HIV or AIDS. ? You use needles to inject street drugs. ? You live with someone who has hepatitis B. ? You have had sex with someone who has hepatitis B. ? You get hemodialysis treatment. ? You take certain medicines for conditions, including cancer, organ transplantation, and autoimmune conditions.  Hepatitis C  Blood testing is recommended for: ? Everyone born from [birth year] through 1967. ? Anyone with known risk factors for hepatitis C.  Sexually transmitted infections (STIs)  You should be screened for sexually transmitted  infections (STIs) including gonorrhea and chlamydia if: ? You are sexually active and are younger than 49 years of age. ? You are older than 49 years of age and your health care provider tells you that you are at risk for this type of infection. ? Your sexual activity has changed since you were last screened and you are at an increased risk for chlamydia or gonorrhea. Ask your health care provider if you are at risk.  If you do not have HIV, but are at risk, it may be recommended that you take a prescription medicine daily to prevent HIV infection.  This is called pre-exposure prophylaxis (PrEP). You are considered at risk if: ? You are sexually active and do not regularly use condoms or know the HIV status of your partner(s). ? You take drugs by injection. ? You are sexually active with a partner who has HIV.  Talk with your health care provider about whether you are at high risk of being infected with HIV. If you choose to begin PrEP, you should first be tested for HIV. You should then be tested every 3 months for as long as you are taking PrEP. Pregnancy  If you are premenopausal and you may become pregnant, ask your health care provider about preconception counseling.  If you may become pregnant, take 400 to 800 micrograms (mcg) of folic acid every day.  If you want to prevent pregnancy, talk to your health care provider about birth control (contraception). Osteoporosis and menopause  Osteoporosis is a disease in which the bones lose minerals and strength with aging. This can result in serious bone fractures. Your risk for osteoporosis can be identified using a bone density scan.  If you are 24 years of age or older, or if you are at risk for osteoporosis and fractures, ask your health care provider if you should be screened.  Ask your health care provider whether you should take a calcium or vitamin D supplement to lower your risk for osteoporosis.  Menopause may have certain physical  symptoms and risks.  Hormone replacement therapy may reduce some of these symptoms and risks. Talk to your health care provider about whether hormone replacement therapy is right for you. Follow these instructions at home:  Schedule regular health, dental, and eye exams.  Stay current with your immunizations.  Do not use any tobacco products including cigarettes, chewing tobacco, or electronic cigarettes.  If you are pregnant, do not drink alcohol.  If you are breastfeeding, limit how much and how often you drink alcohol.  Limit alcohol intake to no more than 1 drink per day for nonpregnant women. One drink equals 12 ounces of beer, 5 ounces of wine, or 1 ounces of hard liquor.  Do not use street drugs.  Do not share needles.  Ask your health care provider for help if you need support or information about quitting drugs.  Tell your health care provider if you often feel depressed.  Tell your health care provider if you have ever been abused or do not feel safe at home. This information is not intended to replace advice given to you by your health care provider. Make sure you discuss any questions you have with your health care provider. Document Released: 03/26/2011 Document Revised: 02/16/2016 Document Reviewed: 06/14/2015 Elsevier Interactive Patient Education  2018 [employer].  Preventing Unhealthy [employer], Adult Staying at a healthy weight is important. When fat builds up in your body, you may become overweight or obese. These conditions put you at greater risk for developing certain health problems, such as heart disease, diabetes, sleeping problems, joint problems, and some cancers. Unhealthy weight gain is often the result of making unhealthy choices in what you eat. It is also a result of not getting enough exercise. You can make changes to your lifestyle to prevent obesity and stay as healthy as possible. What nutrition changes can be made? To maintain a healthy  weight and prevent obesity:  Eat only as much as your body needs. To do this: ? Pay attention to signs that you are hungry or full. Stop eating as soon  as you feel full. ? If you feel hungry, try drinking water first. Drink enough water so your urine is clear or pale yellow. ? Eat smaller portions. ? Look at serving sizes on food labels. Most foods contain more than one serving per container. ? Eat the recommended amount of calories for your gender and activity level. While most active people should eat around 2,000 calories per day, if you are trying to lose weight or are not very active, you main need to eat less calories. Talk to your health care provider or dietitian about how many calories you should eat each day.  Choose healthy foods, such as: ? Fruits and vegetables. Try to fill at least half of your plate at each meal with fruits and vegetables. ? Whole grains, such as whole wheat bread, brown rice, and quinoa. ? Lean meats, such as chicken or fish. ? Other healthy proteins, such as beans, eggs, or tofu. ? Healthy fats, such as nuts, seeds, fatty fish, and olive oil. ? Low-fat or fat-free dairy.  Check food labels and avoid food and drinks that: ? Are high in calories. ? Have added sugar. ? Are high in sodium. ? Have saturated fats or trans fats.  Limit how much you eat of the following foods: ? Prepackaged meals. ? Fast food. ? Fried foods. ? Processed meat, such as bacon, sausage, and deli meats. ? Fatty cuts of red meat and poultry with skin.  Cook foods in healthier ways, such as by baking, broiling, or grilling.  When grocery shopping, try to shop around the outside of the store. This helps you buy mostly fresh foods and avoid canned and prepackaged foods.  What lifestyle changes can be made?  Exercise at least 30 minutes 5 or more days each week. Exercising includes brisk walking, yard work, biking, running, swimming, and team sports like basketball and soccer. Ask  your health care provider which exercises are safe for you.  Do not use any products that contain nicotine or tobacco, such as cigarettes and e-cigarettes. If you need help quitting, ask your health care provider.  Limit alcohol intake to no more than 1 drink a day for nonpregnant women and 2 drinks a day for men. One drink equals 12 oz of beer, 5 oz of wine, or 1 oz of hard liquor.  Try to get 7-9 hours of sleep each night. What other changes can be made?  Keep a food and activity journal to keep track of: ? What you ate and how many calories you had. Remember to count sauces, dressings, and side dishes. ? Whether you were active, and what exercises you did. ? Your calorie, weight, and activity goals.  Check your weight regularly. Track any changes. If you notice you have gained weight, make changes to your diet or activity routine.  Avoid taking weight-loss medicines or supplements. Talk to your health care provider before starting any new medicine or supplement.  Talk to your health care provider before trying any new diet or exercise plan. Why are these changes important? Eating healthy, staying active, and having healthy habits not only help prevent obesity, they also:  Help you to manage stress and emotions.  Help you to connect with friends and family.  Improve your self-esteem.  Improve your sleep.  Prevent long-term health problems.  What can happen if changes are not made? Being obese or overweight can cause you to develop joint or bone problems, which can make it hard for you to stay  active or do activities you enjoy. Being obese or overweight also puts stress on your heart and lungs and can lead to health problems like diabetes, heart disease, and some cancers. Where to find more information: Talk with your health care provider or a dietitian about healthy eating and healthy lifestyle choices. You may also find other information through these resources:  [place]  Department of Agriculture MyPlate: FormerBoss[employer]no  American Heart Association: www.heart.org  Centers for Disease Control and Prevention: http://www.wolf.info/  Summary  Staying at a healthy weight is important. It helps prevent certain diseases and health problems, such as heart disease, diabetes, joint problems, sleep disorders, and some cancers.  Being obese or overweight can cause you to develop joint or bone problems, which can make it hard for you to stay active or do activities you enjoy.  You can prevent unhealthy weight gain by eating a healthy diet, exercising regularly, not smoking, limiting alcohol, and getting enough sleep.  Talk with your health care provider or a dietitian for guidance about healthy eating and healthy lifestyle choices. This information is not intended to replace advice given to you by your health care provider. Make sure you discuss any questions you have with your health care provider. Document Released: 09/11/2016 Document Revised: 10/17/2016 Document Reviewed: 10/17/2016 Elsevier Interactive Patient Education  [employer].

## 2018-04-17 LAB — CYTOLOGY - PAP
Diagnosis: NEGATIVE
HPV: NOT DETECTED

## 2018-04-18 ENCOUNTER — Encounter: Payer: Self-pay | Admitting: Gastroenterology

## 2018-04-23 ENCOUNTER — Other Ambulatory Visit: Payer: Self-pay | Admitting: Family Medicine

## 2018-04-23 NOTE — Telephone Encounter (Signed)
Lab Results  Component Value Date   TSH 0.65 09/06/2017   T4TOTAL 7.5 02/02/2016

## 2018-05-09 ENCOUNTER — Other Ambulatory Visit: Payer: Self-pay

## 2018-05-09 DIAGNOSIS — Z0000 Encounter for general adult medical examination without abnormal findings: Secondary | ICD-10-CM

## 2018-05-09 LAB — CBC WITH DIFFERENTIAL/PLATELET
Basophils Absolute: 90 cells/uL (ref 0–200)
Basophils Relative: 1.7 %
EOS ABS: 159 {cells}/uL (ref 15–500)
Eosinophils Relative: 3 %
HCT: 40.2 % (ref 35.0–45.0)
Hemoglobin: 13.6 g/dL (ref 11.7–15.5)
LYMPHS ABS: 1564 {cells}/uL (ref 850–3900)
MCH: 30.4 pg (ref 27.0–33.0)
MCHC: 33.8 g/dL (ref 32.0–36.0)
MCV: 89.7 fL (ref 80.0–100.0)
MPV: 10.6 fL (ref 7.5–12.5)
Monocytes Relative: 8.7 %
NEUTROS PCT: 57.1 %
Neutro Abs: 3026 cells/uL (ref 1500–7800)
Platelets: 250 10*3/uL (ref 140–400)
RBC: 4.48 10*6/uL (ref 3.80–5.10)
RDW: 12.8 % (ref 11.0–15.0)
Total Lymphocyte: 29.5 %
WBC mixed population: 461 cells/uL (ref 200–950)
WBC: 5.3 10*3/uL (ref 3.8–10.8)

## 2018-05-09 LAB — COMPLETE METABOLIC PANEL WITH GFR
AG RATIO: 1.6 (calc) (ref 1.0–2.5)
ALBUMIN MSPROF: 4.4 g/dL (ref 3.6–5.1)
ALT: 12 U/L (ref 6–29)
AST: 14 U/L (ref 10–35)
Alkaline phosphatase (APISO): 90 U/L (ref 33–115)
BILIRUBIN TOTAL: 0.4 mg/dL (ref 0.2–1.2)
BUN: 12 mg/dL (ref 7–25)
CALCIUM: 9.2 mg/dL (ref 8.6–10.2)
CHLORIDE: 106 mmol/L (ref 98–110)
CO2: 27 mmol/L (ref 20–32)
Creat: 0.73 mg/dL (ref 0.50–1.10)
GFR, EST AFRICAN AMERICAN: 113 mL/min/{1.73_m2} (ref >=60)
GFR, EST NON AFRICAN AMERICAN: 97 mL/min/{1.73_m2} (ref >=60)
Globulin: 2.7 g/dL (calc) (ref 1.9–3.7)
Glucose, Bld: 108 mg/dL — ABNORMAL HIGH (ref 65–99)
POTASSIUM: 4.1 mmol/L (ref 3.5–5.3)
Sodium: 142 mmol/L (ref 135–146)
Total Protein: 7.1 g/dL (ref 6.1–8.1)

## 2018-05-09 LAB — LIPID PANEL
Cholesterol: 213 mg/dL — ABNORMAL HIGH (ref <200)
HDL: 41 mg/dL — ABNORMAL LOW (ref >50)
LDL Cholesterol (Calc): 140 mg/dL (calc) — ABNORMAL HIGH
Non-HDL Cholesterol (Calc): 172 mg/dL (calc) — ABNORMAL HIGH (ref <130)
Total CHOL/HDL Ratio: 5.2 (calc) — ABNORMAL HIGH (ref <5.0)
Triglycerides: 180 mg/dL — ABNORMAL HIGH (ref <150)

## 2018-05-09 LAB — TSH: TSH: 1.22 m[IU]/L

## 2018-05-16 ENCOUNTER — Ambulatory Visit: Payer: BC Managed Care – PPO | Admitting: Family Medicine

## 2018-05-17 ENCOUNTER — Other Ambulatory Visit: Payer: Self-pay | Admitting: Family Medicine

## 2018-05-17 MED ORDER — LEVOTHYROXINE SODIUM 112 MCG PO TABS: 112.0000 ug | ORAL_TABLET | Freq: Every day | ORAL | 2 refills | Status: DC

## 2018-05-17 NOTE — Progress Notes (Signed)
The 10-year ASCVD risk score Mikey Bussing DC Brooke Bonito., et al., 2013) is: 1.8%   Values used to calculate the score:     Age: 47 years     Sex: Female     Is Non-Hispanic African American: No     Diabetic: No     Tobacco smoker: No     Systolic Blood Pressure: 125 mmHg     Is BP treated: No     HDL Cholesterol: 41 mg/dL     Total Cholesterol: 213 mg/dL

## 2018-06-18 ENCOUNTER — Ambulatory Visit: Payer: BC Managed Care – PPO | Admitting: Gastroenterology

## 2018-06-26 ENCOUNTER — Other Ambulatory Visit: Payer: Self-pay | Admitting: Family Medicine

## 2018-06-26 DIAGNOSIS — F330 Major depressive disorder, recurrent, mild: Secondary | ICD-10-CM

## 2018-07-15 ENCOUNTER — Encounter (INDEPENDENT_AMBULATORY_CARE_PROVIDER_SITE_OTHER): Payer: Self-pay | Admitting: Vascular Surgery

## 2018-07-15 ENCOUNTER — Ambulatory Visit (INDEPENDENT_AMBULATORY_CARE_PROVIDER_SITE_OTHER): Payer: BC Managed Care – PPO

## 2018-07-15 ENCOUNTER — Ambulatory Visit (INDEPENDENT_AMBULATORY_CARE_PROVIDER_SITE_OTHER): Payer: BC Managed Care – PPO | Admitting: Vascular Surgery

## 2018-07-15 VITALS — BP 150/83 | HR 63 | Resp 17 | Ht 65.0 in | Wt 228.0 lb

## 2018-07-15 DIAGNOSIS — R600 Localized edema: Secondary | ICD-10-CM | POA: Diagnosis not present

## 2018-07-15 DIAGNOSIS — I872 Venous insufficiency (chronic) (peripheral): Secondary | ICD-10-CM

## 2018-07-15 DIAGNOSIS — I890 Lymphedema, not elsewhere classified: Secondary | ICD-10-CM | POA: Diagnosis not present

## 2018-07-15 DIAGNOSIS — I8393 Asymptomatic varicose veins of bilateral lower extremities: Secondary | ICD-10-CM | POA: Diagnosis not present

## 2018-07-15 NOTE — Progress Notes (Signed)
Subjective:    Patient ID: Nicole Osborn, female    DOB: Feb 17, 1971, 47 y.o.   MRN: [id number] Chief Complaint  Patient presents with  . Follow-up    pt conv bil reflux    Patient presents for a 37-month follow-up.  Patient was originally seen for bilateral lower extremity varicosities and edema.  Since the patient's initial visit, she has been engaging in conservative therapy including wearing medical grade one compression socks, elevating his legs and remaining active with minimal improvement in her symptoms.  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  The patient notes that her discomfort and edema worsen towards the end of the day.  The patient does feel like her symptoms have progressed to the point that they have become lifestyle limiting.  The patient underwent a bilateral lower extremity venous reflux study which was notable for Right: Abnormal reflux times were noted in the great saphenous vein at the knee and great saphenous vein at the proximal calf.  Evidence of chronic venous insufficiency is detected in the deep system.  There is no evidence of deep vein thrombosis or superficial thrombophlebitis. Left: Abnormal reflux times were noted in the common femoral vein.  Evidence of chronic venous insufficiency is detected in the deep system.  There is no evidence of deep vein thrombosis or superficial thrombophlebitis.  The patient denies any claudication-like symptoms, rest pain or ulcer formation to the bilateral lower extremity.  Review of Systems  Constitutional: Negative.   HENT: Negative.   Eyes: Negative.   Respiratory: Negative.   Cardiovascular: Positive for leg swelling.       Painful varicose veins  Gastrointestinal: Negative.   Endocrine: Negative.   Genitourinary: Negative.   Musculoskeletal: Negative.   Skin: Negative.   Allergic/Immunologic: Negative.   Neurological: Negative.   Hematological: Negative.   Psychiatric/Behavioral:  Negative.       Objective:   Physical Exam  Constitutional: She is oriented to person, place, and time. She appears well-developed and well-nourished. No distress.  HENT:  Head: Normocephalic and atraumatic.  Right Ear: External ear normal.  Left Ear: External ear normal.  Eyes: Pupils are equal, round, and reactive to light. Conjunctivae and EOM are normal.  Neck: Normal range of motion.  Cardiovascular: Normal rate, regular rhythm, normal heart sounds and intact distal pulses.  Pulses:      Radial pulses are 2+ on the right side, and 2+ on the left side.  Hard to palpate pedal pulses due to edema and body habitus however the bilateral feet are warm.  There is a good capillary refill.   Pulmonary/Chest: Effort normal and breath sounds normal.  Musculoskeletal: She exhibits edema (Mild nonpitting edema noticed bilaterally).  Neurological: She is alert and oriented to person, place, and time.  Skin: She is not diaphoretic.  Scattered less than 1 cm varicosities to the bilateral legs.  There is no stasis dermatitis, fibrosis, cellulitis or active ulcerations noted at this time.  Psychiatric: She has a normal mood and affect. Her behavior is normal. Judgment and thought content normal.  Vitals reviewed.  BP (!) 150/83 (BP Location: Right Arm)   Pulse 63   Resp 17   Ht 5\' 5"  (1.651 m)   Wt 228 lb (103.4 kg)   LMP 06/12/2016 (Exact Date)   BMI 37.94 kg/m   Past Medical History:  Diagnosis Date  . Anxiety   . Arthritis   . Cholecystitis with cholelithiasis  09/19/2017  . Depression   . Elevated blood pressure reading    no meds  . GERD (gastroesophageal reflux disease)    occ  . Heartburn   . History of kidney stones    h/o  . Hyperlipidemia   . Hypothyroidism   . Migraine 09/23/2017  . Migraines    without aura  . Obesity   . Thyroid disease   . Varicose veins of both lower extremities 09/06/2017   Social History   Socioeconomic History  . Marital status: Married      Spouse name: Not on file  . Number of children: Not on file  . Years of education: Not on file  . Highest education level: Not on file  Occupational History  . Not on file  Social Needs  . Financial resource strain: Not on file  . Food insecurity:    Worry: Not on file    Inability: Not on file  . Transportation needs:    Medical: Not on file    Non-medical: Not on file  Tobacco Use  . Smoking status: Never Smoker  . Smokeless tobacco: Never Used  Substance and Sexual Activity  . Alcohol use: No  . Drug use: No  . Sexual activity: Yes    Birth control/protection: Surgical, Other-see comments    Comment: Husband with vastectomy   Lifestyle  . Physical activity:    Days per week: Not on file    Minutes per session: Not on file  . Stress: Not on file  Relationships  . Social connections:    Talks on phone: Not on file    Gets together: Not on file    Attends religious service: Not on file    Active member of club or organization: Not on file    Attends meetings of clubs or organizations: Not on file    Relationship status: Not on file  . Intimate partner violence:    Fear of current or ex partner: Not on file    Emotionally abused: Not on file    Physically abused: Not on file    Forced sexual activity: Not on file  Other Topics Concern  . Not on file  Social History Narrative  . Not on file   Past Surgical History:  Procedure Laterality Date  . CHOLECYSTECTOMY N/A 10/08/2017   Procedure: LAPAROSCOPIC CHOLECYSTECTOMY;  Surgeon: Florene Glen, MD;  Location: [facility] ORS;  Service: General;  Laterality: N/A;  . Removal of extra tooth     age 70   Family History  Problem Relation Age of Onset  . Depression Mother   . Arthritis Mother   . Stroke Father   . Hypertension Father   . Breast cancer Paternal Aunt 23  . Emphysema Maternal Grandmother   . Emphysema Maternal Grandfather   . Dementia Paternal Grandmother   . Asthma Son   . Eczema Son   . Kidney  disease Neg Hx   . Bladder Cancer Neg Hx    Allergies  Allergen Reactions  . Cefaclor Swelling    Lips swelling       Assessment & Plan:  Patient presents for a 73-month follow-up.  Patient was originally seen for bilateral lower extremity varicosities and edema.  Since the patient's initial visit, she has been engaging in conservative therapy including wearing medical grade one compression socks, elevating his legs and remaining active with minimal improvement in her symptoms.  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  The patient notes that her discomfort and edema worsen towards the end of the day.  The patient does feel like her symptoms have progressed to the point that they have become lifestyle limiting.  The patient underwent a bilateral lower extremity venous reflux study which was notable for Right: Abnormal reflux times were noted in the great saphenous vein at the knee and great saphenous vein at the proximal calf.  Evidence of chronic venous insufficiency is detected in the deep system.  There is no evidence of deep vein thrombosis or superficial thrombophlebitis. Left: Abnormal reflux times were noted in the common femoral vein.  Evidence of chronic venous insufficiency is detected in the deep system.  There is no evidence of deep vein thrombosis or superficial thrombophlebitis.  The patient denies any claudication-like symptoms, rest pain or ulcer formation to the bilateral lower extremity.  1. Chronic venous insufficiency - New Patient with deep venous reflux to the bilateral legs. Due to the anatomical location of the deep venous reflux she is not a candidate for endovenous laser ablation The patient does have venous reflux to the right great saphenous vein. The patient would be a candidate for endovenous laser ablation to that area At this time, the patient would like to think about it and if she would like to proceed she will call the office The  patient was given a brochure in regard to the endovenous laser ablation procedure The patient should continue engaging conservative therapy until she makes up her mind  2. Lymphedema - New The patient would also be a candidate for the added therapy of a lymphedema pump We discussed the lymphedema pump and how it works The patient was also given a brochure about the lymphedema pump At this time, the patient would also like to think about the lymphedema pump before ordering it If the patient would like to move forward she can call the office at any time and I will be happy to order one Meantime the patient should continue engaging conservative therapy  3. Varicose veins of both lower extremities, unspecified whether complicated - Stable As above  The patient would like to follow-up PRN.  Current Outpatient Medications on File Prior to Visit  Medication Sig Dispense Refill  . calcium carbonate (TUMS - DOSED IN MG ELEMENTAL CALCIUM) 500 MG chewable tablet Chew 2-3 tablets by mouth 2 (two) times daily as needed for indigestion or heartburn.    . Cholecalciferol (VITAMIN D3) 2000 units capsule Take 2,000 Units by mouth daily.     . DULoxetine (CYMBALTA) 20 MG capsule TAKE 2 CAPSULES BY MOUTH EVERY DAY (Patient taking differently: 40 mg daily. ) 60 capsule 0  . ibuprofen (ADVIL,MOTRIN) 200 MG tablet Take 400-600 mg by mouth every 6 (six) hours as needed for headache or mild pain (2-3 tablets depending on pain).    . Ibuprofen-Diphenhydramine Cit (IBUPROFEN PM PO) Take 1 tablet by mouth at bedtime as needed (sleep).     Marland Kitchen levothyroxine (SYNTHROID, LEVOTHROID) 112 MCG tablet Take 1 tablet (112 mcg total) by mouth daily before breakfast. 30 tablet 2  . Magnesium 250 MG TABS Take 250 mg by mouth at bedtime.     . Multiple Vitamin (MULTI-VITAMINS) TABS Take 1 tablet by mouth daily.     . OREGANO PO Take 150 capsules by mouth daily as needed (immune support).    . SUMAtriptan (IMITREX) 100 MG tablet  One by mouth at onset of migraine; may repeat one pill  2 hours later if absolutely needed; max 2 pills in 24 hours (Patient taking differently: Take 100 mg by mouth See admin instructions. One by mouth at onset of migraine; may repeat one pill 2 hours later if absolutely needed; max 2 pills in 24 hours) 9 tablet 1  . Turmeric 500 MG CAPS Take 500 mg by mouth daily.     No current facility-administered medications on file prior to visit.    There are no Patient Instructions on file for this visit. No follow-ups on file.  Bella Brummet A Numa Schroeter, PA-C

## 2018-07-16 ENCOUNTER — Ambulatory Visit: Payer: BC Managed Care – PPO | Admitting: Gastroenterology

## 2018-07-23 ENCOUNTER — Other Ambulatory Visit: Payer: Self-pay

## 2018-07-23 ENCOUNTER — Encounter

## 2018-07-23 ENCOUNTER — Ambulatory Visit (INDEPENDENT_AMBULATORY_CARE_PROVIDER_SITE_OTHER): Payer: BC Managed Care – PPO | Admitting: Vascular Surgery

## 2018-07-23 ENCOUNTER — Encounter (INDEPENDENT_AMBULATORY_CARE_PROVIDER_SITE_OTHER): Payer: BC Managed Care – PPO

## 2018-07-24 ENCOUNTER — Other Ambulatory Visit: Payer: Self-pay | Admitting: Family Medicine

## 2018-07-24 ENCOUNTER — Encounter: Payer: Self-pay | Admitting: Gastroenterology

## 2018-07-24 ENCOUNTER — Ambulatory Visit: Payer: BC Managed Care – PPO | Admitting: Nurse Practitioner

## 2018-07-24 ENCOUNTER — Encounter: Payer: Self-pay | Admitting: Nurse Practitioner

## 2018-07-24 ENCOUNTER — Ambulatory Visit: Payer: BC Managed Care – PPO | Admitting: Gastroenterology

## 2018-07-24 VITALS — BP 114/90 | HR 80 | Temp 98.5°F | Resp 16 | Ht 65.0 in | Wt 226.6 lb

## 2018-07-24 VITALS — BP 142/70 | HR 104 | Ht 65.0 in | Wt 230.4 lb

## 2018-07-24 DIAGNOSIS — K219 Gastro-esophageal reflux disease without esophagitis: Secondary | ICD-10-CM

## 2018-07-24 DIAGNOSIS — R739 Hyperglycemia, unspecified: Secondary | ICD-10-CM

## 2018-07-24 DIAGNOSIS — E782 Mixed hyperlipidemia: Secondary | ICD-10-CM | POA: Diagnosis not present

## 2018-07-24 DIAGNOSIS — Z9884 Bariatric surgery status: Secondary | ICD-10-CM

## 2018-07-24 DIAGNOSIS — E559 Vitamin D deficiency, unspecified: Secondary | ICD-10-CM

## 2018-07-24 DIAGNOSIS — K641 Second degree hemorrhoids: Secondary | ICD-10-CM | POA: Diagnosis not present

## 2018-07-24 DIAGNOSIS — Z79899 Other long term (current) drug therapy: Secondary | ICD-10-CM

## 2018-07-24 DIAGNOSIS — R030 Elevated blood-pressure reading, without diagnosis of hypertension: Secondary | ICD-10-CM

## 2018-07-24 DIAGNOSIS — F330 Major depressive disorder, recurrent, mild: Secondary | ICD-10-CM

## 2018-07-24 DIAGNOSIS — Z8719 Personal history of other diseases of the digestive system: Secondary | ICD-10-CM

## 2018-07-24 DIAGNOSIS — E034 Atrophy of thyroid (acquired): Secondary | ICD-10-CM

## 2018-07-24 NOTE — Progress Notes (Signed)
PROCEDURE NOTE: The patient presents with symptomatic grade 2 hemorrhoids, unresponsive to maximal medical therapy, requesting rubber band ligation of his/her hemorrhoidal disease.  All risks, benefits and alternative forms of therapy were described and informed consent was obtained.  In the Left Lateral Decubitus position (if anoscopy is performed) anoscopic examination revealed grade 2 hemorrhoids in the all position(s).   The decision was made to band the RA internal hemorrhoid, and the [place] was used to perform band ligation without complication.  Digital anorectal examination was then performed to assure proper positioning of the band, and to adjust the banded tissue as required.  The patient was discharged home without pain or other issues.  Dietary and behavioral recommendations were given and (if necessary - prescriptions were given), along with follow-up instructions.  The patient will return 2 weeks for follow-up and possible additional banding as required.  No complications were encountered and the patient tolerated the procedure well.

## 2018-07-24 NOTE — Progress Notes (Signed)
Nicole Darby, MD [address]  [place]  [place], [place] [address]  Main: [phone]  Fax: [phone]    Gastroenterology Consultation  Referring Provider:     Arnetha Courser, MD Primary Care Physician:  Arnetha Courser, MD Primary Gastroenterologist:  Dr. Cephas Osborn Reason for Consultation:     Hemorrhoids        HPI:   NITASHA Osborn is a 47 y.o. female referred by Dr. Arnetha Courser, MD  for consultation & management of 2 years h/o perianal itching, burning, hard to clean, occasional blood on wiping (every 53months or so), protrusion of tissue and has to push back in.  Denies constipation, or diarrhea.  Denies any other GI symptoms She works as an Chief Technology Officer  NSAIDs: None  Antiplts/Anticoagulants/Anti thrombotics: None  GI Procedures: None She denies any GI surgeries She denies family history of GI malignancy She denies smoking or alcohol use  Past Medical History:  Diagnosis Date  . Anxiety   . Arthritis   . Cholecystitis with cholelithiasis 09/19/2017  . Depression   . Elevated blood pressure reading    no meds  . GERD (gastroesophageal reflux disease)    occ  . Heartburn   . History of kidney stones    h/o  . Hyperlipidemia   . Hypothyroidism   . Migraine 09/23/2017  . Migraines    without aura  . Obesity   . Thyroid disease   . Varicose veins of both lower extremities 09/06/2017    Past Surgical History:  Procedure Laterality Date  . CHOLECYSTECTOMY N/A 10/08/2017   Procedure: LAPAROSCOPIC CHOLECYSTECTOMY;  Surgeon: Florene Glen, MD;  Location: [facility] ORS;  Service: General;  Laterality: N/A;  . Removal of extra tooth     age 47    Current Outpatient Medications:  .  calcium carbonate (OS-CAL) 1250 (500 Ca) MG chewable tablet, Chew by mouth., Disp: , Rfl:  .  calcium carbonate (TUMS - DOSED IN MG ELEMENTAL CALCIUM) 500 MG chewable tablet, Chew 2-3 tablets by mouth 2 (two) times daily as needed for indigestion  or heartburn., Disp: , Rfl:  .  Cholecalciferol (VITAMIN D3) 2000 units capsule, Take 2,000 Units by mouth daily. , Disp: , Rfl:  .  DULoxetine 40 MG CPEP, Take 40 mg by mouth daily., Disp: 90 capsule, Rfl: 0 .  ibuprofen (ADVIL,MOTRIN) 200 MG tablet, Take 400-600 mg by mouth every 6 (six) hours as needed for headache or mild pain (2-3 tablets depending on pain)., Disp: , Rfl:  .  Ibuprofen-Diphenhydramine Cit (IBUPROFEN PM PO), Take 1 tablet by mouth at bedtime as needed (sleep). , Disp: , Rfl:  .  levothyroxine (SYNTHROID, LEVOTHROID) 112 MCG tablet, Take 1 tablet (112 mcg total) by mouth daily before breakfast., Disp: 30 tablet, Rfl: 2 .  levothyroxine (SYNTHROID, LEVOTHROID) 125 MCG tablet, levothyroxine 125 mcg tablet, Disp: , Rfl:  .  Magnesium 250 MG TABS, Take 250 mg by mouth at bedtime. , Disp: , Rfl:  .  Multiple Vitamin (MULTI-VITAMINS) TABS, Take 1 tablet by mouth daily. , Disp: , Rfl:  .  OREGANO PO, Take 150 capsules by mouth daily as needed (immune support)., Disp: , Rfl:  .  promethazine (PHENERGAN) 25 MG suppository, USE ONE SUPPOSITORY RECTALLY EVERY 6 HOURS AS NEEDED FOR NAUSEA FOR UP TO 7 DAYS, Disp: , Rfl:  .  SUMAtriptan (IMITREX) 100 MG tablet, One by mouth at onset of migraine; may repeat one pill 2 hours  later if absolutely needed; max 2 pills in 24 hours (Patient taking differently: Take 100 mg by mouth See admin instructions. One by mouth at onset of migraine; may repeat one pill 2 hours later if absolutely needed; max 2 pills in 24 hours), Disp: 9 tablet, Rfl: 1 .  Turmeric 500 MG CAPS, Take 500 mg by mouth daily., Disp: , Rfl:     Family History  Problem Relation Age of Onset  . Depression Mother   . Arthritis Mother   . Stroke Father   . Hypertension Father   . Breast cancer Paternal Aunt 36  . Emphysema Maternal Grandmother   . Emphysema Maternal Grandfather   . Dementia Paternal Grandmother   . Asthma Son   . Eczema Son   . Kidney disease Neg Hx   .  Bladder Cancer Neg Hx      Social History   Tobacco Use  . Smoking status: Never Smoker  . Smokeless tobacco: Never Used  Substance Use Topics  . Alcohol use: No  . Drug use: No    Allergies as of 07/24/2018 - Review Complete 07/24/2018  Allergen Reaction Noted  . Cefaclor Swelling 12/15/2015    Review of Systems:    All systems reviewed and negative except where noted in HPI.   Physical Exam:  BP (!) 142/70   Pulse (!) 104   Ht 5\' 5"  (1.651 m)   Wt 230 lb 6.4 oz (104.5 kg)   LMP 06/12/2016 (Exact Date)   BMI 38.34 kg/m  Patient's last menstrual period was 06/12/2016 (exact date).  General:   Alert,  Well-developed, well-nourished, pleasant and cooperative in NAD Head:  Normocephalic and atraumatic. Eyes:  Sclera clear, no icterus.   Conjunctiva pink. Ears:  Normal auditory acuity. Nose:  No deformity, discharge, or lesions. Mouth:  No deformity or lesions,oropharynx pink & moist. Neck:  Supple; no masses or thyromegaly. Lungs:  Respirations even and unlabored.  Clear throughout to auscultation.   No wheezes, crackles, or rhonchi. No acute distress. Heart:  Regular rate and rhythm; no murmurs, clicks, rubs, or gallops. Abdomen:  Normal bowel sounds. Soft, non-tender and non-distended without masses, hepatosplenomegaly or hernias noted.  No guarding or rebound tenderness.   Rectal: Anoscopy revealed normal mucosa in the rectal vault, solid brown stool.  Large internal and external hemorrhoids Msk:  Symmetrical without gross deformities. Good, equal movement & strength bilaterally. Pulses:  Normal pulses noted. Extremities:  No clubbing or edema.  No cyanosis. Neurologic:  Alert and oriented x3;  grossly normal neurologically. Skin:  Intact without significant lesions or rashes. No jaundice. Lymph Nodes:  No significant cervical adenopathy. Psych:  Alert and cooperative. Normal mood and affect.  Imaging Studies: Reviewed  Assessment and Plan:   Nicole Osborn is a  47 y.o. Caucasian female with obesity, status post laparoscopic cholecystectomy, hypothyroidism seen in consultation for symptomatic hemorrhoids, grade 2  Grade 2 hemorrhoids: Discussed with her about outpatient hemorrhoid ligation, risks and benefits explained Patient agreeable, consent obtained Perform hemorrhoid ligation today   Follow up in 2 weeks   Nicole Darby, MD

## 2018-07-24 NOTE — Patient Instructions (Addendum)
Ask bariatric surgeon if the following can help with coverage; let us know if you need a new referral: comorbid condition: bilateral hip and knee pain, acid-reflux, hyperlipidemia, elevated blood sugar reading, history gall bladder disease, hypertension, depression, chronic venous insufficiency.  - Wear compression stockings throughout the day, elevate as often as you can  _________________ Your goal blood pressure is less than 140 mmHg on top and under 90 on the bottom. Try to follow the DASH guidelines (DASH stands for Dietary Approaches to Stop Hypertension) Try to limit the sodium in your diet.  Ideally, consume less than 1.5 grams (less than 1,500mg ) per day. Do not add salt when cooking or at the table.  Check the sodium amount on labels when shopping, and choose items lower in sodium when given a choice. Avoid or limit foods that already contain a lot of sodium. Eat a diet rich in fruits and vegetables and whole grains.

## 2018-07-24 NOTE — Progress Notes (Signed)
Name: Nicole Osborn   MRN: [id number]    DOB: September 18, 1971   Date:07/24/2018       Progress Note  Subjective  Chief Complaint  Chief Complaint  Patient presents with  . Follow-up    from bariatric surgery    HPI  Patient was referred to [facility] for bariatric surgery; states she qualified for surgery but not from insurance. Had to leave that appointment and was unable to get her labs done there; would like to get them done here.   Patient has comorbid conditions related to weight: Patient endorses bilateral hip and knee pain, acid-reflux, hyperlipidemia, elevated blood sugar reading, history gall bladder disease, hypertension, depression, chronic venous insufficiency.   Hypertension:  has had elevated readings in the past; notes increased stress at work; drinks a lot of sodas.  BP Readings from Last 3 Encounters:  07/24/18 114/90  07/15/18 (!) 150/83  04/15/18 130/80      Patient Active Problem List   Diagnosis Date Noted  . Chronic venous insufficiency 07/15/2018  . Lymphedema 07/15/2018  . History of gallbladder disease 02/06/2018  . Morbid obesity ([place]) 02/06/2018  . Bilateral lower extremity edema 01/01/2018  . Depression 09/23/2017  . Hypertension 09/23/2017  . Migraine 09/23/2017  . Varicose veins of both lower extremities 09/06/2017  . Hyperlipidemia 12/23/2016  . Anxiety 12/23/2016  . Lung nodule 12/11/2016  . Increased frequency of headaches 04/28/2016  . Swelling of left ankle joint 04/23/2016  . Hypothyroidism, unspecified 04/23/2016    Past Medical History:  Diagnosis Date  . Anxiety   . Arthritis   . Cholecystitis with cholelithiasis 09/19/2017  . Depression   . Elevated blood pressure reading    no meds  . GERD (gastroesophageal reflux disease)    occ  . Heartburn   . History of kidney stones    h/o  . Hyperlipidemia   . Hypothyroidism   . Migraine 09/23/2017  . Migraines    without aura  . Obesity   . Thyroid disease   . Varicose veins of  both lower extremities 09/06/2017    Past Surgical History:  Procedure Laterality Date  . CHOLECYSTECTOMY N/A 10/08/2017   Procedure: LAPAROSCOPIC CHOLECYSTECTOMY;  Surgeon: Florene Glen, MD;  Location: [facility] ORS;  Service: General;  Laterality: N/A;  . Removal of extra tooth     age 43    Social History   Tobacco Use  . Smoking status: Never Smoker  . Smokeless tobacco: Never Used  Substance Use Topics  . Alcohol use: No     Current Outpatient Medications:  .  calcium carbonate (TUMS - DOSED IN MG ELEMENTAL CALCIUM) 500 MG chewable tablet, Chew 2-3 tablets by mouth 2 (two) times daily as needed for indigestion or heartburn., Disp: , Rfl:  .  Cholecalciferol (VITAMIN D3) 2000 units capsule, Take 2,000 Units by mouth daily. , Disp: , Rfl:  .  DULoxetine (CYMBALTA) 20 MG capsule, TAKE 2 CAPSULES BY MOUTH EVERY DAY (Patient taking differently: 40 mg daily. ), Disp: 60 capsule, Rfl: 0 .  ibuprofen (ADVIL,MOTRIN) 200 MG tablet, Take 400-600 mg by mouth every 6 (six) hours as needed for headache or mild pain (2-3 tablets depending on pain)., Disp: , Rfl:  .  Ibuprofen-Diphenhydramine Cit (IBUPROFEN PM PO), Take 1 tablet by mouth at bedtime as needed (sleep). , Disp: , Rfl:  .  levothyroxine (SYNTHROID, LEVOTHROID) 112 MCG tablet, Take 1 tablet (112 mcg total) by mouth daily before breakfast., Disp: 30 tablet, Rfl: 2 .  Magnesium 250 MG TABS, Take 250 mg by mouth at bedtime. , Disp: , Rfl:  .  Multiple Vitamin (MULTI-VITAMINS) TABS, Take 1 tablet by mouth daily. , Disp: , Rfl:  .  OREGANO PO, Take 150 capsules by mouth daily as needed (immune support)., Disp: , Rfl:  .  SUMAtriptan (IMITREX) 100 MG tablet, One by mouth at onset of migraine; may repeat one pill 2 hours later if absolutely needed; max 2 pills in 24 hours (Patient taking differently: Take 100 mg by mouth See admin instructions. One by mouth at onset of migraine; may repeat one pill 2 hours later if absolutely needed; max 2  pills in 24 hours), Disp: 9 tablet, Rfl: 1 .  Turmeric 500 MG CAPS, Take 500 mg by mouth daily., Disp: , Rfl:  .  calcium carbonate (OS-CAL) 1250 (500 Ca) MG chewable tablet, Chew by mouth., Disp: , Rfl:  .  levothyroxine (SYNTHROID, LEVOTHROID) 125 MCG tablet, levothyroxine 125 mcg tablet, Disp: , Rfl:  .  promethazine (PHENERGAN) 25 MG suppository, USE ONE SUPPOSITORY RECTALLY EVERY 6 HOURS AS NEEDED FOR NAUSEA FOR UP TO 7 DAYS, Disp: , Rfl:   Allergies  Allergen Reactions  . Cefaclor Swelling    Lips swelling     ROS   No other specific complaints in a complete review of systems (except as listed in HPI above).  Objective  Vitals:   07/24/18 1136 07/24/18 1148 07/24/18 1236  BP: (!) 140/100 (!) 140/100 114/90  Pulse: (!) 113  80  Resp: 16    Temp: 98.5 F (36.9 C)    TempSrc: Oral    SpO2: 96%    Weight: 226 lb 9.6 oz (102.8 kg)    Height: 5\' 5"  (1.651 m)      Body mass index is 37.71 kg/m.  Nursing Note and Vital Signs reviewed.  Physical Exam  Constitutional: Patient appears well-developed and well-nourished. Obese  No distress.  HEENT: head atraumatic, normocephalic,  neck supple,  Cardiovascular: Normal rate, regular rhythm and normal heart sounds.  No murmur heard. No BLE edema. Varicose veins noted in bilateral legs.  Pulmonary/Chest: Effort normal and breath sounds normal. No respiratory distress. Abdominal: Soft.  There is no tenderness. Bowel sounds normal throughout  Psychiatric: Patient has a normal mood and affect. behavior is normal. Judgment and thought content normal.   No results found for this or any previous visit (from the past 48 hour(s)).  Assessment & Plan  1. Bariatric surgery status  - Vitamin D (25 hydroxy) - HgB A1c - B12 - TSH - COMPLETE METABOLIC PANEL WITH GFR - CBC - Fe+TIBC+Fer  2. Morbid obesity ([place]) Had consultation with bariatric surgery did not have time for labs there; requesting to get here; gathered information  about comorbid conditions to assist with insurance coverage. Will contact us if she would like referral to closer place.  - HgB A1c  3. History of gallbladder disease Removed, discussed low fat diet   4. Mixed hyperlipidemia Discussed diet   5. Hypothyroidism due to acquired atrophy of thyroid - TSH  6. Elevated blood sugar - HgB A1c  7. Medication management - COMPLETE METABOLIC PANEL WITH GFR - CBC  8. Vitamin D deficiency Takes supplements  - Vitamin D (25 hydroxy)  9. Gastroesophageal reflux disease without esophagitis Takes antacids  10. Elevated blood pressure reading Follow up in one week  Face-to-face time with patient was more than 25 minutes, >50% time spent counseling and coordination of care

## 2018-07-25 ENCOUNTER — Encounter: Payer: Self-pay | Admitting: Nurse Practitioner

## 2018-07-25 DIAGNOSIS — R7303 Prediabetes: Secondary | ICD-10-CM | POA: Insufficient documentation

## 2018-07-25 LAB — COMPLETE METABOLIC PANEL WITH GFR
AG Ratio: 1.5 (calc) (ref 1.0–2.5)
ALBUMIN MSPROF: 4.3 g/dL (ref 3.6–5.1)
ALKALINE PHOSPHATASE (APISO): 93 U/L (ref 33–115)
ALT: 11 U/L (ref 6–29)
AST: 14 U/L (ref 10–35)
BILIRUBIN TOTAL: 0.4 mg/dL (ref 0.2–1.2)
BUN: 15 mg/dL (ref 7–25)
CHLORIDE: 106 mmol/L (ref 98–110)
CO2: 27 mmol/L (ref 20–32)
CREATININE: 0.78 mg/dL (ref 0.50–1.10)
Calcium: 9.5 mg/dL (ref 8.6–10.2)
GFR, Est African American: 104 mL/min/{1.73_m2} (ref >=60)
GFR, Est Non African American: 90 mL/min/{1.73_m2} (ref >=60)
GLUCOSE: 85 mg/dL (ref 65–139)
Globulin: 2.9 g/dL (calc) (ref 1.9–3.7)
Potassium: 4.1 mmol/L (ref 3.5–5.3)
Sodium: 142 mmol/L (ref 135–146)
Total Protein: 7.2 g/dL (ref 6.1–8.1)

## 2018-07-25 LAB — CBC
HEMATOCRIT: 40.2 % (ref 35.0–45.0)
Hemoglobin: 13.5 g/dL (ref 11.7–15.5)
MCH: 30.2 pg (ref 27.0–33.0)
MCHC: 33.6 g/dL (ref 32.0–36.0)
MCV: 89.9 fL (ref 80.0–100.0)
MPV: 10.8 fL (ref 7.5–12.5)
Platelets: 274 10*3/uL (ref 140–400)
RBC: 4.47 10*6/uL (ref 3.80–5.10)
RDW: 12.9 % (ref 11.0–15.0)
WBC: 6.2 10*3/uL (ref 3.8–10.8)

## 2018-07-25 LAB — HEMOGLOBIN A1C
EAG (MMOL/L): 6.6 (calc)
Hgb A1c MFr Bld: 5.8 % of total Hgb — ABNORMAL HIGH (ref <5.7)
MEAN PLASMA GLUCOSE: 120 (calc)

## 2018-07-25 LAB — IRON,TIBC AND FERRITIN PANEL
%SAT: 27 % (ref 16–45)
FERRITIN: 46 ng/mL (ref 16–232)
Iron: 99 ug/dL (ref 40–190)
TIBC: 373 ug/dL (ref 250–450)

## 2018-07-25 LAB — VITAMIN B12: Vitamin B-12: 449 pg/mL (ref 200–1100)

## 2018-07-25 LAB — TSH: TSH: 2.31 m[IU]/L

## 2018-07-25 LAB — VITAMIN D 25 HYDROXY (VIT D DEFICIENCY, FRACTURES): Vit D, 25-Hydroxy: 30 ng/mL (ref 30–100)

## 2018-08-01 ENCOUNTER — Ambulatory Visit: Payer: BC Managed Care – PPO

## 2018-08-01 VITALS — BP 134/82 | HR 80

## 2018-08-01 DIAGNOSIS — R030 Elevated blood-pressure reading, without diagnosis of hypertension: Secondary | ICD-10-CM

## 2018-08-01 NOTE — Progress Notes (Signed)
Patient here for 1 week follow-up blood pressure check since at last visit it was elevated.  Patient is not currently on any medications.  Blood pressure today is 134/82 pulse 80. Consulted with Dr. lada and we will continue current regimen.

## 2018-08-04 ENCOUNTER — Other Ambulatory Visit: Payer: Self-pay | Admitting: Family Medicine

## 2018-08-04 DIAGNOSIS — F330 Major depressive disorder, recurrent, mild: Secondary | ICD-10-CM

## 2018-08-15 ENCOUNTER — Ambulatory Visit: Payer: BC Managed Care – PPO | Admitting: Gastroenterology

## 2018-08-15 ENCOUNTER — Other Ambulatory Visit: Payer: Self-pay

## 2018-08-15 ENCOUNTER — Encounter: Payer: Self-pay | Admitting: Gastroenterology

## 2018-08-15 VITALS — BP 126/79 | HR 75 | Resp 16 | Ht 65.0 in | Wt 225.0 lb

## 2018-08-15 DIAGNOSIS — K641 Second degree hemorrhoids: Secondary | ICD-10-CM

## 2018-08-15 NOTE — Progress Notes (Signed)

## 2018-08-24 ENCOUNTER — Other Ambulatory Visit: Payer: Self-pay | Admitting: Family Medicine

## 2018-08-25 ENCOUNTER — Telehealth: Payer: Self-pay | Admitting: Family Medicine

## 2018-08-25 MED ORDER — LEVOTHYROXINE SODIUM 112 MCG PO TABS: 112.0000 ug | ORAL_TABLET | Freq: Every day | ORAL | 6 refills | Status: DC

## 2018-08-25 NOTE — Telephone Encounter (Signed)
Patient's medicine list has two different doses of thyroid medicine Please get the story on that, what is she taking, and is someone else managing this now

## 2018-08-25 NOTE — Telephone Encounter (Signed)
Left detailed voicemail

## 2018-08-25 NOTE — Telephone Encounter (Signed)
Patient stated that Dr. Sanda Klein is the only provider and Dr. Sanda Klein lowered the medication from 125 to 112mg . So the levothyroxine (SYNTHROID, LEVOTHROID) 112 MCG tablet [[id number]]  Is the current medication. Please advise.

## 2018-08-25 NOTE — Telephone Encounter (Signed)
Patient called has some questions?  She was denied for weight loss surgery by ins due to insufficiency of diagnosis.  Wants to know if you would know if the new dx of pre diabetes and the venous insufficiency would now cover it?  Also wanted to know if weight loss surgery would cure venous insuffiencey?

## 2018-08-25 NOTE — Telephone Encounter (Signed)
Copied from [place] [id number]. Topic: Quick Communication - See Telephone Encounter >> Aug 25, 2018 12:54 PM Blase Mess A wrote: CRM for notification. See Telephone encounter for: 08/25/18.  Patient would like a call back from Dr. Delight Ovens medical assistant regarding her prediabetes and please advise.   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

## 2018-08-27 NOTE — Telephone Encounter (Signed)
Left detailed voicemail

## 2018-08-27 NOTE — Telephone Encounter (Signed)
Did the bariatric surgeon deny her or did her insurance company deny her? If she has seen the bariatric surgeon, I think these questions and issues are best handled by them

## 2018-09-01 ENCOUNTER — Ambulatory Visit: Payer: BC Managed Care – PPO | Admitting: Family Medicine

## 2018-09-01 ENCOUNTER — Ambulatory Visit
Admission: RE | Admit: 2018-09-01 | Discharge: 2018-09-01 | Disposition: A | Discharge status: Home / Self Care | Payer: BC Managed Care – PPO | Attending: Family Medicine | Admitting: Family Medicine

## 2018-09-01 ENCOUNTER — Encounter: Payer: Self-pay | Admitting: Family Medicine

## 2018-09-01 ENCOUNTER — Ambulatory Visit
Admission: RE | Admit: 2018-09-01 | Discharge: 2018-09-01 | Disposition: A | Discharge status: Home / Self Care | Payer: BC Managed Care – PPO | Source: Ambulatory Visit | Attending: Family Medicine | Admitting: Family Medicine

## 2018-09-01 VITALS — BP 128/80 | HR 99 | Temp 97.6°F | Ht 64.5 in | Wt 226.8 lb

## 2018-09-01 DIAGNOSIS — R0683 Snoring: Secondary | ICD-10-CM

## 2018-09-01 DIAGNOSIS — I10 Essential (primary) hypertension: Secondary | ICD-10-CM

## 2018-09-01 DIAGNOSIS — R05 Cough: Secondary | ICD-10-CM | POA: Insufficient documentation

## 2018-09-01 DIAGNOSIS — I8393 Asymptomatic varicose veins of bilateral lower extremities: Secondary | ICD-10-CM

## 2018-09-01 DIAGNOSIS — D1721 Benign lipomatous neoplasm of skin and subcutaneous tissue of right arm: Secondary | ICD-10-CM

## 2018-09-01 DIAGNOSIS — Z1239 Encounter for other screening for malignant neoplasm of breast: Secondary | ICD-10-CM

## 2018-09-01 DIAGNOSIS — R059 Cough, unspecified: Secondary | ICD-10-CM

## 2018-09-01 DIAGNOSIS — G43009 Migraine without aura, not intractable, without status migrainosus: Secondary | ICD-10-CM | POA: Diagnosis not present

## 2018-09-01 DIAGNOSIS — Z8719 Personal history of other diseases of the digestive system: Secondary | ICD-10-CM

## 2018-09-01 DIAGNOSIS — R7303 Prediabetes: Secondary | ICD-10-CM

## 2018-09-01 DIAGNOSIS — R32 Unspecified urinary incontinence: Secondary | ICD-10-CM

## 2018-09-01 DIAGNOSIS — E782 Mixed hyperlipidemia: Secondary | ICD-10-CM

## 2018-09-01 DIAGNOSIS — N950 Postmenopausal bleeding: Secondary | ICD-10-CM

## 2018-09-01 NOTE — Assessment & Plan Note (Signed)
I believe significant weight loss would help the patient slow progression or even completely avoid the deveopment of future type 2 diabetes

## 2018-09-01 NOTE — Assessment & Plan Note (Addendum)
Likely impacted by her obesity; I believe that significant weight loss would substantially improve her lipid profile, which may in turn prevent a heart attack or stroke in future; she would like to try TLC

## 2018-09-01 NOTE — Assessment & Plan Note (Signed)
Possibly related to obesity; already did pelvic floor PT

## 2018-09-01 NOTE — Assessment & Plan Note (Signed)
Likely the result of obesity

## 2018-09-01 NOTE — Patient Instructions (Addendum)
Check out the information at familydoctor.org entitled "Nutrition for Weight Loss: What You Need to Know about Fad Diets" Try to lose between 1-2 pounds per week by taking in fewer calories and burning off more calories You can succeed by limiting portions, limiting foods dense in calories and fat, becoming more active, and drinking 8 glasses of water a day (64 ounces) Don't skip meals, especially breakfast, as skipping meals may alter your metabolism Do not use over-the-counter weight loss pills or gimmicks that claim rapid weight loss A healthy BMI (or body mass index) is between 18.5 and 24.9 You can calculate your ideal BMI at the NIH website ClubMonetize.fr Try to limit saturated fats in your diet (bologna, hot dogs, barbeque, cheeseburgers, hamburgers, steak, bacon, sausage, cheese, etc.) and get more fresh fruits, vegetables, and whole grains  Preventing Unhealthy Weight Gain, Adult Staying at a healthy weight is important. When fat builds up in your body, you may become overweight or obese. These conditions put you at greater risk for developing certain health problems, such as heart disease, diabetes, sleeping problems, joint problems, and some cancers. Unhealthy weight gain is often the result of making unhealthy choices in what you eat. It is also a result of not getting enough exercise. You can make changes to your lifestyle to prevent obesity and stay as healthy as possible. What nutrition changes can be made? To maintain a healthy weight and prevent obesity:  Eat only as much as your body needs. To do this: ? Pay attention to signs that you are hungry or full. Stop eating as soon as you feel full. ? If you feel hungry, try drinking water first. Drink enough water so your urine is clear or pale yellow. ? Eat smaller portions. ? Look at serving sizes on food labels. Most foods contain more than one serving per container. ? Eat the  recommended amount of calories for your gender and activity level. While most active people should eat around 2,000 calories per day, if you are trying to lose weight or are not very active, you main need to eat less calories. Talk to your health care provider or dietitian about how many calories you should eat each day.  Choose healthy foods, such as: ? Fruits and vegetables. Try to fill at least half of your plate at each meal with fruits and vegetables. ? Whole grains, such as whole wheat bread, brown rice, and quinoa. ? Lean meats, such as chicken or fish. ? Other healthy proteins, such as beans, eggs, or tofu. ? Healthy fats, such as nuts, seeds, fatty fish, and olive oil. ? Low-fat or fat-free dairy.  Check food labels and avoid food and drinks that: ? Are high in calories. ? Have added sugar. ? Are high in sodium. ? Have saturated fats or trans fats.  Limit how much you eat of the following foods: ? Prepackaged meals. ? Fast food. ? Fried foods. ? Processed meat, such as bacon, sausage, and deli meats. ? Fatty cuts of red meat and poultry with skin.  Cook foods in healthier ways, such as by baking, broiling, or grilling.  When grocery shopping, try to shop around the outside of the store. This helps you buy mostly fresh foods and avoid canned and prepackaged foods.  What lifestyle changes can be made?  Exercise at least 30 minutes 5 or more days each week. Exercising includes brisk walking, yard work, biking, running, swimming, and team sports like basketball and soccer. Ask your health care provider  which exercises are safe for you.  Do not use any products that contain nicotine or tobacco, such as cigarettes and e-cigarettes. If you need help quitting, ask your health care provider.  Limit alcohol intake to no more than 1 drink a day for nonpregnant women and 2 drinks a day for men. One drink equals 12 oz of beer, 5 oz of wine, or 1 oz of hard liquor.  Try to get 7-9  hours of sleep each night. What other changes can be made?  Keep a food and activity journal to keep track of: ? What you ate and how many calories you had. Remember to count sauces, dressings, and side dishes. ? Whether you were active, and what exercises you did. ? Your calorie, weight, and activity goals.  Check your weight regularly. Track any changes. If you notice you have gained weight, make changes to your diet or activity routine.  Avoid taking weight-loss medicines or supplements. Talk to your health care provider before starting any new medicine or supplement.  Talk to your health care provider before trying any new diet or exercise plan. Why are these changes important? Eating healthy, staying active, and having healthy habits not only help prevent obesity, they also:  Help you to manage stress and emotions.  Help you to connect with friends and family.  Improve your self-esteem.  Improve your sleep.  Prevent long-term health problems.  What can happen if changes are not made? Being obese or overweight can cause you to develop joint or bone problems, which can make it hard for you to stay active or do activities you enjoy. Being obese or overweight also puts stress on your heart and lungs and can lead to health problems like diabetes, heart disease, and some cancers. Where to find more information: Talk with your health care provider or a dietitian about healthy eating and healthy lifestyle choices. You may also find other information through these resources:  [place] Department of Agriculture MyPlate: FormerBoss.no  American Heart Association: www.heart.org  Centers for Disease Control and Prevention: http://www.wolf.info/  Summary  Staying at a healthy weight is important. It helps prevent certain diseases and health problems, such as heart disease, diabetes, joint problems, sleep disorders, and some cancers.  Being obese or overweight can cause you to develop joint  or bone problems, which can make it hard for you to stay active or do activities you enjoy.  You can prevent unhealthy weight gain by eating a healthy diet, exercising regularly, not smoking, limiting alcohol, and getting enough sleep.  Talk with your health care provider or a dietitian for guidance about healthy eating and healthy lifestyle choices. This information is not intended to replace advice given to you by your health care provider. Make sure you discuss any questions you have with your health care provider. Document Released: 09/11/2016 Document Revised: 10/17/2016 Document Reviewed: 10/17/2016 Elsevier Interactive Patient Education  2018 [employer].  Obesity, Adult Obesity is the condition of having too much total body fat. Being overweight or obese means that your weight is greater than what is considered healthy for your body size. Obesity is determined by a measurement called BMI. BMI is an estimate of body fat and is calculated from height and weight. For adults, a BMI of 30 or higher is considered obese. Obesity can eventually lead to other health concerns and major illnesses, including:  Stroke.  Coronary artery disease (CAD).  Type 2 diabetes.  Some types of cancer, including cancers of the colon,  breast, uterus, and gallbladder.  Osteoarthritis.  High blood pressure (hypertension).  High cholesterol.  Sleep apnea.  Gallbladder stones.  Infertility problems.  What are the causes? The main cause of obesity is taking in (consuming) more calories than your body uses for energy. Other factors that contribute to this condition may include:  Being born with genes that make you more likely to become obese.  Having a medical condition that causes obesity. These conditions include: ? Hypothyroidism. ? Polycystic ovarian syndrome (PCOS). ? Binge-eating disorder. ? Cushing syndrome.  Taking certain medicines, such as steroids, antidepressants, and seizure  medicines.  Not being physically active (sedentary lifestyle).  Living where there are limited places to exercise safely or buy healthy foods.  Not getting enough sleep.  What increases the risk? The following factors may increase your risk of this condition:  Having a family history of obesity.  Being a woman of African-American descent.  Being a man of Hispanic descent.  What are the signs or symptoms? Having excessive body fat is the main symptom of this condition. How is this diagnosed? This condition may be diagnosed based on:  Your symptoms.  Your medical history.  A physical exam. Your health care provider may measure: ? Your BMI. If you are an adult with a BMI between 25 and less than 30, you are considered overweight. If you are an adult with a BMI of 30 or higher, you are considered obese. ? The distances around your hips and your waist (circumferences). These may be compared to each other to help diagnose your condition. ? Your skinfold thickness. Your health care provider may gently pinch a fold of your skin and measure it.  How is this treated? Treatment for this condition often includes changing your lifestyle. Treatment may include some or all of the following:  Dietary changes. Work with your health care provider and a dietitian to set a weight-loss goal that is healthy and reasonable for you. Dietary changes may include eating: ? Smaller portions. A portion size is the amount of a particular food that is healthy for you to eat at one time. This varies from person to person. ? Low-calorie or low-fat options. ? More whole grains, fruits, and vegetables.  Regular physical activity. This may include aerobic activity (cardio) and strength training.  Medicine to help you lose weight. Your health care provider may prescribe medicine if you are unable to lose 1 pound a week after 6 weeks of eating more healthily and doing more physical activity.  Surgery. Surgical  options may include gastric banding and gastric bypass. Surgery may be done if: ? Other treatments have not helped to improve your condition. ? You have a BMI of 40 or higher. ? You have life-threatening health problems related to obesity.  Follow these instructions at home:  Eating and drinking   Follow recommendations from your health care provider about what you eat and drink. Your health care provider may advise you to: ? Limit fast foods, sweets, and processed snack foods. ? Choose low-fat options, such as low-fat milk instead of whole milk. ? Eat 5 or more servings of fruits or vegetables every day. ? Eat at home more often. This gives you more control over what you eat. ? Choose healthy foods when you eat out. ? Learn what a healthy portion size is. ? Keep low-fat snacks on hand. ? Avoid sugary drinks, such as soda, fruit juice, iced tea sweetened with sugar, and flavored milk. ? Eat a healthy  breakfast.  Drink enough water to keep your urine clear or pale yellow.  Do not go without eating for long periods of time (do not fast) or follow a fad diet. Fasting and fad diets can be unhealthy and even dangerous. Physical Activity  Exercise regularly, as told by your health care provider. Ask your health care provider what types of exercise are safe for you and how often you should exercise.  Warm up and stretch before being active.  Cool down and stretch after being active.  Rest between periods of activity. Lifestyle  Limit the time that you spend in front of your TV, computer, or video game system.  Find ways to reward yourself that do not involve food.  Limit alcohol intake to no more than 1 drink a day for nonpregnant women and 2 drinks a day for men. One drink equals 12 oz of beer, 5 oz of wine, or 1 oz of hard liquor. General instructions  Keep a weight loss journal to keep track of the food you eat and how much you exercise you get.  Take over-the-counter and  prescription medicines only as told by your health care provider.  Take vitamins and supplements only as told by your health care provider.  Consider joining a support group. Your health care provider may be able to recommend a support group.  Keep all follow-up visits as told by your health care provider. This is important. Contact a health care provider if:  You are unable to meet your weight loss goal after 6 weeks of dietary and lifestyle changes. This information is not intended to replace advice given to you by your health care provider. Make sure you discuss any questions you have with your health care provider. Document Released: 10/18/2004 Document Revised: 02/13/2016 Document Reviewed: 06/29/2015 Elsevier Interactive Patient Education  2018 [employer] [place] Eating Plan DASH stands for "Dietary Approaches to Stop Hypertension." The DASH eating plan is a healthy eating plan that has been shown to reduce high blood pressure (hypertension). It may also reduce your risk for type 2 diabetes, heart disease, and stroke. The DASH eating plan may also help with weight loss. What are tips for following this plan? General guidelines  Avoid eating more than 2,300 mg (milligrams) of salt (sodium) a day. If you have hypertension, you may need to reduce your sodium intake to 1,500 mg a day.  Limit alcohol intake to no more than 1 drink a day for nonpregnant women and 2 drinks a day for men. One drink equals 12 oz of beer, 5 oz of wine, or 1 oz of hard liquor.  Work with your health care provider to maintain a healthy body weight or to lose weight. Ask what an ideal weight is for you.  Get at least 30 minutes of exercise that causes your heart to beat faster (aerobic exercise) most days of the week. Activities may include walking, swimming, or biking.  Work with your health care provider or diet and nutrition specialist (dietitian) to adjust your eating plan to your individual calorie  needs. Reading food labels  Check food labels for the amount of sodium per serving. Choose foods with less than 5 percent of the Daily Value of sodium. Generally, foods with less than 300 mg of sodium per serving fit into this eating plan.  To find whole grains, look for the word "whole" as the first word in the ingredient list. Shopping  Buy products labeled as "low-sodium" or "no salt added."  Buy fresh foods. Avoid canned foods and premade or frozen meals. Cooking  Avoid adding salt when cooking. Use salt-free seasonings or herbs instead of table salt or sea salt. Check with your health care provider or pharmacist before using salt substitutes.  Do not fry foods. Cook foods using healthy methods such as baking, boiling, grilling, and broiling instead.  Cook with heart-healthy oils, such as olive, canola, soybean, or sunflower oil. Meal planning   Eat a balanced diet that includes: ? 5 or more servings of fruits and vegetables each day. At each meal, try to fill half of your plate with fruits and vegetables. ? Up to 6-8 servings of whole grains each day. ? Less than 6 oz of lean meat, poultry, or fish each day. A 3-oz serving of meat is about the same size as a deck of cards. One egg equals 1 oz. ? 2 servings of low-fat dairy each day. ? A serving of nuts, seeds, or beans 5 times each week. ? Heart-healthy fats. Healthy fats called Omega-3 fatty acids are found in foods such as flaxseeds and coldwater fish, like sardines, salmon, and mackerel.  Limit how much you eat of the following: ? Canned or prepackaged foods. ? Food that is high in trans fat, such as fried foods. ? Food that is high in saturated fat, such as fatty meat. ? Sweets, desserts, sugary drinks, and other foods with added sugar. ? Full-fat dairy products.  Do not salt foods before eating.  Try to eat at least 2 vegetarian meals each week.  Eat more home-cooked food and less restaurant, buffet, and fast  food.  When eating at a restaurant, ask that your food be prepared with less salt or no salt, if possible. What foods are recommended? The items listed may not be a complete list. Talk with your dietitian about what dietary choices are best for you. Grains Whole-grain or whole-wheat bread. Whole-grain or whole-wheat pasta. Brown rice. [place]. Bulgur. Whole-grain and low-sodium cereals. Pita bread. Low-fat, low-sodium crackers. Whole-wheat flour tortillas. Vegetables Fresh or frozen vegetables (raw, steamed, roasted, or grilled). Low-sodium or reduced-sodium tomato and vegetable juice. Low-sodium or reduced-sodium tomato sauce and tomato paste. Low-sodium or reduced-sodium canned vegetables. Fruits All fresh, dried, or frozen fruit. Canned fruit in natural juice (without added sugar). Meat and other protein foods Skinless chicken or [place]. Ground chicken or [place]. Pork with fat trimmed off. Fish and seafood. Egg whites. Dried beans, peas, or lentils. Unsalted nuts, nut butters, and seeds. Unsalted canned beans. Lean cuts of beef with fat trimmed off. Low-sodium, lean deli meat. Dairy Low-fat (1%) or fat-free (skim) milk. Fat-free, low-fat, or reduced-fat cheeses. Nonfat, low-sodium ricotta or cottage cheese. Low-fat or nonfat yogurt. Low-fat, low-sodium cheese. Fats and oils Soft margarine without trans fats. Vegetable oil. Low-fat, reduced-fat, or light mayonnaise and salad dressings (reduced-sodium). Canola, safflower, olive, soybean, and sunflower oils. Avocado. Seasoning and other foods Herbs. Spices. Seasoning mixes without salt. Unsalted popcorn and pretzels. Fat-free sweets. What foods are not recommended? The items listed may not be a complete list. Talk with your dietitian about what dietary choices are best for you. Grains Baked goods made with fat, such as croissants, muffins, or some breads. Dry pasta or rice meal packs. Vegetables Creamed or fried vegetables. Vegetables  in a cheese sauce. Regular canned vegetables (not low-sodium or reduced-sodium). Regular canned tomato sauce and paste (not low-sodium or reduced-sodium). Regular tomato and vegetable juice (not low-sodium or reduced-sodium). Angie Fava. Olives. Fruits Canned fruit in  a light or heavy syrup. Fried fruit. Fruit in cream or butter sauce. Meat and other protein foods Fatty cuts of meat. Ribs. Fried meat. Berniece Salines. Sausage. Bologna and other processed lunch meats. Salami. Fatback. Hotdogs. Bratwurst. Salted nuts and seeds. Canned beans with added salt. Canned or smoked fish. Whole eggs or egg yolks. Chicken or [place] with skin. Dairy Whole or 2% milk, cream, and half-and-half. Whole or full-fat cream cheese. Whole-fat or sweetened yogurt. Full-fat cheese. Nondairy creamers. Whipped toppings. Processed cheese and cheese spreads. Fats and oils Butter. Stick margarine. Lard. Shortening. Ghee. Bacon fat. Tropical oils, such as coconut, palm kernel, or palm oil. Seasoning and other foods Salted popcorn and pretzels. Onion salt, garlic salt, seasoned salt, table salt, and sea salt. Worcestershire sauce. Tartar sauce. Barbecue sauce. Teriyaki sauce. Soy sauce, including reduced-sodium. Steak sauce. Canned and packaged gravies. Fish sauce. Oyster sauce. Cocktail sauce. Horseradish that you find on the shelf. Ketchup. Mustard. Meat flavorings and tenderizers. Bouillon cubes. Hot sauce and Tabasco sauce. Premade or packaged marinades. Premade or packaged taco seasonings. Relishes. Regular salad dressings. Where to find more information:  National Heart, Lung, and [place]: https://wilson-eaton.com/  American Heart Association: www.heart.org Summary  The DASH eating plan is a healthy eating plan that has been shown to reduce high blood pressure (hypertension). It may also reduce your risk for type 2 diabetes, heart disease, and stroke.  With the DASH eating plan, you should limit salt (sodium) intake to 2,300 mg a  day. If you have hypertension, you may need to reduce your sodium intake to 1,500 mg a day.  When on the DASH eating plan, aim to eat more fresh fruits and vegetables, whole grains, lean proteins, low-fat dairy, and heart-healthy fats.  Work with your health care provider or diet and nutrition specialist (dietitian) to adjust your eating plan to your individual calorie needs. This information is not intended to replace advice given to you by your health care provider. Make sure you discuss any questions you have with your health care provider. Document Released: 08/30/2011 Document Revised: 09/03/2016 Document Reviewed: 09/03/2016 Elsevier Interactive Patient Education  [employer].

## 2018-09-01 NOTE — Assessment & Plan Note (Addendum)
BMI is > 35 with HTN, gallbladder disease, venous insufficiency, high cholesterolemia, possible sleep apnea, and prediabetes

## 2018-09-01 NOTE — Addendum Note (Signed)
Addended by: Trevaun Rendleman, Satira Anis on: 09/01/2018 04:25 PM   Modules accepted: Orders

## 2018-09-01 NOTE — Assessment & Plan Note (Signed)
Stable, chronic, no aura

## 2018-09-01 NOTE — Progress Notes (Addendum)
BP 128/80   Pulse 99   Temp 97.6 F (36.4 C) (Oral)   Ht 5' 4.5" (1.638 m)   Wt 226 lb 12.8 oz (102.9 kg)   LMP 07/12/2016   SpO2 97%   BMI 38.33 kg/m    Subjective:    Patient ID: Nicole Osborn, female    DOB: April 01, 1971, 47 y.o.   MRN: [id number]  HPI: Nicole Osborn is a 47 y.o. female  Chief Complaint  Patient presents with  . Referral    sleep study asap to get weight loss surgery  . Cyst    under skin on right arm  . Vaginal Bleeding    post menopausal  . Cough    4 weeks    HPI Patient is here for a sleep study referral She saw the bariatric surgery NP and they said she qualified for the surgery, but the insurance company says they won't pay for it Other qualifying reasons include diabetes, sleep apnea, HTN The vein doctor diagnosed her with venous insuffiency; abnormal reflux in the great saphenous vein at the knee, prox calf; abnormal reflux common femoral on the left Had gallbladder removed this year High cholesterol; she is trying to watch her diet better; I asked about medicine Waking up with headaches / migraines; snoring; daytime somnolence Uncle has sleep apnea; mother had bariatric surgery; aunt had type 2 DM Patient's last A1c was 5.8 Her knees and hips hurt sometimes too Looked at 2015 and 2017 xrays of ankles She takes tums occasionally She has urinary incontinence; worked with pelvic floor physical therapist  Sleep disturbance Obstructive Sleep Apnea Screening The patient was screened with the STOP-BANG questionnaire. >3 positive responses is considered a positive screen  Snoring:  yes Tiredness:  yes Observed Apnea:  No, but husband sleeps like a log Pressure (HTN):  142/70 on 07/24/18, 114/90 on 07/24/18, 150/83 on 07/15/18 yes BMI >35:  yes Age > 50:  no Neck >17":  14" today Gender (female):  female  Total:   4/8    Different issues: She has had a cough for 4 weeks; some  phlegm Soft mass on the right arm; been there for a long time; not growing, not painful She had a normal period in October; previous period had been more than two years prior; looked at [facility] and was postmenopasual in 2017    Depression screen [facility] 2/9 09/01/2018 09/01/2018 07/24/2018 04/15/2018 04/15/2018  Decreased Interest 0 0 0 1 1  Down, Depressed, Hopeless 0 0 0 1 1  PHQ - 2 Score 0 0 0 2 2  Altered sleeping 0 - - 1 -  Tired, decreased energy 0 - - 1 -  Change in appetite 0 - - 1 -  Feeling bad or failure about yourself  0 - - 1 -  Trouble concentrating 0 - - 1 -  Moving slowly or fidgety/restless 0 - - 0 -  Suicidal thoughts 0 - - 0 -  PHQ-9 Score 0 - - 7 -  Difficult doing work/chores Not difficult at all - - Somewhat difficult -   Fall Risk  09/01/2018 07/24/2018 04/15/2018 04/15/2018 02/06/2018  Falls in the past year? 0 No No No No  Number falls in past yr: - - - - -  Injury with Fall? - - - - -  Comment - - - - -    Relevant past medical, surgical, family and social history reviewed Past Medical History:  Diagnosis Date  . Anxiety   .  Arthritis   . Cholecystitis with cholelithiasis 09/19/2017  . Depression   . Elevated blood pressure reading    no meds  . GERD (gastroesophageal reflux disease)    occ  . Heartburn   . History of kidney stones    h/o  . Hyperlipidemia   . Hypothyroidism   . Migraine 09/23/2017  . Migraines    without aura  . Obesity   . Thyroid disease   . Varicose veins of both lower extremities 09/06/2017   Past Surgical History:  Procedure Laterality Date  . CHOLECYSTECTOMY N/A 10/08/2017   Procedure: LAPAROSCOPIC CHOLECYSTECTOMY;  Surgeon: Florene Glen, MD;  Location: [facility] ORS;  Service: General;  Laterality: N/A;  . Removal of extra tooth     age 82   Family History  Problem Relation Age of Onset  . Depression Mother   . Arthritis Mother   . Stroke Father   . Hypertension Father   . Breast cancer Paternal Aunt 82  . Emphysema  Maternal Grandmother   . Emphysema Maternal Grandfather   . Dementia Paternal Grandmother   . Asthma Son   . Eczema Son   . Kidney disease Neg Hx   . Bladder Cancer Neg Hx    Social History   Tobacco Use  . Smoking status: Never Smoker  . Smokeless tobacco: Never Used  Substance Use Topics  . Alcohol use: No  . Drug use: No     Office Visit from 09/01/2018 in [facility]  AUDIT-C Score  0      Interim medical history since last visit reviewed. Allergies and medications reviewed  Review of Systems  Constitutional: Negative for fever (none in the last 2 weeks, but did have some 4 weeks ago).  HENT: Positive for postnasal drip and sore throat (just irritated at night).   Respiratory: Positive for cough (started out as bad cough, phlegm production).   Genitourinary: Positive for vaginal bleeding. Negative for pelvic pain and vaginal pain.   Per HPI unless specifically indicated above     Objective:    BP 128/80   Pulse 99   Temp 97.6 F (36.4 C) (Oral)   Ht 5' 4.5" (1.638 m)   Wt 226 lb 12.8 oz (102.9 kg)   LMP 07/12/2016   SpO2 97%   BMI 38.33 kg/m   Wt Readings from Last 3 Encounters:  09/01/18 226 lb 12.8 oz (102.9 kg)  08/15/18 225 lb (102.1 kg)  07/24/18 230 lb 6.4 oz (104.5 kg)    Physical Exam  Constitutional: She appears well-developed and well-nourished.  HENT:  Mouth/Throat: Mucous membranes are normal. No posterior oropharyngeal edema.  Mallampati class I  Eyes: EOM are normal. No scleral icterus.  Cardiovascular: Normal rate and regular rhythm.  Pulmonary/Chest: Effort normal. She has decreased breath sounds (just upper right side) in the right upper field. She has no wheezes. She has no rhonchi. She has no rales.  Musculoskeletal: She exhibits no edema (nonpitting).  Skin:     Soft mass on the radial RIGHT forearm consistent with lipoma; no overlying skin changes; no hard areas  Psychiatric: She has a normal mood and affect.  Her behavior is normal.    Results for orders placed or performed in visit on 07/24/18  Vitamin D (25 hydroxy)  Result Value Ref Range   Vit D, 25-Hydroxy 30 30 - 100 ng/mL  HgB A1c  Result Value Ref Range   Hgb A1c MFr Bld 5.8 (H) <5.7 % of  total Hgb   Mean Plasma Glucose 120 (calc)   eAG (mmol/L) 6.6 (calc)  B12  Result Value Ref Range   Vitamin B-12 449 200 - 1,100 pg/mL  TSH  Result Value Ref Range   TSH 2.31 mIU/L  COMPLETE METABOLIC PANEL WITH GFR  Result Value Ref Range   Glucose, Bld 85 65 - 139 mg/dL   BUN 15 7 - 25 mg/dL   Creat 0.78 0.50 - 1.10 mg/dL   GFR, Est Non African American 90 > OR = 60 mL/min/1.2m2   GFR, Est African American 104 > OR = 60 mL/min/1.38m2   BUN/Creatinine Ratio NOT APPLICABLE 6 - 22 (calc)   Sodium 142 135 - 146 mmol/L   Potassium 4.1 3.5 - 5.3 mmol/L   Chloride 106 98 - 110 mmol/L   CO2 27 20 - 32 mmol/L   Calcium 9.5 8.6 - 10.2 mg/dL   Total Protein 7.2 6.1 - 8.1 g/dL   Albumin 4.3 3.6 - 5.1 g/dL   Globulin 2.9 1.9 - 3.7 g/dL (calc)   AG Ratio 1.5 1.0 - 2.5 (calc)   Total Bilirubin 0.4 0.2 - 1.2 mg/dL   Alkaline phosphatase (APISO) 93 33 - 115 U/L   AST 14 10 - 35 U/L   ALT 11 6 - 29 U/L  CBC  Result Value Ref Range   WBC 6.2 3.8 - 10.8 Thousand/uL   RBC 4.47 3.80 - 5.10 Million/uL   Hemoglobin 13.5 11.7 - 15.5 g/dL   HCT 40.2 35.0 - 45.0 %   MCV 89.9 80.0 - 100.0 fL   MCH 30.2 27.0 - 33.0 pg   MCHC 33.6 32.0 - 36.0 g/dL   RDW 12.9 11.0 - 15.0 %   Platelets 274 140 - 400 Thousand/uL   MPV 10.8 7.5 - 12.5 fL  Fe+TIBC+Fer  Result Value Ref Range   Iron 99 40 - 190 mcg/dL   TIBC 373 250 - 450 mcg/dL (calc)   %SAT 27 16 - 45 % (calc)   Ferritin 46 16 - 232 ng/mL   Lab Results  Component Value Date   CHOL 213 (H) 05/09/2018   HDL 41 (L) 05/09/2018   LDLCALC 140 (H) 05/09/2018   TRIG 180 (H) 05/09/2018   CHOLHDL 5.2 (H) 05/09/2018   The 10-year ASCVD risk score Mikey Bussing DC Jr., et al., 2013) is: 1.7%   Values used to  calculate the score:     Age: 34 years     Sex: Female     Is Non-Hispanic African American: No     Diabetic: No     Tobacco smoker: No     Systolic Blood Pressure: 025 mmHg     Is BP treated: No     HDL Cholesterol: 41 mg/dL     Total Cholesterol: 213 mg/dL     Assessment & Plan:   Problem List Items Addressed This Visit      Cardiovascular and Mediastinum   Varicose veins of both lower extremities    With venous insufficieincy; significant weight loss would likely help this condition in my opinion due to the lack of muscular layers in the venous system and effect of adiposity compressing the veins      Migraine    Stable, chronic, no aura      Relevant Orders   Ambulatory referral to Pulmonology   Hypertension    Try to DASH guidelines; I believe that significant weight loss would help her blood pressure; elevated blood pressure over time  can increase the risk of stroke and heart attack      Relevant Orders   Ambulatory referral to Pulmonology     Other   Urinary incontinence in female    Possibly related to obesity; already did pelvic floor PT      Prediabetes    I believe significant weight loss would help the patient slow progression or even completely avoid the deveopment of future type 2 diabetes      Morbid obesity ([place])    BMI is > 35 with HTN, gallbladder disease, venous insufficiency, high cholesterolemia, possible sleep apnea, and prediabetes      Relevant Orders   Ambulatory referral to Pulmonology   Hyperlipidemia    Likely impacted by her obesity; I believe that significant weight loss would substantially improve her lipid profile, which may in turn prevent a heart attack or stroke in future; she would like to try TLC      History of gallbladder disease    Likely the result of obesity       Other Visit Diagnoses    Snoring    -  Primary   Relevant Orders   Ambulatory referral to Pulmonology   Screening for breast cancer       Relevant  Orders   MM 3D SCREEN BREAST BILATERAL   Cough       get chest xray today   Relevant Orders   DG Chest 2 View   Lipoma of right upper extremity       benign; watch and notify me if growing quickly or getting hard   Postmenopausal bleeding       Relevant Orders   US Pelvis Complete   US Transvaginal Non-OB       Follow up plan: No follow-ups on file.  An after-visit summary was printed and given to the patient at [facility] [place].  Please see the patient instructions which may contain other information and recommendations beyond what is mentioned above in the assessment and plan.  No orders of the defined types were placed in this encounter.   Orders Placed This Encounter  Procedures  . MM 3D SCREEN BREAST BILATERAL  . DG Chest 2 View  . US Pelvis Complete  . US Transvaginal Non-OB  . Ambulatory referral to Pulmonology   cc: Dr. Nicki Guadalajara

## 2018-09-01 NOTE — Assessment & Plan Note (Addendum)
With venous insufficieincy; significant weight loss would likely help this condition in my opinion due to the lack of muscular layers in the venous system and effect of adiposity compressing the veins

## 2018-09-01 NOTE — Assessment & Plan Note (Signed)
Try to DASH guidelines; I believe that significant weight loss would help her blood pressure; elevated blood pressure over time can increase the risk of stroke and heart attack

## 2018-09-02 ENCOUNTER — Telehealth: Payer: Self-pay

## 2018-09-02 NOTE — Telephone Encounter (Signed)
Copied from [place] [id number]. Topic: General - Other >> Sep 02, 2018  4:34 PM Yvette Rack wrote: Reason for CRM: Patient requests x-ray results. Patients requests call back to discuss results.

## 2018-09-02 NOTE — Telephone Encounter (Signed)
  IMPRESSION: No active cardiopulmonary disease.   Electronically Signed   By: Marijo Conception, M.D.   On: 09/02/2018 08:49  -----------------------------------------------  I left brief message, calling to let her know I'll release the CXR through MyChart; xray was negative; good news

## 2018-09-04 ENCOUNTER — Telehealth: Payer: Self-pay

## 2018-09-04 NOTE — Telephone Encounter (Signed)
Copied from [place] [id number]. Topic: General - Other >> Sep 04, 2018 11:12 AM Yvette Rack wrote: Reason for CRM: pt calling wanting to know if she can be referred to directly to a sleep study she doesn't want to go to the [facility] pulmonary for referral she also want to know if the referral was correct to a pulmonary doctor for a sleep study please call pt back at [phone]

## 2018-09-04 NOTE — Telephone Encounter (Signed)
Pt.notified

## 2018-09-04 NOTE — Telephone Encounter (Signed)
She needs to see the pulmonologist who is the sleep specialist

## 2018-09-05 ENCOUNTER — Ambulatory Visit
Admission: RE | Admit: 2018-09-05 | Discharge: 2018-09-05 | Disposition: A | Discharge status: Home / Self Care | Payer: BC Managed Care – PPO | Source: Ambulatory Visit | Attending: Family Medicine | Admitting: Family Medicine

## 2018-09-05 DIAGNOSIS — N950 Postmenopausal bleeding: Secondary | ICD-10-CM | POA: Diagnosis present

## 2018-09-06 ENCOUNTER — Other Ambulatory Visit: Payer: Self-pay | Admitting: Family Medicine

## 2018-09-06 NOTE — Telephone Encounter (Signed)
Lab Results  Component Value Date   TSH 2.31 07/24/2018   T4TOTAL 7.5 02/02/2016

## 2018-09-10 ENCOUNTER — Ambulatory Visit: Payer: BC Managed Care – PPO | Admitting: Gastroenterology

## 2018-10-01 ENCOUNTER — Telehealth: Payer: Self-pay | Admitting: Family Medicine

## 2018-10-01 ENCOUNTER — Other Ambulatory Visit: Payer: Self-pay | Admitting: Family Medicine

## 2018-10-01 NOTE — Telephone Encounter (Signed)
Already sent in today.

## 2018-10-01 NOTE — Telephone Encounter (Signed)
Copied from [place] [id number]. Topic: Quick Communication - Rx Refill/Question >> Oct 01, 2018  1:01 PM Bea Graff, NT wrote: Medication: SUMAtriptan (IMITREX) 100 MG tablet   Has the patient contacted their pharmacy? Yes.   (Agent: If no, request that the patient contact the pharmacy for the refill.) (Agent: If yes, when and what did the pharmacy advise?)  Preferred Pharmacy (with phone number or street name): [id number] - [place], [address] [phone] (Phone) [phone] (Fax)    Agent: Please be advised that RX refills may take up to 3 business days. We ask that you follow-up with your pharmacy.

## 2018-10-01 NOTE — Telephone Encounter (Signed)
BP Readings from Last 3 Encounters:  09/01/18 128/80  08/15/18 126/79  08/01/18 134/82

## 2018-10-13 ENCOUNTER — Encounter (INDEPENDENT_AMBULATORY_CARE_PROVIDER_SITE_OTHER): Payer: Self-pay

## 2018-10-17 ENCOUNTER — Ambulatory Visit: Payer: Self-pay

## 2018-10-17 NOTE — Telephone Encounter (Signed)
Pt. Reports she started having numbness to her left side - face,tongue, and arm. Has tingling as well. 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 Has right sided chest "pressure and discomfort." Volunteers she has been under "a lot of stress at home and at work." Is moving all extremities without difficulty, no slurred speech or difficulty swallowing. Instructed pt. To have someone take her to the ED for evaluation. Verbalizes understanding.  Reason for Disposition . Patient sounds very sick or weak to the triager  Answer Assessment - Initial Assessment Questions 1. SYMPTOM: "What is the main symptom you are concerned about?" (e.g., weakness, numbness)     Numbness and tingling left side - face, tongue, arm 2. ONSET: "When did this start?" (minutes, hours, days; while sleeping)     0830 this morning 3. LAST NORMAL: "When was the last time you were normal (no symptoms)?"     Felt ok last night 4. PATTERN "Does this come and go, or has it been constant since it started?"  "Is it present now?"     Constant 5. CARDIAC SYMPTOMS: "Have you had any of the following symptoms: chest pain, difficulty breathing, palpitations?"     "A little chest." 6. NEUROLOGIC SYMPTOMS: "Have you had any of the following symptoms: headache, dizziness, vision loss, double vision, changes in speech, unsteady on your feet?"     "Nervous feeling."  Having a lot of stress 7. OTHER SYMPTOMS: "Do you have any other symptoms?"     No 8. PREGNANCY: "Is there any chance you are pregnant?" "When was your last menstrual period?"     Yes, but irregular  Protocols used: NEUROLOGIC DEFICIT-A-AH

## 2018-10-22 ENCOUNTER — Other Ambulatory Visit: Payer: Self-pay | Admitting: Family Medicine

## 2018-10-22 DIAGNOSIS — F330 Major depressive disorder, recurrent, mild: Secondary | ICD-10-CM

## 2018-11-27 ENCOUNTER — Encounter: Payer: Self-pay | Admitting: Family Medicine

## 2018-11-27 ENCOUNTER — Ambulatory Visit: Payer: BC Managed Care – PPO | Admitting: Family Medicine

## 2018-11-27 DIAGNOSIS — G43009 Migraine without aura, not intractable, without status migrainosus: Secondary | ICD-10-CM | POA: Diagnosis not present

## 2018-11-27 DIAGNOSIS — R7303 Prediabetes: Secondary | ICD-10-CM

## 2018-11-27 DIAGNOSIS — M67442 Ganglion, left hand: Secondary | ICD-10-CM

## 2018-11-27 DIAGNOSIS — Z8719 Personal history of other diseases of the digestive system: Secondary | ICD-10-CM | POA: Diagnosis not present

## 2018-11-27 DIAGNOSIS — I872 Venous insufficiency (chronic) (peripheral): Secondary | ICD-10-CM | POA: Diagnosis not present

## 2018-11-27 DIAGNOSIS — E034 Atrophy of thyroid (acquired): Secondary | ICD-10-CM

## 2018-11-27 DIAGNOSIS — E782 Mixed hyperlipidemia: Secondary | ICD-10-CM

## 2018-11-27 DIAGNOSIS — R5383 Other fatigue: Secondary | ICD-10-CM

## 2018-11-27 DIAGNOSIS — M7989 Other specified soft tissue disorders: Secondary | ICD-10-CM

## 2018-11-27 MED ORDER — HYDROCHLOROTHIAZIDE 12.5 MG PO CAPS: 12.5000 mg | ORAL_CAPSULE | Freq: Every day | ORAL | Status: DC

## 2018-11-27 MED ORDER — LISINOPRIL 10 MG PO TABS: 10.0000 mg | ORAL_TABLET | Freq: Every day | ORAL | 0 refills | Status: DC

## 2018-11-27 NOTE — Assessment & Plan Note (Signed)
Likely related to obesity

## 2018-11-27 NOTE — Assessment & Plan Note (Signed)
Likely relate dot obesity and may be improved with significant weight loss

## 2018-11-27 NOTE — Assessment & Plan Note (Signed)
Check TSH, as patient reports low temps and weight gain and feeling tired

## 2018-11-27 NOTE — Assessment & Plan Note (Signed)
BMI > 35, HTN, GERD, which meets criteria for morbid obesity; patient would benefit in my opinion to have bariatric surgery; prevention of complications from obesity such as breast cancer, diabetes, stroke, etc are so important and could be prevented with significant weight loss

## 2018-11-27 NOTE — Assessment & Plan Note (Addendum)
No aura; caution given about triptan use and risk of stroke with hypertension; consider an injectable with neurologist for prevention since she has had side effects with other preventive medicines

## 2018-11-27 NOTE — Progress Notes (Signed)
BP 122/88   Pulse 80   Temp (!) 97.5 F (36.4 C)   Ht 5\' 5"  (1.651 m)   Wt 228 lb (103.4 kg)   LMP 07/12/2016   SpO2 95%   BMI 37.94 kg/m    Subjective:    Patient ID: Nicole Osborn, female    DOB: August 31, 1971, 48 y.o.   MRN: [id number]  HPI: Nicole Osborn is a 48 y.o. female  Chief Complaint  Patient presents with  . Follow-up  . Hand Pain    Left thumb, onset about 2 weeks ago  . Foot Swelling    Left with pain, onset about 6 weeks ago    HPI Patient is here for follow-up as well as two new complaints  Her blood pressure was noted to be high before I even walked in the door, so CMA rechecked this at my request; recheck was better, 122/88; high at work one day and numb on her left side, they called the paramedic, 150/100; she called here and was told to go to the ER but she did not; went to walk-in clinic and it was still high; they put her on medicine; no more numbness; this was a month ago or so; 157/97 at the [facility]; high blood pressure runs in the family; father had a stroke 6-7 years ago; he is doing okay; note from Jan 26th reviewed; no side effects, no cramps and no palpitations  She has been sleeping a lot on the weekends  She has a spot on her thumb and it hurts when she presses it and there is something under the skin; hurting for two weeks; visible swelling; had her thumb sticking out and caught in door jam; sore there She has a place on her foot, feels like a sore knot; 6 days duration; no iinjury recalled She needs refills of her medicines for other things  She does through the whole prescription of sumatriptan every month; she is not sure if weather or pressure related; Dr. Manuella Ghazi neurologist told her these were truly migraines; no aura, never had an aura; did have one in which she vomited; just pain on side, numbness on one side; Dr. Manuella Ghazi knows about the numbness; she talked about even going on disability for her migraines; she has tried preventive  medicine for migraines over the years; she started back on magnesium supplementation  Obesity; she is interested in bariatric surgery; she does not want to go to [facility]  She has situational stuff; no SI/HI; she is on cymbalta; does not want to touch her medicine right now; she is not seeing a counselor, "I don't have money or time"  Depression screen [facility] 2/9 11/27/2018 09/01/2018 09/01/2018 07/24/2018 04/15/2018  Decreased Interest 1 0 0 0 1  Down, Depressed, Hopeless 1 0 0 0 1  PHQ - 2 Score 2 0 0 0 2  Altered sleeping 1 0 - - 1  Tired, decreased energy 2 0 - - 1  Change in appetite 2 0 - - 1  Feeling bad or failure about yourself  1 0 - - 1  Trouble concentrating 1 0 - - 1  Moving slowly or fidgety/restless 0 0 - - 0  Suicidal thoughts 0 0 - - 0  PHQ-9 Score 9 0 - - 7  Difficult doing work/chores Somewhat difficult Not difficult at all - - Somewhat difficult  MD note; mild depression; does not want referral; denies SI/HI  Fall Risk  11/27/2018 09/01/2018 07/24/2018 04/15/2018  04/15/2018  Falls in the past year? 0 0 No No No  Number falls in past yr: - - - - -  Injury with Fall? - - - - -  Comment - - - - -    Relevant past medical, surgical, family and social history reviewed Past Medical History:  Diagnosis Date  . Anxiety   . Arthritis   . Cholecystitis with cholelithiasis 09/19/2017  . Depression   . Elevated blood pressure reading    no meds  . GERD (gastroesophageal reflux disease)    occ  . Heartburn   . History of kidney stones    h/o  . Hyperlipidemia   . Hypothyroidism   . Migraine 09/23/2017  . Migraines    without aura  . Obesity   . Thyroid disease   . Varicose veins of both lower extremities 09/06/2017   Past Surgical History:  Procedure Laterality Date  . CHOLECYSTECTOMY N/A 10/08/2017   Procedure: LAPAROSCOPIC CHOLECYSTECTOMY;  Surgeon: Florene Glen, MD;  Location: [facility] ORS;  Service: General;  Laterality: N/A;  . Removal of extra tooth     age  81   Family History  Problem Relation Age of Onset  . Depression Mother   . Arthritis Mother   . Stroke Father   . Hypertension Father   . Breast cancer Paternal Aunt 75  . Emphysema Maternal Grandmother   . Emphysema Maternal Grandfather   . Dementia Paternal Grandmother   . Asthma Son   . Eczema Son   . Kidney disease Neg Hx   . Bladder Cancer Neg Hx    Social History   Tobacco Use  . Smoking status: Never Smoker  . Smokeless tobacco: Never Used  Substance Use Topics  . Alcohol use: No  . Drug use: No     Office Visit from 11/27/2018 in [facility]  AUDIT-C Score  0      Interim medical history since last visit reviewed. Allergies and medications reviewed  Review of Systems Per HPI unless specifically indicated above     Objective:    BP 122/88   Pulse 80   Temp (!) 97.5 F (36.4 C)   Ht 5\' 5"  (1.651 m)   Wt 228 lb (103.4 kg)   LMP 07/12/2016   SpO2 95%   BMI 37.94 kg/m   Wt Readings from Last 3 Encounters:  11/27/18 228 lb (103.4 kg)  09/01/18 226 lb 12.8 oz (102.9 kg)  08/15/18 225 lb (102.1 kg)    Physical Exam Constitutional:      Appearance: She is well-developed. She is obese.  Eyes:     General: No scleral icterus. Cardiovascular:     Rate and Rhythm: Normal rate and regular rhythm.  Pulmonary:     Effort: Pulmonary effort is normal.     Breath sounds: Normal breath sounds.  Musculoskeletal:       Hands:       Feet:     Comments: Soft swelling base of LEFT thumb, mildly tender; soft swelling and palpable knot lateral LEFT foot; no overlying skin changes or bruises  Neurological:     Mental Status: She is alert.     Comments: Decreased sensation to monofilament testing LEFT thumb only; other digits intact  Psychiatric:        Behavior: Behavior normal.     Results for orders placed or performed in visit on 07/24/18  Vitamin D (25 hydroxy)  Result Value Ref Range   Vit  D, 25-Hydroxy 30 30 - 100 ng/mL  HgB A1c    Result Value Ref Range   Hgb A1c MFr Bld 5.8 (H) <5.7 % of total Hgb   Mean Plasma Glucose 120 (calc)   eAG (mmol/L) 6.6 (calc)  B12  Result Value Ref Range   Vitamin B-12 449 200 - 1,100 pg/mL  TSH  Result Value Ref Range   TSH 2.31 mIU/L  COMPLETE METABOLIC PANEL WITH GFR  Result Value Ref Range   Glucose, Bld 85 65 - 139 mg/dL   BUN 15 7 - 25 mg/dL   Creat 0.78 0.50 - 1.10 mg/dL   GFR, Est Non African American 90 > OR = 60 mL/min/1.59m2   GFR, Est African American 104 > OR = 60 mL/min/1.69m2   BUN/Creatinine Ratio NOT APPLICABLE 6 - 22 (calc)   Sodium 142 135 - 146 mmol/L   Potassium 4.1 3.5 - 5.3 mmol/L   Chloride 106 98 - 110 mmol/L   CO2 27 20 - 32 mmol/L   Calcium 9.5 8.6 - 10.2 mg/dL   Total Protein 7.2 6.1 - 8.1 g/dL   Albumin 4.3 3.6 - 5.1 g/dL   Globulin 2.9 1.9 - 3.7 g/dL (calc)   AG Ratio 1.5 1.0 - 2.5 (calc)   Total Bilirubin 0.4 0.2 - 1.2 mg/dL   Alkaline phosphatase (APISO) 93 33 - 115 U/L   AST 14 10 - 35 U/L   ALT 11 6 - 29 U/L  CBC  Result Value Ref Range   WBC 6.2 3.8 - 10.8 Thousand/uL   RBC 4.47 3.80 - 5.10 Million/uL   Hemoglobin 13.5 11.7 - 15.5 g/dL   HCT 40.2 35.0 - 45.0 %   MCV 89.9 80.0 - 100.0 fL   MCH 30.2 27.0 - 33.0 pg   MCHC 33.6 32.0 - 36.0 g/dL   RDW 12.9 11.0 - 15.0 %   Platelets 274 140 - 400 Thousand/uL   MPV 10.8 7.5 - 12.5 fL  Fe+TIBC+Fer  Result Value Ref Range   Iron 99 40 - 190 mcg/dL   TIBC 373 250 - 450 mcg/dL (calc)   %SAT 27 16 - 45 % (calc)   Ferritin 46 16 - 232 ng/mL      Assessment & Plan:   Problem List Items Addressed This Visit      Cardiovascular and Mediastinum   Migraine    No aura; caution given about triptan use and risk of stroke with hypertension; consider an injectable with neurologist for prevention since she has had side effects with other preventive medicines      Relevant Medications   hydrochlorothiazide (MICROZIDE) 12.5 MG capsule   lisinopril (PRINIVIL,ZESTRIL) 10 MG tablet   Chronic  venous insufficiency    Likely relate dot obesity and may be improved with significant weight loss      Relevant Medications   hydrochlorothiazide (MICROZIDE) 12.5 MG capsule   lisinopril (PRINIVIL,ZESTRIL) 10 MG tablet     Endocrine   Hypothyroidism, unspecified    Check TSH, as patient reports low temps and weight gain and feeling tired      Relevant Orders   TSH   T4, free     Other   Prediabetes   Relevant Orders   BASIC METABOLIC PANEL WITH GFR   Hyperlipidemia   Relevant Medications   hydrochlorothiazide (MICROZIDE) 12.5 MG capsule   lisinopril (PRINIVIL,ZESTRIL) 10 MG tablet   Other Relevant Orders   Lipid panel   Morbid obesity ([place]) - Primary  BMI > 35, HTN, GERD, which meets criteria for morbid obesity; patient would benefit in my opinion to have bariatric surgery; prevention of complications from obesity such as breast cancer, diabetes, stroke, etc are so important and could be prevented with significant weight loss      Relevant Orders   Amb Referral to Bariatric Surgery   History of gallbladder disease    Likely related to obesity       Other Visit Diagnoses    Ganglion cyst of finger of left hand       refer to ortho   Relevant Orders   Ambulatory referral to Orthopedic Surgery   Swelling of left foot       ddx includes ganglion cyst, lipoma, tendon sheath tumor (?), other pathology; refer to ortho   Relevant Orders   Ambulatory referral to Orthopedic Surgery   Other fatigue       check labs today   Relevant Orders   CBC   BASIC METABOLIC PANEL WITH GFR       Follow up plan: Return in about 1 week (around 12/04/2018) for follow-up visit with Dr. Sanda Klein.  An after-visit summary was printed and given to the patient at [place].  Please see the patient instructions which may contain other information and recommendations beyond what is mentioned above in the assessment and plan.  Meds ordered this encounter  Medications  . hydrochlorothiazide  (MICROZIDE) 12.5 MG capsule    Sig: Take 1 capsule (12.5 mg total) by mouth daily.  Marland Kitchen lisinopril (PRINIVIL,ZESTRIL) 10 MG tablet    Sig: Take 1 tablet (10 mg total) by mouth daily.    Dispense:  30 tablet    Refill:  0    Orders Placed This Encounter  Procedures  . Lipid panel  . CBC  . TSH  . T4, free  . BASIC METABOLIC PANEL WITH GFR  . Amb Referral to Bariatric Surgery  . Ambulatory referral to Orthopedic Surgery

## 2018-11-27 NOTE — Patient Instructions (Addendum)
I advise that you do not take any sumatriptan if you have uncontrolled hypertension because of the risk of stroke Please call Dr. Manuella Ghazi for your migraines and medicines and strategies those Talk with him about an injectable type of medicine that is now available  Add lisinopril for blood pressure Continue the hctz for blood pressure Return in 7-10 days for recheck here with Dr. Sanda Klein and labs  I'm going to advise that you stop Advil, Motrin, ibuprofen If you need something for aches or pains, try to use Tylenol (acetaminophen) instead of non-steroidals (which include Aleve, ibuprofen, Advil, Motrin, and naproxen); non-steroidals can cause long-term kidney damage and hypertension  Try to follow the DASH guidelines (DASH stands for Dietary Approaches to Stop Hypertension). Try to limit the sodium in your diet to no more than 1,500mg  of sodium per day. Certainly try to not exceed 2,000 mg per day at the very most. Do not add salt when cooking or at the table.  Check the sodium amount on labels when shopping, and choose items lower in sodium when given a choice. Avoid or limit foods that already contain a lot of sodium. Eat a diet rich in fruits and vegetables and whole grains, and try to lose weight if overweight or obese  Check out the information at familydoctor.org entitled "Nutrition for Weight Loss: What You Need to Know about Fad Diets" Try to lose between 1-2 pounds per week by taking in fewer calories and burning off more calories You can succeed by limiting portions, limiting foods dense in calories and fat, becoming more active, and drinking 8 glasses of water a day (64 ounces) Don't skip meals, especially breakfast, as skipping meals may alter your metabolism Do not use over-the-counter weight loss pills or gimmicks that claim rapid weight loss A healthy BMI (or body mass index) is between 18.5 and 24.9 You can calculate your ideal BMI at the [place] website  ClubMonetize.fr   DASH Eating Plan DASH stands for "Dietary Approaches to Stop Hypertension." The DASH eating plan is a healthy eating plan that has been shown to reduce high blood pressure (hypertension). It may also reduce your risk for type 2 diabetes, heart disease, and stroke. The DASH eating plan may also help with weight loss. What are tips for following this plan?  General guidelines  Avoid eating more than 2,300 mg (milligrams) of salt (sodium) a day. If you have hypertension, you may need to reduce your sodium intake to 1,500 mg a day.  Limit alcohol intake to no more than 1 drink a day for nonpregnant women and 2 drinks a day for men. One drink equals 12 oz of beer, 5 oz of wine, or 1 oz of hard liquor.  Work with your health care provider to maintain a healthy body weight or to lose weight. Ask what an ideal weight is for you.  Get at least 30 minutes of exercise that causes your heart to beat faster (aerobic exercise) most days of the week. Activities may include walking, swimming, or biking.  Work with your health care provider or diet and nutrition specialist (dietitian) to adjust your eating plan to your individual calorie needs. Reading food labels   Check food labels for the amount of sodium per serving. Choose foods with less than 5 percent of the Daily Value of sodium. Generally, foods with less than 300 mg of sodium per serving fit into this eating plan.  To find whole grains, look for the word "whole" as the first  word in the ingredient list. Shopping  Buy products labeled as "low-sodium" or "no salt added."  Buy fresh foods. Avoid canned foods and premade or frozen meals. Cooking  Avoid adding salt when cooking. Use salt-free seasonings or herbs instead of table salt or sea salt. Check with your health care provider or pharmacist before using salt substitutes.  Do not fry foods. Cook foods using healthy  methods such as baking, boiling, grilling, and broiling instead.  Cook with heart-healthy oils, such as olive, canola, soybean, or sunflower oil. Meal planning  Eat a balanced diet that includes: ? 5 or more servings of fruits and vegetables each day. At each meal, try to fill half of your plate with fruits and vegetables. ? Up to 6-8 servings of whole grains each day. ? Less than 6 oz of lean meat, poultry, or fish each day. A 3-oz serving of meat is about the same size as a deck of cards. One egg equals 1 oz. ? 2 servings of low-fat dairy each day. ? A serving of nuts, seeds, or beans 5 times each week. ? Heart-healthy fats. Healthy fats called Omega-3 fatty acids are found in foods such as flaxseeds and coldwater fish, like sardines, salmon, and mackerel.  Limit how much you eat of the following: ? Canned or prepackaged foods. ? Food that is high in trans fat, such as fried foods. ? Food that is high in saturated fat, such as fatty meat. ? Sweets, desserts, sugary drinks, and other foods with added sugar. ? Full-fat dairy products.  Do not salt foods before eating.  Try to eat at least 2 vegetarian meals each week.  Eat more home-cooked food and less restaurant, buffet, and fast food.  When eating at a restaurant, ask that your food be prepared with less salt or no salt, if possible. What foods are recommended? The items listed may not be a complete list. Talk with your dietitian about what dietary choices are best for you. Grains Whole-grain or whole-wheat bread. Whole-grain or whole-wheat pasta. Brown rice. [place]. Bulgur. Whole-grain and low-sodium cereals. Pita bread. Low-fat, low-sodium crackers. Whole-wheat flour tortillas. Vegetables Fresh or frozen vegetables (raw, steamed, roasted, or grilled). Low-sodium or reduced-sodium tomato and vegetable juice. Low-sodium or reduced-sodium tomato sauce and tomato paste. Low-sodium or reduced-sodium canned  vegetables. Fruits All fresh, dried, or frozen fruit. Canned fruit in natural juice (without added sugar). Meat and other protein foods Skinless chicken or [place]. Ground chicken or [place]. Pork with fat trimmed off. Fish and seafood. Egg whites. Dried beans, peas, or lentils. Unsalted nuts, nut butters, and seeds. Unsalted canned beans. Lean cuts of beef with fat trimmed off. Low-sodium, lean deli meat. Dairy Low-fat (1%) or fat-free (skim) milk. Fat-free, low-fat, or reduced-fat cheeses. Nonfat, low-sodium ricotta or cottage cheese. Low-fat or nonfat yogurt. Low-fat, low-sodium cheese. Fats and oils Soft margarine without trans fats. Vegetable oil. Low-fat, reduced-fat, or light mayonnaise and salad dressings (reduced-sodium). Canola, safflower, olive, soybean, and sunflower oils. Avocado. Seasoning and other foods Herbs. Spices. Seasoning mixes without salt. Unsalted popcorn and pretzels. Fat-free sweets. What foods are not recommended? The items listed may not be a complete list. Talk with your dietitian about what dietary choices are best for you. Grains Baked goods made with fat, such as croissants, muffins, or some breads. Dry pasta or rice meal packs. Vegetables Creamed or fried vegetables. Vegetables in a cheese sauce. Regular canned vegetables (not low-sodium or reduced-sodium). Regular canned tomato sauce and paste (not low-sodium or  reduced-sodium). Regular tomato and vegetable juice (not low-sodium or reduced-sodium). Angie Fava. Olives. Fruits Canned fruit in a light or heavy syrup. Fried fruit. Fruit in cream or butter sauce. Meat and other protein foods Fatty cuts of meat. Ribs. Fried meat. Berniece Salines. Sausage. Bologna and other processed lunch meats. Salami. Fatback. Hotdogs. Bratwurst. Salted nuts and seeds. Canned beans with added salt. Canned or smoked fish. Whole eggs or egg yolks. Chicken or [place] with skin. Dairy Whole or 2% milk, cream, and half-and-half. Whole or full-fat  cream cheese. Whole-fat or sweetened yogurt. Full-fat cheese. Nondairy creamers. Whipped toppings. Processed cheese and cheese spreads. Fats and oils Butter. Stick margarine. Lard. Shortening. Ghee. Bacon fat. Tropical oils, such as coconut, palm kernel, or palm oil. Seasoning and other foods Salted popcorn and pretzels. Onion salt, garlic salt, seasoned salt, table salt, and sea salt. Worcestershire sauce. Tartar sauce. Barbecue sauce. Teriyaki sauce. Soy sauce, including reduced-sodium. Steak sauce. Canned and packaged gravies. Fish sauce. Oyster sauce. Cocktail sauce. Horseradish that you find on the shelf. Ketchup. Mustard. Meat flavorings and tenderizers. Bouillon cubes. Hot sauce and Tabasco sauce. Premade or packaged marinades. Premade or packaged taco seasonings. Relishes. Regular salad dressings. Where to find more information:  National Heart, Lung, and [place]: https://wilson-eaton.com/  American Heart Association: www.heart.org Summary  The DASH eating plan is a healthy eating plan that has been shown to reduce high blood pressure (hypertension). It may also reduce your risk for type 2 diabetes, heart disease, and stroke.  With the DASH eating plan, you should limit salt (sodium) intake to 2,300 mg a day. If you have hypertension, you may need to reduce your sodium intake to 1,500 mg a day.  When on the DASH eating plan, aim to eat more fresh fruits and vegetables, whole grains, lean proteins, low-fat dairy, and heart-healthy fats.  Work with your health care provider or diet and nutrition specialist (dietitian) to adjust your eating plan to your individual calorie needs. This information is not intended to replace advice given to you by your health care provider. Make sure you discuss any questions you have with your health care provider. Document Released: 08/30/2011 Document Revised: 09/03/2016 Document Reviewed: 09/03/2016 Elsevier Interactive Patient Education  2019 Elsevier  Inc.   Obesity, Adult Obesity is the condition of having too much total body fat. Being overweight or obese means that your weight is greater than what is considered healthy for your body size. Obesity is determined by a measurement called BMI. BMI is an estimate of body fat and is calculated from height and weight. For adults, a BMI of 30 or higher is considered obese. Obesity can eventually lead to other health concerns and major illnesses, including:  Stroke.  Coronary artery disease (CAD).  Type 2 diabetes.  Some types of cancer, including cancers of the colon, breast, uterus, and gallbladder.  Osteoarthritis.  High blood pressure (hypertension).  High cholesterol.  Sleep apnea.  Gallbladder stones.  Infertility problems. What are the causes? The main cause of obesity is taking in (consuming) more calories than your body uses for energy. Other factors that contribute to this condition may include:  Being born with genes that make you more likely to become obese.  Having a medical condition that causes obesity. These conditions include: ? Hypothyroidism. ? Polycystic ovarian syndrome (PCOS). ? Binge-eating disorder. ? Cushing syndrome.  Taking certain medicines, such as steroids, antidepressants, and seizure medicines.  Not being physically active (sedentary lifestyle).  Living where there are limited places to exercise  safely or buy healthy foods.  Not getting enough sleep. What increases the risk? The following factors may increase your risk of this condition:  Having a family history of obesity.  Being a woman of African-American descent.  Being a man of Hispanic descent. What are the signs or symptoms? Having excessive body fat is the main symptom of this condition. How is this diagnosed? This condition may be diagnosed based on:  Your symptoms.  Your medical history.  A physical exam. Your health care provider may measure: ? Your BMI. If you are  an adult with a BMI between 25 and less than 30, you are considered overweight. If you are an adult with a BMI of 30 or higher, you are considered obese. ? The distances around your hips and your waist (circumferences). These may be compared to each other to help diagnose your condition. ? Your skinfold thickness. Your health care provider may gently pinch a fold of your skin and measure it. How is this treated? Treatment for this condition often includes changing your lifestyle. Treatment may include some or all of the following:  Dietary changes. Work with your health care provider and a dietitian to set a weight-loss goal that is healthy and reasonable for you. Dietary changes may include eating: ? Smaller portions. A portion size is the amount of a particular food that is healthy for you to eat at one time. This varies from person to person. ? Low-calorie or low-fat options. ? More whole grains, fruits, and vegetables.  Regular physical activity. This may include aerobic activity (cardio) and strength training.  Medicine to help you lose weight. Your health care provider may prescribe medicine if you are unable to lose 1 pound a week after 6 weeks of eating more healthily and doing more physical activity.  Surgery. Surgical options may include gastric banding and gastric bypass. Surgery may be done if: ? Other treatments have not helped to improve your condition. ? You have a BMI of 40 or higher. ? You have life-threatening health problems related to obesity. Follow these instructions at home:  Eating and drinking   Follow recommendations from your health care provider about what you eat and drink. Your health care provider may advise you to: ? Limit fast foods, sweets, and processed snack foods. ? Choose low-fat options, such as low-fat milk instead of whole milk. ? Eat 5 or more servings of fruits or vegetables every day. ? Eat at home more often. This gives you more control over  what you eat. ? Choose healthy foods when you eat out. ? Learn what a healthy portion size is. ? Keep low-fat snacks on hand. ? Avoid sugary drinks, such as soda, fruit juice, iced tea sweetened with sugar, and flavored milk. ? Eat a healthy breakfast.  Drink enough water to keep your urine clear or pale yellow.  Do not go without eating for long periods of time (do not fast) or follow a fad diet. Fasting and fad diets can be unhealthy and even dangerous. Physical Activity  Exercise regularly, as told by your health care provider. Ask your health care provider what types of exercise are safe for you and how often you should exercise.  Warm up and stretch before being active.  Cool down and stretch after being active.  Rest between periods of activity. Lifestyle  Limit the time that you spend in front of your TV, computer, or video game system.  Find ways to reward yourself that do not  involve food.  Limit alcohol intake to no more than 1 drink a day for nonpregnant women and 2 drinks a day for men. One drink equals 12 oz of beer, 5 oz of wine, or 1 oz of hard liquor. General instructions  Keep a weight loss journal to keep track of the food you eat and how much you exercise you get.  Take over-the-counter and prescription medicines only as told by your health care provider.  Take vitamins and supplements only as told by your health care provider.  Consider joining a support group. Your health care provider may be able to recommend a support group.  Keep all follow-up visits as told by your health care provider. This is important. Contact a health care provider if:  You are unable to meet your weight loss goal after 6 weeks of dietary and lifestyle changes. This information is not intended to replace advice given to you by your health care provider. Make sure you discuss any questions you have with your health care provider. Document Released: 10/18/2004 Document Revised:  02/13/2016 Document Reviewed: 06/29/2015 Elsevier Interactive Patient Education  2019 Elsevier Inc.  Preventing Unhealthy [employer], Adult Staying at a healthy weight is important to your overall health. When fat builds up in your body, you may become overweight or obese. Being overweight or obese increases your risk of developing certain health problems, such as heart disease, diabetes, sleeping problems, joint problems, and some types of cancer. Unhealthy weight gain is often the result of making unhealthy food choices or not getting enough exercise. You can make changes to your lifestyle to prevent obesity and stay as healthy as possible. What nutrition changes can be made?   Eat only as much as your body needs. To do this: ? Pay attention to signs that you are hungry or full. Stop eating as soon as you feel full. ? If you feel hungry, try drinking water first before eating. Drink enough water so your urine is clear or pale yellow. ? Eat smaller portions. Pay attention to portion sizes when eating out. ? Look at serving sizes on food labels. Most foods contain more than one serving per container. ? Eat the recommended number of calories for your gender and activity level. For most active people, a daily total of 2,000 calories is appropriate. If you are trying to lose weight or are not very active, you may need to eat fewer calories. Talk with your health care provider or a diet and nutrition specialist (dietitian) about how many calories you need each day.  Choose healthy foods, such as: ? Fruits and vegetables. At each meal, try to fill at least half of your plate with fruits and vegetables. ? Whole grains, such as whole-wheat bread, brown rice, and quinoa. ? Lean meats, such as chicken or fish. ? Other healthy proteins, such as beans, eggs, or tofu. ? Healthy fats, such as nuts, seeds, fatty fish, and olive oil. ? Low-fat or fat-free dairy products.  Check food labels, and avoid food  and drinks that: ? Are high in calories. ? Have added sugar. ? Are high in sodium. ? Have saturated fats or trans fats.  Cook foods in healthier ways, such as by baking, broiling, or grilling.  Make a meal plan for the week, and shop with a grocery list to help you stay on track with your purchases. Try to avoid going to the grocery store when you are hungry.  When grocery shopping, try to shop around the  outside of the store first, where the fresh foods are. Doing this helps you to avoid prepackaged foods, which can be high in sugar, salt (sodium), and fat. What lifestyle changes can be made?   Exercise for 30 or more minutes on 5 or more days each week. Exercising may include brisk walking, yard work, biking, running, swimming, and team sports like basketball and soccer. Ask your health care provider which exercises are safe for you.  Do muscle-strengthening activities, such as lifting weights or using resistance bands, on 2 or more days a week.  Do not use any products that contain nicotine or tobacco, such as cigarettes and e-cigarettes. If you need help quitting, ask your health care provider.  Limit alcohol intake to no more than 1 drink a day for nonpregnant women and 2 drinks a day for men. One drink equals 12 oz of beer, 5 oz of wine, or 1 oz of hard liquor.  Try to get 7-9 hours of sleep each night. What other changes can be made?  Keep a food and activity journal to keep track of: ? What you ate and how many calories you had. Remember to count the calories in sauces, dressings, and side dishes. ? Whether you were active, and what exercises you did. ? Your calorie, weight, and activity goals.  Check your weight regularly. Track any changes. If you notice you have gained weight, make changes to your diet or activity routine.  Avoid taking weight-loss medicines or supplements. Talk to your health care provider before starting any new medicine or supplement.  Talk to your  health care provider before trying any new diet or exercise plan. Why are these changes important? Eating healthy, staying active, and having healthy habits can help you to prevent obesity. Those changes also:  Help you manage stress and emotions.  Help you connect with friends and family.  Improve your self-esteem.  Improve your sleep.  Prevent long-term health problems. What can happen if changes are not made? Being obese or overweight can cause you to develop joint or bone problems, which can make it hard for you to stay active or do activities you enjoy. Being obese or overweight also puts stress on your heart and lungs and can lead to health problems like diabetes, heart disease, and some cancers. Where to find more information Talk with your health care provider or a dietitian about healthy eating and healthy lifestyle choices. You may also find information from:  U.S. Department of Agriculture, MyPlate: FormerBoss.no  American Heart Association: www.heart.org  Centers for Disease Control and Prevention: http://www.wolf.info/ Summary  Staying at a healthy weight is important to your overall health. It helps you to prevent certain diseases and health problems, such as heart disease, diabetes, joint problems, sleep disorders, and some types of cancer.  Being obese or overweight can cause you to develop joint or bone problems, which can make it hard for you to stay active or do activities you enjoy.  You can prevent unhealthy weight gain by eating a healthy diet, exercising regularly, not smoking, limiting alcohol, and getting enough sleep.  Talk with your health care provider or a dietitian for guidance about healthy eating and healthy lifestyle choices. This information is not intended to replace advice given to you by your health care provider. Make sure you discuss any questions you have with your health care provider. Document Released: 09/11/2016 Document Revised: 06/21/2017  Document Reviewed: 10/17/2016 Elsevier Interactive Patient Education  2019 [employer].

## 2018-11-27 NOTE — Progress Notes (Signed)
Greetings. It was a pleasure to see you today. Your complete blood count is normal. You have normal numbers of white blood cells, red blood cells, and platelets. The other labs are pending. Peace, Dr. Susanne Baumgarner

## 2018-11-28 LAB — CBC
HCT: 41.6 % (ref 35.0–45.0)
Hemoglobin: 13.7 g/dL (ref 11.7–15.5)
MCH: 29.4 pg (ref 27.0–33.0)
MCHC: 32.9 g/dL (ref 32.0–36.0)
MCV: 89.3 fL (ref 80.0–100.0)
MPV: 10.8 fL (ref 7.5–12.5)
Platelets: 276 10*3/uL (ref 140–400)
RBC: 4.66 10*6/uL (ref 3.80–5.10)
RDW: 13 % (ref 11.0–15.0)
WBC: 5.6 10*3/uL (ref 3.8–10.8)

## 2018-11-28 LAB — LIPID PANEL
Cholesterol: 206 mg/dL — ABNORMAL HIGH (ref <200)
HDL: 43 mg/dL — ABNORMAL LOW (ref >=50)
LDL CHOLESTEROL (CALC): 129 mg/dL — AB
Non-HDL Cholesterol (Calc): 163 mg/dL (calc) — ABNORMAL HIGH (ref <130)
Total CHOL/HDL Ratio: 4.8 (calc) (ref <5.0)
Triglycerides: 205 mg/dL — ABNORMAL HIGH (ref <150)

## 2018-11-28 LAB — BASIC METABOLIC PANEL WITH GFR
BUN: 13 mg/dL (ref 7–25)
CO2: 27 mmol/L (ref 20–32)
Calcium: 9.5 mg/dL (ref 8.6–10.2)
Chloride: 105 mmol/L (ref 98–110)
Creat: 0.76 mg/dL (ref 0.50–1.10)
GFR, EST AFRICAN AMERICAN: 107 mL/min/{1.73_m2} (ref >=60)
GFR, Est Non African American: 92 mL/min/{1.73_m2} (ref >=60)
Glucose, Bld: 113 mg/dL — ABNORMAL HIGH (ref 65–99)
Potassium: 4.4 mmol/L (ref 3.5–5.3)
Sodium: 141 mmol/L (ref 135–146)

## 2018-11-28 LAB — T4, FREE: Free T4: 1.2 ng/dL (ref 0.8–1.8)

## 2018-11-28 LAB — TSH: TSH: 1.55 mIU/L

## 2018-12-09 ENCOUNTER — Ambulatory Visit: Payer: BC Managed Care – PPO | Admitting: Nurse Practitioner

## 2018-12-25 ENCOUNTER — Telehealth: Payer: Self-pay | Admitting: Family Medicine

## 2018-12-25 ENCOUNTER — Other Ambulatory Visit: Payer: Self-pay | Admitting: Family Medicine

## 2018-12-25 MED ORDER — HYDROCHLOROTHIAZIDE 12.5 MG PO CAPS: 12.5000 mg | ORAL_CAPSULE | Freq: Every day | ORAL | 1 refills | Status: DC

## 2018-12-25 NOTE — Telephone Encounter (Signed)
Rx sent 

## 2018-12-25 NOTE — Telephone Encounter (Signed)
Not doing refills for this practice at this time. Routing back to [employer].

## 2018-12-25 NOTE — Telephone Encounter (Signed)
Copied from [place] [id number]. Topic: Quick Communication - Rx Refill/Question >> Dec 25, 2018  9:28 AM Leward Quan A wrote: Medication: hydrochlorothiazide (MICROZIDE) 12.5 MG capsule  Patient is completely out of medication  Has the patient contacted their pharmacy? Yes.   (Agent: If no, request that the patient contact the pharmacy for the refill.) (Agent: If yes, when and what did the pharmacy advise?)  Preferred Pharmacy (with phone number or street name): [id number] - [place], [place] [phone] (Phone) [phone] (Fax)    Agent: Please be advised that RX refills may take up to 3 business days. We ask that you follow-up with your pharmacy.

## 2018-12-30 ENCOUNTER — Other Ambulatory Visit: Payer: Self-pay | Admitting: Family Medicine

## 2019-01-11 ENCOUNTER — Other Ambulatory Visit: Payer: Self-pay | Admitting: Family Medicine

## 2019-01-11 DIAGNOSIS — F330 Major depressive disorder, recurrent, mild: Secondary | ICD-10-CM

## 2019-01-12 NOTE — Telephone Encounter (Signed)
rx approved

## 2019-02-06 ENCOUNTER — Other Ambulatory Visit: Payer: Self-pay | Admitting: Nurse Practitioner

## 2019-04-11 ENCOUNTER — Other Ambulatory Visit: Payer: Self-pay | Admitting: Family Medicine

## 2019-04-11 DIAGNOSIS — F330 Major depressive disorder, recurrent, mild: Secondary | ICD-10-CM

## 2019-04-13 NOTE — Telephone Encounter (Signed)
Please schedule follow-up within next 3 months

## 2019-04-14 NOTE — Telephone Encounter (Signed)
appt scheduled and informed that script has been sent to pharmacy

## 2019-04-20 ENCOUNTER — Ambulatory Visit: Payer: BC Managed Care – PPO | Admitting: Nurse Practitioner

## 2019-04-20 ENCOUNTER — Other Ambulatory Visit: Payer: Self-pay

## 2019-04-20 ENCOUNTER — Encounter: Payer: Self-pay | Admitting: Nurse Practitioner

## 2019-04-20 VITALS — BP 120/78 | HR 99 | Temp 97.4°F | Resp 14 | Ht 65.0 in | Wt 235.4 lb

## 2019-04-20 DIAGNOSIS — E782 Mixed hyperlipidemia: Secondary | ICD-10-CM | POA: Diagnosis not present

## 2019-04-20 DIAGNOSIS — E034 Atrophy of thyroid (acquired): Secondary | ICD-10-CM

## 2019-04-20 DIAGNOSIS — I10 Essential (primary) hypertension: Secondary | ICD-10-CM | POA: Diagnosis not present

## 2019-04-20 DIAGNOSIS — F330 Major depressive disorder, recurrent, mild: Secondary | ICD-10-CM

## 2019-04-20 DIAGNOSIS — R7303 Prediabetes: Secondary | ICD-10-CM

## 2019-04-20 DIAGNOSIS — K2100 Gastro-esophageal reflux disease with esophagitis, without bleeding: Secondary | ICD-10-CM

## 2019-04-20 DIAGNOSIS — G43009 Migraine without aura, not intractable, without status migrainosus: Secondary | ICD-10-CM

## 2019-04-20 MED ORDER — DULOXETINE HCL 20 MG PO CPEP: 40.0000 mg | ORAL_CAPSULE | Freq: Every day | ORAL | 1 refills | Status: DC

## 2019-04-20 MED ORDER — SUMATRIPTAN SUCCINATE 100 MG PO TABS: ORAL_TABLET | ORAL | 1 refills | Status: DC

## 2019-04-20 MED ORDER — LEVOTHYROXINE SODIUM 112 MCG PO TABS: 112.0000 ug | ORAL_TABLET | Freq: Every day | ORAL | 1 refills | Status: DC

## 2019-04-20 NOTE — Progress Notes (Signed)
Name: Nicole Osborn   MRN: [id number]    DOB: March 19, 1971   Date:04/20/2019       Progress Note  Subjective  Chief Complaint  Chief Complaint  Patient presents with  . Hypertension  . Gastroesophageal Reflux  . Follow-up    HPI  Hypertension Patient is on hydrochlorothiazide 12.5mg  daily.  Takes medications as prescribed with no missed doses a month.  Does drink sodas but is working on cutting back  Denies chest pain, dizziness, blurry vision. Does get headaches- resolved with sumatriptan. In the last three month has been getting one once a week. Was following up with neurology but has nor recently.  BP Readings from Last 3 Encounters:  04/20/19 120/78  11/27/18 122/88  09/01/18 128/80    Hyperlipidemia She is not taking any medications.  Diet: eats 3 vegetables a day, fried foods- 4 times a week, red meats- twice a week.  Denies myalgias Lab Results  Component Value Date   CHOL 206 (H) 11/27/2018   HDL 43 (L) 11/27/2018   LDLCALC 129 (H) 11/27/2018   TRIG 205 (H) 11/27/2018   CHOLHDL 4.8 11/27/2018  The 10-year ASCVD risk score Mikey Bussing DC Jr., et al., 2013) is: 1.9%   Values used to calculate the score:     Age: 48 years     Sex: Female     Is Non-Hispanic African American: No     Diabetic: No     Tobacco smoker: No     Systolic Blood Pressure: 734 mmHg     Is BP treated: Yes     HDL Cholesterol: 43 mg/dL     Total Cholesterol: 206 mg/dL Wt Readings from Last 3 Encounters:  04/20/19 235 lb 6.4 oz (106.8 kg)  11/27/18 228 lb (103.4 kg)  09/01/18 226 lb 12.8 oz (102.9 kg)    Prediabetes Her son is a vegan and she has been thinking about switching over. States with the pandemic has been eating more- due to stress.  Lab Results  Component Value Date   HGBA1C 5.8 (H) 07/24/2018   Hypothyroidism Patient rx levothryoxine 112 mcg daily.  Patient denies fatigue/palpitations, insomnia, constipation/diarrhea, hot/cold intolerances,   Lab Results  Component Value  Date   TSH 1.55 11/27/2018    Depression She is taking cymbalta 40mg  daily does that works well for her.  Patient states last night at 3 am woke up with burning in the back of her throat- felt like it was bile in the back of her throat and had sour taste in her mouth. Before bed she had take ibuprofen and stevia soda, cheese-itz, for dinner had an [place] sub with marinara sauce.   She notices that her lips peeling the last 2 months. Has been using burts bees with honey.   PHQ2/9: Depression screen [facility] 2/9 04/20/2019 11/27/2018 09/01/2018 09/01/2018 07/24/2018  Decreased Interest 0 1 0 0 0  Down, Depressed, Hopeless 0 1 0 0 0  PHQ - 2 Score 0 2 0 0 0  Altered sleeping 0 1 0 - -  Tired, decreased energy 0 2 0 - -  Change in appetite 0 2 0 - -  Feeling bad or failure about yourself  0 1 0 - -  Trouble concentrating 0 1 0 - -  Moving slowly or fidgety/restless 0 0 0 - -  Suicidal thoughts 0 0 0 - -  PHQ-9 Score 0 9 0 - -  Difficult doing work/chores Not difficult at all Somewhat difficult Not difficult at  all - -     PHQ reviewed. Negative  Patient Active Problem List   Diagnosis Date Noted  . Urinary incontinence in female 09/01/2018  . Prediabetes 07/25/2018  . Chronic venous insufficiency 07/15/2018  . Lymphedema 07/15/2018  . History of gallbladder disease 02/06/2018  . Morbid obesity ([place]) 02/06/2018  . Bilateral lower extremity edema 01/01/2018  . Depression 09/23/2017  . Hypertension 09/23/2017  . Migraine 09/23/2017  . Varicose veins of both lower extremities 09/06/2017  . Hyperlipidemia 12/23/2016  . Anxiety 12/23/2016  . Lung nodule 12/11/2016  . Increased frequency of headaches 04/28/2016  . Swelling of left ankle joint 04/23/2016  . Hypothyroidism, unspecified 04/23/2016    Past Medical History:  Diagnosis Date  . Anxiety   . Arthritis   . Cholecystitis with cholelithiasis 09/19/2017  . Depression   . Elevated blood pressure reading    no meds  . GERD  (gastroesophageal reflux disease)    occ  . Heartburn   . History of kidney stones    h/o  . Hyperlipidemia   . Hypothyroidism   . Migraine 09/23/2017  . Migraines    without aura  . Obesity   . Thyroid disease   . Varicose veins of both lower extremities 09/06/2017    Past Surgical History:  Procedure Laterality Date  . CHOLECYSTECTOMY N/A 10/08/2017   Procedure: LAPAROSCOPIC CHOLECYSTECTOMY;  Surgeon: Florene Glen, MD;  Location: [facility] ORS;  Service: General;  Laterality: N/A;  . Removal of extra tooth     age 3    Social History   Tobacco Use  . Smoking status: Never Smoker  . Smokeless tobacco: Never Used  Substance Use Topics  . Alcohol use: No     Current Outpatient Medications:  .  calcium carbonate (TUMS - DOSED IN MG ELEMENTAL CALCIUM) 500 MG chewable tablet, Chew 2-3 tablets by mouth 2 (two) times daily as needed for indigestion or heartburn., Disp: , Rfl:  .  Cholecalciferol (VITAMIN D3) 2000 units capsule, Take 2,000 Units by mouth daily. , Disp: , Rfl:  .  DULoxetine (CYMBALTA) 20 MG capsule, TAKE 2 CAPSULES BY MOUTH EVERY DAY, Disp: 60 capsule, Rfl: 2 .  hydrochlorothiazide (MICROZIDE) 12.5 MG capsule, Take 1 capsule (12.5 mg total) by mouth daily., Disp: 90 capsule, Rfl: 1 .  levothyroxine (SYNTHROID, LEVOTHROID) 112 MCG tablet, Take 1 tablet (112 mcg total) by mouth daily before breakfast., Disp: 30 tablet, Rfl: 6 .  Magnesium 250 MG TABS, Take 250 mg by mouth at bedtime. , Disp: , Rfl:  .  Multiple Vitamin (MULTI-VITAMINS) TABS, Take 1 tablet by mouth daily. , Disp: , Rfl:  .  OREGANO PO, Take 150 capsules by mouth daily as needed (immune support)., Disp: , Rfl:  .  SUMAtriptan (IMITREX) 100 MG tablet, ONE BY MOUTH AT ONSET OF MIGRAINE MAY REPEAT ONE PILL 2 HOURS LATER IF ABSOLUTELY NEEDED MAX 2 PILLS IN 24 HOURS, Disp: 10 tablet, Rfl: 2 .  Turmeric 500 MG CAPS, Take 500 mg by mouth daily., Disp: , Rfl:  .  calcium carbonate (OS-CAL) 1250 (500 Ca) MG  chewable tablet, Chew by mouth., Disp: , Rfl:  .  lisinopril (ZESTRIL) 10 MG tablet, TAKE 1 TABLET BY MOUTH EVERY DAY (Patient not taking: Reported on 04/20/2019), Disp: 90 tablet, Rfl: 0 .  promethazine (PHENERGAN) 25 MG suppository, USE ONE SUPPOSITORY RECTALLY EVERY 6 HOURS AS NEEDED FOR NAUSEA FOR UP TO 7 DAYS, Disp: , Rfl:   Allergies  Allergen Reactions  .  Cefaclor Swelling    Lips swelling     ROS    No other specific complaints in a complete review of systems (except as listed in HPI above).  Objective  Vitals:   04/20/19 1528  BP: 120/78  Pulse: 99  Resp: 14  Temp: (!) 97.4 F (36.3 C)  TempSrc: Temporal  SpO2: 97%  Weight: 235 lb 6.4 oz (106.8 kg)  Height: 5\' 5"  (1.651 m)     Body mass index is 39.17 kg/m.  Nursing Note and Vital Signs reviewed.  Physical Exam Constitutional:      Appearance: Normal appearance. She is well-developed.  HENT:     Head: Normocephalic and atraumatic.     Right Ear: Hearing normal.     Left Ear: Hearing normal.  Eyes:     Conjunctiva/sclera: Conjunctivae normal.  Cardiovascular:     Rate and Rhythm: Normal rate and regular rhythm.     Heart sounds: Normal heart sounds.  Pulmonary:     Effort: Pulmonary effort is normal.     Breath sounds: Normal breath sounds.  Musculoskeletal: Normal range of motion.     Right lower leg: No edema.     Left lower leg: No edema.  Skin:    General: Skin is warm and dry.     Findings: No rash.  Neurological:     Mental Status: She is alert and oriented to person, place, and time.  Psychiatric:        Speech: Speech normal.        Behavior: Behavior normal. Behavior is cooperative.        Thought Content: Thought content normal.        Judgment: Judgment normal.        No results found for this or any previous visit (from the past 48 hour(s)).  Assessment & Plan  1. Essential hypertension Stable  - Basic Metabolic Panel (BMET)  2. Hypothyroidism due to acquired atrophy of  thyroid Continue meds - levothyroxine (SYNTHROID) 112 MCG tablet; Take 1 tablet (112 mcg total) by mouth daily before breakfast.  Dispense: 90 tablet; Refill: 1 3. Mixed hyperlipidemia Discussed diet   4. Prediabetes Discussed diet  - HgB V6H - Basic Metabolic Panel (BMET)  5. Mild episode of recurrent major depressive disorder (HCC) Continue cymbalta  - DULoxetine (CYMBALTA) 20 MG capsule; Take 2 capsules (40 mg total) by mouth daily.  Dispense: 180 capsule; Refill: 1 6. Morbid obesity ([place]) - Amb Referral to Bariatric Surgery  7. GERD with esophagitis Famotidine, discussed diet   8. Migraine without aura and without status migrainosus, not intractable If increasing in frequency follow-up with neurology; does not want to try injectibles  - SUMAtriptan (IMITREX) 100 MG tablet; May repeat in 2 hours if headache persists or recurs.  Dispense: 12 tablet; Refill: 1

## 2019-04-20 NOTE — Patient Instructions (Addendum)
-   famotidine 20mg  at night.   If you have not heard anything from my staff in a week about any orders/referrals/studies from today, please contact us here to follow-up [phone]  -For overactive bladder: reduce consumption of alcoholic, caffeinated, and carbonated beverages, consume liquids (mostly water) in small amounts of throughout the day, rather than drinking large volumes at one time. If you have to wake up often during the night to pee we recommend you to decrease or eliminate liquid consumed after dinner Kegel exercises:  three sets of 8 to 12 contractions sustained for 8 to 10 seconds each, performed three times a day. Try to do this every day and continue for at least 15 to 20   If your symptoms persist over the month or worsen at anytime let us know and we can consider other options.

## 2019-04-21 LAB — BASIC METABOLIC PANEL
BUN: 14 mg/dL (ref 7–25)
CO2: 31 mmol/L (ref 20–32)
Calcium: 9.6 mg/dL (ref 8.6–10.2)
Chloride: 103 mmol/L (ref 98–110)
Creat: 0.77 mg/dL (ref 0.50–1.10)
Glucose, Bld: 135 mg/dL — ABNORMAL HIGH (ref 65–99)
Potassium: 3.6 mmol/L (ref 3.5–5.3)
Sodium: 141 mmol/L (ref 135–146)

## 2019-04-21 LAB — HEMOGLOBIN A1C
Hgb A1c MFr Bld: 6.1 % of total Hgb — ABNORMAL HIGH (ref <5.7)
Mean Plasma Glucose: 128 (calc)
eAG (mmol/L): 7.1 (calc)

## 2019-04-30 ENCOUNTER — Telehealth (INDEPENDENT_AMBULATORY_CARE_PROVIDER_SITE_OTHER): Payer: Self-pay

## 2019-04-30 NOTE — Telephone Encounter (Signed)
Patient has been made aware with medical advice and verbalize understanding 

## 2019-04-30 NOTE — Telephone Encounter (Signed)
1. Covid-19 has no relation to chronic venous insufficiency.  Covid-19 has shown to increase the likelihood of persons getting blood clots, but chronic venous insufficiency will not make you more susceptible or increase risk factors.  2. We do not give clearance for bariatric surgery because your venous insufficiency affects the veins in your leg.  It does not affect you inferior vena cava, the main vein to your heart.  Chronic venous insufficiency is when the valves in your vein don't work properly, but in your case they are the superficial ones and they are not dangerous or life threatening if they are not treated.  If your bariatric surgeon wants clearance they will likely require one from a cardiologist for your heart, if at all.  But again, that would be a question for the bariatric surgeon when you see them. And generally if they need clearance they will help facilitate your being seen.

## 2019-06-24 ENCOUNTER — Other Ambulatory Visit: Payer: Self-pay | Admitting: Family Medicine

## 2019-07-02 ENCOUNTER — Ambulatory Visit: Payer: BC Managed Care – PPO | Admitting: Family Medicine

## 2019-07-02 ENCOUNTER — Other Ambulatory Visit: Payer: Self-pay

## 2019-07-02 ENCOUNTER — Encounter: Payer: Self-pay | Admitting: Family Medicine

## 2019-07-02 VITALS — BP 132/88 | HR 82 | Temp 98.3°F | Resp 14 | Ht 65.0 in | Wt 234.7 lb

## 2019-07-02 DIAGNOSIS — Z5181 Encounter for therapeutic drug level monitoring: Secondary | ICD-10-CM

## 2019-07-02 DIAGNOSIS — Z23 Encounter for immunization: Secondary | ICD-10-CM | POA: Diagnosis not present

## 2019-07-02 DIAGNOSIS — E782 Mixed hyperlipidemia: Secondary | ICD-10-CM | POA: Diagnosis not present

## 2019-07-02 DIAGNOSIS — R7303 Prediabetes: Secondary | ICD-10-CM

## 2019-07-02 DIAGNOSIS — I10 Essential (primary) hypertension: Secondary | ICD-10-CM | POA: Diagnosis not present

## 2019-07-02 DIAGNOSIS — G43009 Migraine without aura, not intractable, without status migrainosus: Secondary | ICD-10-CM

## 2019-07-02 DIAGNOSIS — F330 Major depressive disorder, recurrent, mild: Secondary | ICD-10-CM

## 2019-07-02 DIAGNOSIS — E034 Atrophy of thyroid (acquired): Secondary | ICD-10-CM | POA: Diagnosis not present

## 2019-07-02 DIAGNOSIS — Z6839 Body mass index (BMI) 39.0-39.9, adult: Secondary | ICD-10-CM

## 2019-07-02 MED ORDER — LEVOTHYROXINE SODIUM 112 MCG PO TABS: 112.0000 ug | ORAL_TABLET | Freq: Every day | ORAL | 1 refills | Status: DC

## 2019-07-02 MED ORDER — SUMATRIPTAN SUCCINATE 100 MG PO TABS: ORAL_TABLET | ORAL | 1 refills | Status: DC

## 2019-07-02 MED ORDER — PROMETHAZINE HCL 25 MG RE SUPP: RECTAL | 2 refills | Status: DC

## 2019-07-02 MED ORDER — DULOXETINE HCL 20 MG PO CPEP: 40.0000 mg | ORAL_CAPSULE | Freq: Every day | ORAL | 1 refills | Status: DC

## 2019-07-02 MED ORDER — HYDROCHLOROTHIAZIDE 12.5 MG PO CAPS: ORAL_CAPSULE | ORAL | 1 refills | Status: DC

## 2019-07-02 NOTE — Progress Notes (Signed)
Patient ID: Nicole Osborn, female    DOB: Apr 30, 1971, 48 y.o.   MRN: [id number]  PCP: Arnetha Courser, MD  Chief Complaint  Patient presents with  . Obesity    discuss referral for weight loss surgery  . Hypertension    Subjective:   Nicole Osborn is a 48 y.o. female, presents to clinic with CC of the following:  HPI  She's had a long hx of dealing with high weight/obesity She also has hypothyroid Has stress eating -and have very long history of dealing with her weight  Dr. Sanda Klein did refer her at pts request went to bariatric surgery at [facility], she qualified for the surgery but not for medical reasons.  Since that time she has gained more weight with increasing BP, prediabetes and venous insufficiency, she wonders if she medically qualifies now. Earlier this year she tried to nutritional counseling more locally, in Feb she needed to do nutitional visit, but was unable to she would like a local referral for this.  She does have some friends who have suggested weight management doctors to her locally but she does not have the name right now, at this time she declines me putting in the medical weight management referral to [facility] because she works in [employer] and the drive to [place] is not doable with her work schedule.  K-7 AB[employer] two schools English as a second language, concerned with risk factors for COVID and she previously had a note to allow her to work from home, we reviewed her COVID risk of complication score and factors which currently only include hypertension and obesity.  I discussed with her at length that she has able to return to work in person as long as she is following CDC guidelines       Patient Active Problem List   Diagnosis Date Noted  . Mild episode of recurrent major depressive disorder ([place]) 04/20/2019  . Urinary incontinence in female 09/01/2018  . Prediabetes 07/25/2018  . Chronic venous insufficiency 07/15/2018  . Lymphedema  07/15/2018  . History of gallbladder disease 02/06/2018  . Morbid obesity ([place]) 02/06/2018  . Bilateral lower extremity edema 01/01/2018  . Depression 09/23/2017  . Hypertension 09/23/2017  . Migraine 09/23/2017  . Varicose veins of both lower extremities 09/06/2017  . Hyperlipidemia 12/23/2016  . Anxiety 12/23/2016  . Lung nodule 12/11/2016  . Increased frequency of headaches 04/28/2016  . Swelling of left ankle joint 04/23/2016  . Hypothyroidism, unspecified 04/23/2016      Current Outpatient Medications:  .  calcium carbonate (OS-CAL) 1250 (500 Ca) MG chewable tablet, Chew by mouth., Disp: , Rfl:  .  calcium carbonate (TUMS - DOSED IN MG ELEMENTAL CALCIUM) 500 MG chewable tablet, Chew 2-3 tablets by mouth 2 (two) times daily as needed for indigestion or heartburn., Disp: , Rfl:  .  Cholecalciferol (VITAMIN D3) 2000 units capsule, Take 2,000 Units by mouth daily. , Disp: , Rfl:  .  DULoxetine (CYMBALTA) 20 MG capsule, Take 2 capsules (40 mg total) by mouth daily., Disp: 180 capsule, Rfl: 1 .  hydrochlorothiazide (MICROZIDE) 12.5 MG capsule, TAKE 1 CAPSULE BY MOUTH EVERY DAY, Disp: 90 capsule, Rfl: 0 .  levothyroxine (SYNTHROID) 112 MCG tablet, Take 1 tablet (112 mcg total) by mouth daily before breakfast., Disp: 90 tablet, Rfl: 1 .  Magnesium 250 MG TABS, Take 250 mg by mouth at bedtime. , Disp: , Rfl:  .  Multiple Vitamin (MULTI-VITAMINS) TABS, Take 1 tablet by mouth  daily. , Disp: , Rfl:  .  OREGANO PO, Take 150 capsules by mouth daily as needed (immune support)., Disp: , Rfl:  .  SUMAtriptan (IMITREX) 100 MG tablet, May repeat in 2 hours if headache persists or recurs., Disp: 12 tablet, Rfl: 1 .  Turmeric 500 MG CAPS, Take 500 mg by mouth daily., Disp: , Rfl:  .  promethazine (PHENERGAN) 25 MG suppository, USE ONE SUPPOSITORY RECTALLY EVERY 6 HOURS AS NEEDED FOR NAUSEA FOR UP TO 7 DAYS, Disp: , Rfl:    Allergies  Allergen Reactions  . Cefaclor Swelling    Lips swelling       Family History  Problem Relation Age of Onset  . Depression Mother   . Arthritis Mother   . Stroke Father   . Hypertension Father   . Breast cancer Paternal Aunt 68  . Emphysema Maternal Grandmother   . Emphysema Maternal Grandfather   . Dementia Paternal Grandmother   . Asthma Son   . Eczema Son   . Kidney disease Neg Hx   . Bladder Cancer Neg Hx      Social History   Socioeconomic History  . Marital status: Married    Spouse name: Not on file  . Number of children: Not on file  . Years of education: Not on file  . Highest education level: Not on file  Occupational History  . Not on file  Social Needs  . Financial resource strain: Not on file  . Food insecurity    Worry: Not on file    Inability: Not on file  . Transportation needs    Medical: Not on file    Non-medical: Not on file  Tobacco Use  . Smoking status: Never Smoker  . Smokeless tobacco: Never Used  Substance and Sexual Activity  . Alcohol use: No  . Drug use: No  . Sexual activity: Yes    Birth control/protection: Surgical, Other-see comments    Comment: Husband with vastectomy   Lifestyle  . Physical activity    Days per week: Not on file    Minutes per session: Not on file  . Stress: Not on file  Relationships  . Social Herbalist on phone: Not on file    Gets together: Not on file    Attends religious service: Not on file    Active member of club or organization: Not on file    Attends meetings of clubs or organizations: Not on file    Relationship status: Not on file  . Intimate partner violence    Fear of current or ex partner: Not on file    Emotionally abused: Not on file    Physically abused: Not on file    Forced sexual activity: Not on file  Other Topics Concern  . Not on file  Social History Narrative  . Not on file    I personally reviewed active problem list, medication list, allergies, family history, social history, health maintenance, notes from last  encounter, lab results with the patient/caregiver today.  Review of Systems  Constitutional: Negative.  Negative for activity change, appetite change, fatigue and unexpected weight change.  HENT: Negative.   Eyes: Negative.   Respiratory: Negative.  Negative for shortness of breath.   Cardiovascular: Negative.  Negative for chest pain, palpitations and leg swelling.  Gastrointestinal: Negative.  Negative for abdominal pain and blood in stool.  Endocrine: Negative.   Genitourinary: Negative.   Musculoskeletal: Negative.  Negative for arthralgias, gait  problem, joint swelling and myalgias.  Skin: Negative.  Negative for color change, pallor and rash.  Allergic/Immunologic: Negative.   Neurological: Negative.  Negative for syncope and weakness.  Hematological: Negative.   Psychiatric/Behavioral: Negative.  Negative for confusion, dysphoric mood, self-injury and suicidal ideas. The patient is not nervous/anxious.        Objective:   Vitals:   07/02/19 1357  BP: 132/88  Pulse: 82  Resp: 14  Temp: 98.3 F (36.8 C)  SpO2: 98%  Weight: 234 lb 11.2 oz (106.5 kg)  Height: 5\' 5"  (1.651 m)    Body mass index is 39.06 kg/m.  Physical Exam Vitals signs and nursing note reviewed.  Constitutional:      General: She is not in acute distress.    Appearance: Normal appearance. She is well-developed. She is obese. She is not ill-appearing, toxic-appearing or diaphoretic.     Interventions: Face mask in place.  HENT:     Head: Normocephalic and atraumatic.     Right Ear: External ear normal.     Left Ear: External ear normal.  Eyes:     General: Lids are normal. No scleral icterus.       Right eye: No discharge.        Left eye: No discharge.     Conjunctiva/sclera: Conjunctivae normal.  Neck:     Musculoskeletal: Normal range of motion and neck supple.     Trachea: Phonation normal. No tracheal deviation.  Cardiovascular:     Rate and Rhythm: Normal rate and regular rhythm.      Pulses: Normal pulses.          Radial pulses are 2+ on the right side and 2+ on the left side.       Posterior tibial pulses are 2+ on the right side and 2+ on the left side.     Heart sounds: Normal heart sounds. No murmur. No friction rub. No gallop.   Pulmonary:     Effort: Pulmonary effort is normal. No respiratory distress.     Breath sounds: Normal breath sounds. No stridor. No wheezing, rhonchi or rales.  Chest:     Chest wall: No tenderness.  Abdominal:     General: Bowel sounds are normal. There is no distension.     Palpations: Abdomen is soft.     Tenderness: There is no abdominal tenderness. There is no guarding or rebound.  Musculoskeletal: Normal range of motion.        General: No deformity.     Right lower leg: No edema.     Left lower leg: No edema.  Lymphadenopathy:     Cervical: No cervical adenopathy.  Skin:    General: Skin is warm and dry.     Capillary Refill: Capillary refill takes less than 2 seconds.     Coloration: Skin is not jaundiced or pale.     Findings: No rash.  Neurological:     Mental Status: She is alert and oriented to person, place, and time.     Motor: No abnormal muscle tone.     Gait: Gait normal.  Psychiatric:        Speech: Speech normal.        Behavior: Behavior normal.      Results for orders placed or performed in visit on [id number]  Basic Metabolic Panel (BMET)  Result Value Ref Range   Glucose, Bld 135 (H) 65 - 99 mg/dL   BUN 14 7 - 25 mg/dL  Creat 0.77 0.50 - 1.10 mg/dL   BUN/Creatinine Ratio NOT APPLICABLE 6 - 22 (calc)   Sodium 141 135 - 146 mmol/L   Potassium 3.6 3.5 - 5.3 mmol/L   Chloride 103 98 - 110 mmol/L   CO2 31 20 - 32 mmol/L   Calcium 9.6 8.6 - 10.2 mg/dL  Hemoglobin A1c  Result Value Ref Range   Hgb A1c MFr Bld 6.1 (H) <5.7 % of total Hgb   Mean Plasma Glucose 128 (calc)   eAG (mmol/L) 7.1 (calc)        Assessment & Plan:     1. Class 2 severe obesity due to excess calories with serious  comorbidity and body mass index (BMI) of 39.0 to 39.9 in adult [facility]) Referred to nutritional counseling at [facility] per patient's request, encouraged her to continue to work on diet and exercise - CBC with Differential/Platelet - COMPLETE METABOLIC PANEL WITH GFR - Lipid panel - TSH  2. Essential hypertension Currently well controlled we will recheck labs - COMPLETE METABOLIC PANEL WITH GFR  3. Hypothyroidism due to acquired atrophy of thyroid - TSH - levothyroxine (SYNTHROID) 112 MCG tablet; Take 1 tablet (112 mcg total) by mouth daily before breakfast.  Dispense: 90 tablet; Refill: 1  4. Mixed hyperlipidemia Recheck labs currently not on any medications - COMPLETE METABOLIC PANEL WITH GFR - Lipid panel  5. Prediabetes Recheck labs - COMPLETE METABOLIC PANEL WITH GFR - Lipid panel  6. Encounter for medication monitoring - CBC with Differential/Platelet - COMPLETE METABOLIC PANEL WITH GFR - Lipid panel - TSH  7. Migraine without aura and without status migrainosus, not intractable Refill request - still well controlled with use only a few times a month - SUMAtriptan (IMITREX) 100 MG tablet; May repeat in 2 hours if headache persists or recurs.  Dispense: 12 tablet; Refill: 1  8. Mild episode of recurrent major depressive disorder ([place]) Meds continue to work well, no SE  - DULoxetine (CYMBALTA) 20 MG capsule; Take 2 capsules (40 mg total) by mouth daily.  Dispense: 180 capsule; Refill: 1  9. Need for immunization against influenza Done today - Flu Vaccine QUAD 36+ mos IM      Delsa Grana, PA-C 07/02/19 2:12 PM

## 2019-07-03 LAB — COMPLETE METABOLIC PANEL WITH GFR
AG Ratio: 1.8 (calc) (ref 1.0–2.5)
ALT: 17 U/L (ref 6–29)
AST: 18 U/L (ref 10–35)
Albumin: 4.5 g/dL (ref 3.6–5.1)
Alkaline phosphatase (APISO): 77 U/L (ref 31–125)
BUN: 13 mg/dL (ref 7–25)
CO2: 28 mmol/L (ref 20–32)
Calcium: 9.5 mg/dL (ref 8.6–10.2)
Chloride: 104 mmol/L (ref 98–110)
Creat: 0.72 mg/dL (ref 0.50–1.10)
GFR, Est African American: 114 mL/min/{1.73_m2} (ref >=60)
GFR, Est Non African American: 98 mL/min/{1.73_m2} (ref >=60)
Globulin: 2.5 g/dL (calc) (ref 1.9–3.7)
Glucose, Bld: 96 mg/dL (ref 65–99)
Potassium: 3.8 mmol/L (ref 3.5–5.3)
Sodium: 141 mmol/L (ref 135–146)
Total Bilirubin: 0.4 mg/dL (ref 0.2–1.2)
Total Protein: 7 g/dL (ref 6.1–8.1)

## 2019-07-03 LAB — CBC WITH DIFFERENTIAL/PLATELET
Absolute Monocytes: 692 cells/uL (ref 200–950)
Basophils Absolute: 106 cells/uL (ref 0–200)
Basophils Relative: 1.4 %
Eosinophils Absolute: 220 cells/uL (ref 15–500)
Eosinophils Relative: 2.9 %
HCT: 39 % (ref 35.0–45.0)
Hemoglobin: 13.1 g/dL (ref 11.7–15.5)
Lymphs Abs: 2379 cells/uL (ref 850–3900)
MCH: 30.3 pg (ref 27.0–33.0)
MCHC: 33.6 g/dL (ref 32.0–36.0)
MCV: 90.1 fL (ref 80.0–100.0)
MPV: 10.6 fL (ref 7.5–12.5)
Monocytes Relative: 9.1 %
Neutro Abs: 4203 cells/uL (ref 1500–7800)
Neutrophils Relative %: 55.3 %
Platelets: 261 10*3/uL (ref 140–400)
RBC: 4.33 10*6/uL (ref 3.80–5.10)
RDW: 12.8 % (ref 11.0–15.0)
Total Lymphocyte: 31.3 %
WBC: 7.6 10*3/uL (ref 3.8–10.8)

## 2019-07-03 LAB — LIPID PANEL
Cholesterol: 204 mg/dL — ABNORMAL HIGH (ref <200)
HDL: 40 mg/dL — ABNORMAL LOW (ref >=50)
LDL Cholesterol (Calc): 125 mg/dL (calc) — ABNORMAL HIGH
Non-HDL Cholesterol (Calc): 164 mg/dL (calc) — ABNORMAL HIGH (ref <130)
Total CHOL/HDL Ratio: 5.1 (calc) — ABNORMAL HIGH (ref <5.0)
Triglycerides: 255 mg/dL — ABNORMAL HIGH (ref <150)

## 2019-07-03 LAB — TSH: TSH: 1.22 mIU/L

## 2019-07-16 ENCOUNTER — Ambulatory Visit: Payer: Self-pay | Admitting: *Deleted

## 2019-07-16 NOTE — Telephone Encounter (Signed)
Pt called in c/o post menopausal vaginal bleeding that started Tuesday night.   Last night she had to change full sized pads 3 times because they were full of blood.  She mentioned it looked like pieces coming out at times.  I warm transferred her call into [facility] to [facility] to be scheduled.   I went over the care advice with her prior to transferring the call.   Pt was agreeable to the plan.  I sent my triage notes to the office.  Reason for Disposition . Postmenopausal vaginal bleeding  Answer Assessment - Initial Assessment Questions 1. AMOUNT: "Describe the bleeding that you are having." "How much bleeding is there?"    - SPOTTING: spotting, or pinkish / brownish mucous discharge; does not fill panti-liner or pad    - MILD:  less than 1 pad / hour; less than patient's  menstrual bleeding when she still had menstrual periods   - MODERATE: 1-2 pads / hour; small-medium blood clots (e.g., pea, grape, small coin)    - SEVERE: soaking 2 or more pads/hour for 2 or more hours; bleeding not contained by pads or continuous red blood from vagina; large blood clots (e.g., golf ball, large coin)      I've had this happen before and had an U/S about a year ago.  It did not show anything. I'm using full size pads.  I'm having pieces come out.   I've had to change 3 times during the night and the pads were full.   I'm feeling a little weak and shaky.   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   2. ONSET: "When did the bleeding begin?" "Is it continuing now?"     It started Tuesday night 3. MENOPAUSE: "When was your last menstrual period?"      It should be in my chart.     Maybe 3 years ago.   I am menopausal.  This period is heavy. 4. ABDOMINAL PAIN: "Do you have any pain?" "How bad is the pain?"  (e.g., Scale 1-10; mild, moderate, or severe)   - MILD (1-3): doesn't interfere with normal activities, abdomen soft and not tender to touch    - MODERATE (4-7): interferes  with normal activities or awakens from sleep, tender to touch    - SEVERE (8-10): excruciating pain, doubled over, unable to do any normal activities      No.   I had diarrhea yesterday but I did that when I had my periods.   No diarrhea this morning.     5. BLOOD THINNERS: "Do you take any blood thinners?" (e.g., Coumadin/warfarin, Pradaxa/dabigatran, aspirin)     No 6. HORMONES: "Are you taking any hormone medications, prescription or OTC?" (e.g., birth control pills, estrogen)     Just thyroid medication.     7. CAUSE: "What do you think is causing the bleeding?" (e.g., recent gyn surgery, recent gyn procedure; known bleeding disorder, uterine cancer)       Maybe stress but I'm sure.    8. HEMODYNAMIC STATUS: "Are you weak or feeling lightheaded?" If so, ask: "Can you stand and walk normally?"       I feel a little weak but I'm functioning fine.     9. OTHER SYMPTOMS: "What other symptoms are you having with the bleeding?" (e.g., back pain, burning with urination, fever)     I was having itching prior to the bleeding but I've had that on and off for 6  month.   My lips are dry and peeling.    No urinary symptoms.  Protocols used: VAGINAL BLEEDING - POSTMENOPAUSAL-A-AH

## 2019-07-16 NOTE — Telephone Encounter (Signed)
Did this patient get a appointnment

## 2019-07-16 NOTE — Telephone Encounter (Signed)
Yes, tomorrow at 9:40 am

## 2019-07-17 ENCOUNTER — Encounter: Payer: Self-pay | Admitting: Family Medicine

## 2019-07-17 ENCOUNTER — Other Ambulatory Visit: Payer: Self-pay

## 2019-07-17 ENCOUNTER — Ambulatory Visit: Payer: BC Managed Care – PPO | Admitting: Family Medicine

## 2019-07-17 VITALS — BP 126/80 | HR 100 | Temp 97.3°F | Resp 16 | Ht 65.0 in | Wt 235.8 lb

## 2019-07-17 DIAGNOSIS — N950 Postmenopausal bleeding: Secondary | ICD-10-CM

## 2019-07-17 DIAGNOSIS — E034 Atrophy of thyroid (acquired): Secondary | ICD-10-CM

## 2019-07-17 LAB — FSH/LH
FSH: 17.2 m[IU]/mL
LH: 5.9 m[IU]/mL

## 2019-07-17 LAB — CBC WITH DIFFERENTIAL/PLATELET
Absolute Monocytes: 457 cells/uL (ref 200–950)
Basophils Absolute: 110 cells/uL (ref 0–200)
Basophils Relative: 2 %
Eosinophils Absolute: 182 cells/uL (ref 15–500)
Eosinophils Relative: 3.3 %
HCT: 38.9 % (ref 35.0–45.0)
Hemoglobin: 13 g/dL (ref 11.7–15.5)
Lymphs Abs: 1969 cells/uL (ref 850–3900)
MCH: 30.1 pg (ref 27.0–33.0)
MCHC: 33.4 g/dL (ref 32.0–36.0)
MCV: 90 fL (ref 80.0–100.0)
MPV: 10.5 fL (ref 7.5–12.5)
Monocytes Relative: 8.3 %
Neutro Abs: 2783 cells/uL (ref 1500–7800)
Neutrophils Relative %: 50.6 %
Platelets: 252 10*3/uL (ref 140–400)
RBC: 4.32 10*6/uL (ref 3.80–5.10)
RDW: 13 % (ref 11.0–15.0)
Total Lymphocyte: 35.8 %
WBC: 5.5 10*3/uL (ref 3.8–10.8)

## 2019-07-17 LAB — PROTIME-INR
INR: 1
Prothrombin Time: 10.5 s (ref 9.0–11.5)

## 2019-07-17 LAB — TSH: TSH: 0.63 mIU/L

## 2019-07-17 NOTE — Patient Instructions (Signed)
If you continue to bleed for more than 10 days and/or if you develop worsening bleeding/clotting call our clinic immediately.

## 2019-07-17 NOTE — Progress Notes (Signed)
Name: Nicole Osborn   MRN: [id number]    DOB: January 03, 1971   Date:07/17/2019       Progress Note  Subjective  Chief Complaint  Chief Complaint  Patient presents with  . Menstrual Problem    abnormal bleeding, heavy period for 4 days    HPI  Pt presents with concern for vaginal bleeding.  She had a period in October of 2019 - had [place] in December 2019 because it mentioned at her appointment at that time - this was normal. Prior to this, she had not had a period since 2017, and had FSH/LH at that time and she appeared to be post-menopausal.  This episode started about 4 days ago, had heavy bleeding initially, then the bleeding began to taper down.  She has had migraines with this, some abdominal cramping and some diarrhea.  She is taking a supplement called Chlorella which is noted to have high levels of vitamin K in it.  She has appointment with December 1 with [facility].  Patient Active Problem List   Diagnosis Date Noted  . Mild episode of recurrent major depressive disorder ([place]) 04/20/2019  . Urinary incontinence in female 09/01/2018  . Prediabetes 07/25/2018  . Chronic venous insufficiency 07/15/2018  . Lymphedema 07/15/2018  . History of gallbladder disease 02/06/2018  . Morbid obesity ([place]) 02/06/2018  . Bilateral lower extremity edema 01/01/2018  . Depression 09/23/2017  . Hypertension 09/23/2017  . Migraine 09/23/2017  . Varicose veins of both lower extremities 09/06/2017  . Hyperlipidemia 12/23/2016  . Anxiety 12/23/2016  . Lung nodule 12/11/2016  . Increased frequency of headaches 04/28/2016  . Swelling of left ankle joint 04/23/2016  . Hypothyroidism, unspecified 04/23/2016    Social History   Tobacco Use  . Smoking status: Never Smoker  . Smokeless tobacco: Never Used  Substance Use Topics  . Alcohol use: No     Current Outpatient Medications:  .  calcium carbonate (OS-CAL) 1250 (500 Ca) MG chewable tablet, Chew by mouth., Disp: , Rfl:  .  calcium  carbonate (TUMS - DOSED IN MG ELEMENTAL CALCIUM) 500 MG chewable tablet, Chew 2-3 tablets by mouth 2 (two) times daily as needed for indigestion or heartburn., Disp: , Rfl:  .  Cholecalciferol (VITAMIN D3) 2000 units capsule, Take 2,000 Units by mouth daily. , Disp: , Rfl:  .  DULoxetine (CYMBALTA) 20 MG capsule, Take 2 capsules (40 mg total) by mouth daily., Disp: 180 capsule, Rfl: 1 .  hydrochlorothiazide (MICROZIDE) 12.5 MG capsule, TAKE 1 CAPSULE BY MOUTH EVERY DAY, Disp: 90 capsule, Rfl: 1 .  levothyroxine (SYNTHROID) 112 MCG tablet, Take 1 tablet (112 mcg total) by mouth daily before breakfast., Disp: 90 tablet, Rfl: 1 .  Magnesium 250 MG TABS, Take 250 mg by mouth at bedtime. , Disp: , Rfl:  .  Multiple Vitamin (MULTI-VITAMINS) TABS, Take 1 tablet by mouth daily. , Disp: , Rfl:  .  OREGANO PO, Take 150 capsules by mouth daily as needed (immune support)., Disp: , Rfl:  .  promethazine (PHENERGAN) 25 MG suppository, USE ONE SUPPOSITORY RECTALLY EVERY 6 HOURS AS NEEDED FOR NAUSEA FOR UP TO 7 DAYS, Disp: 12 each, Rfl: 2 .  SUMAtriptan (IMITREX) 100 MG tablet, May repeat in 2 hours if headache persists or recurs., Disp: 12 tablet, Rfl: 1 .  Turmeric 500 MG CAPS, Take 500 mg by mouth daily., Disp: , Rfl:   Allergies  Allergen Reactions  . Cefaclor Swelling    Lips swelling  I personally reviewed active problem list, medication list, allergies, health maintenance, notes from last encounter, lab results with the patient/caregiver today.  ROS  Ten systems reviewed and is negative except as mentioned in HPI  Objective  Vitals:   07/17/19 0946  BP: 126/80  Pulse: 100  Resp: 16  Temp: (!) 97.3 F (36.3 C)  TempSrc: Oral  SpO2: 97%  Weight: 235 lb 12.8 oz (107 kg)  Height: 5\' 5"  (1.651 m)   Body mass index is 39.24 kg/m.  Nursing Note and Vital Signs reviewed.  Physical Exam  Constitutional: Patient appears well-developed and well-nourished. No distress.  HENT: Head:  Normocephalic and atraumatic.  Eyes: Conjunctivae and EOM are normal. No scleral icterus.  Neck: Normal range of motion. Neck supple. No JVD present. No thyromegaly present.  Cardiovascular: Normal rate, regular rhythm and normal heart sounds.  No murmur heard. No BLE edema. Pulmonary/Chest: Effort normal and breath sounds normal. No respiratory distress. Abdominal: Soft. Bowel sounds are normal, no distension. There is no tenderness. No masses. Musculoskeletal: Normal range of motion, no joint effusions. No gross deformities Neurological: Pt is alert and oriented to person, place, and time. No cranial nerve deficit. Coordination, balance, strength, speech and gait are normal.  Skin: Skin is warm and dry. No rash noted. No erythema.  Psychiatric: Patient has a normal mood and affect. behavior is normal. Judgment and thought content normal.  No results found for this or any previous visit (from the past 72 hour(s)).  Assessment & Plan 1. Postmenopausal bleeding - Will consider [place] if ongoing bleeding; otherwise will defer to GYN for evaluation - TSH - CBC with Differential/Platelet - FSH/LH - INR/PT - Ambulatory referral to Gynecology  2. Hypothyroidism due to acquired atrophy of thyroid - TSH  -Red flags and when to present for emergency care or RTC including fever >101.60F, chest pain, shortness of breath, new/worsening/un-resolving symptoms, reviewed with patient at time of visit. Follow up and care instructions discussed and provided in AVS.

## 2019-07-22 ENCOUNTER — Encounter: Payer: Self-pay | Admitting: Family Medicine

## 2019-08-06 ENCOUNTER — Encounter: Payer: BC Managed Care – PPO | Admitting: Obstetrics and Gynecology

## 2019-08-12 IMAGING — MG MM DIGITAL SCREENING BILAT W/ CAD
6 series · 6 of 6 positions shown · non-contrast
Comparison: Previous exam(s).

CLINICAL DATA: Screening.

EXAM:
DIGITAL SCREENING BILATERAL MAMMOGRAM WITH CAD

[R CC (1 of 2)]
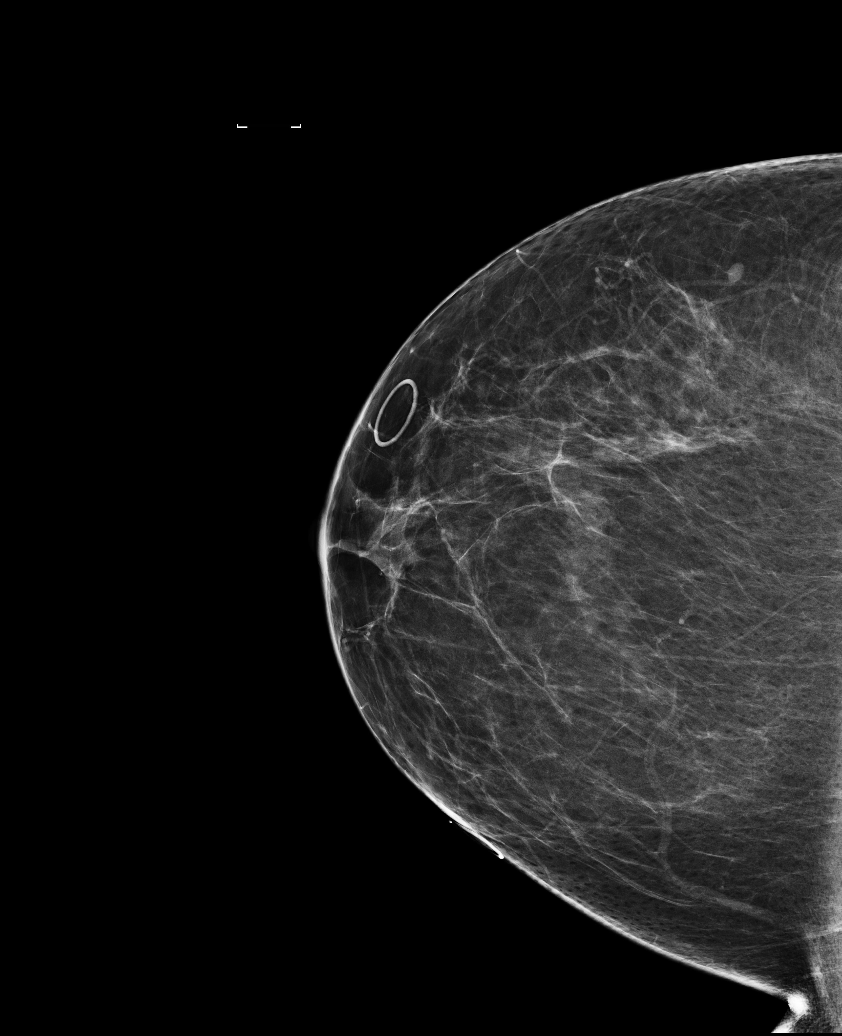

[R MLO]
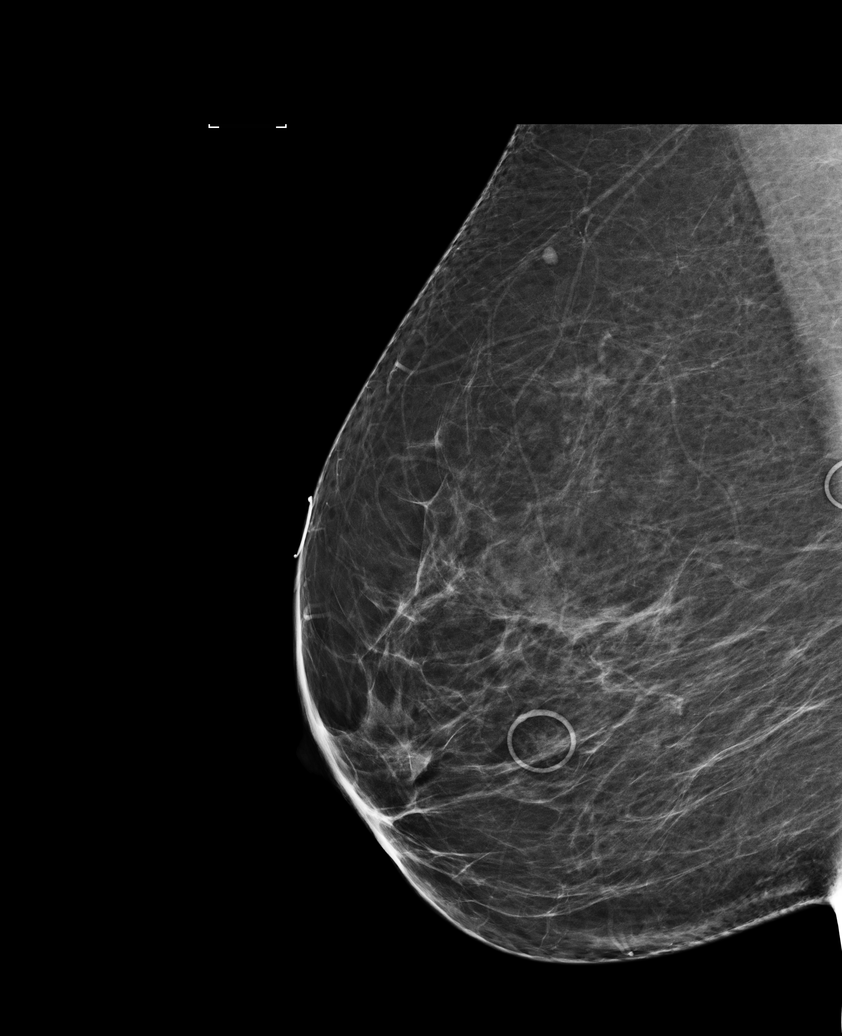

[R CC (2 of 2)]
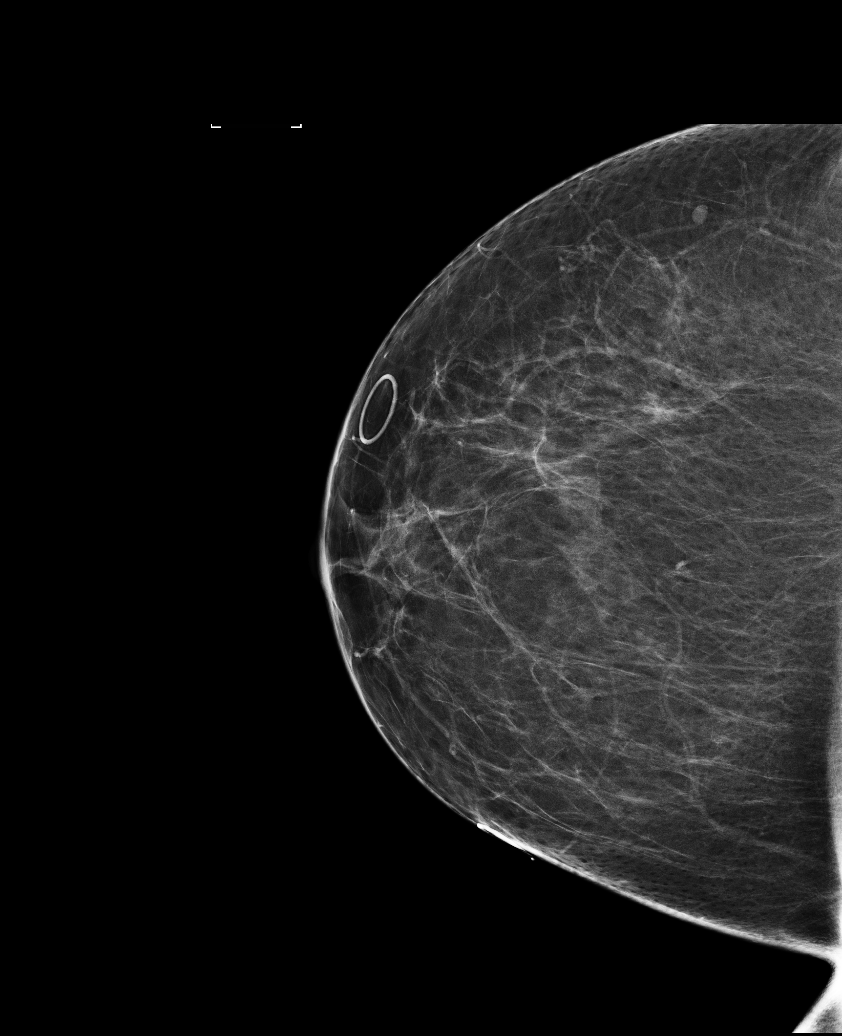

[L MLO (1 of 2)]
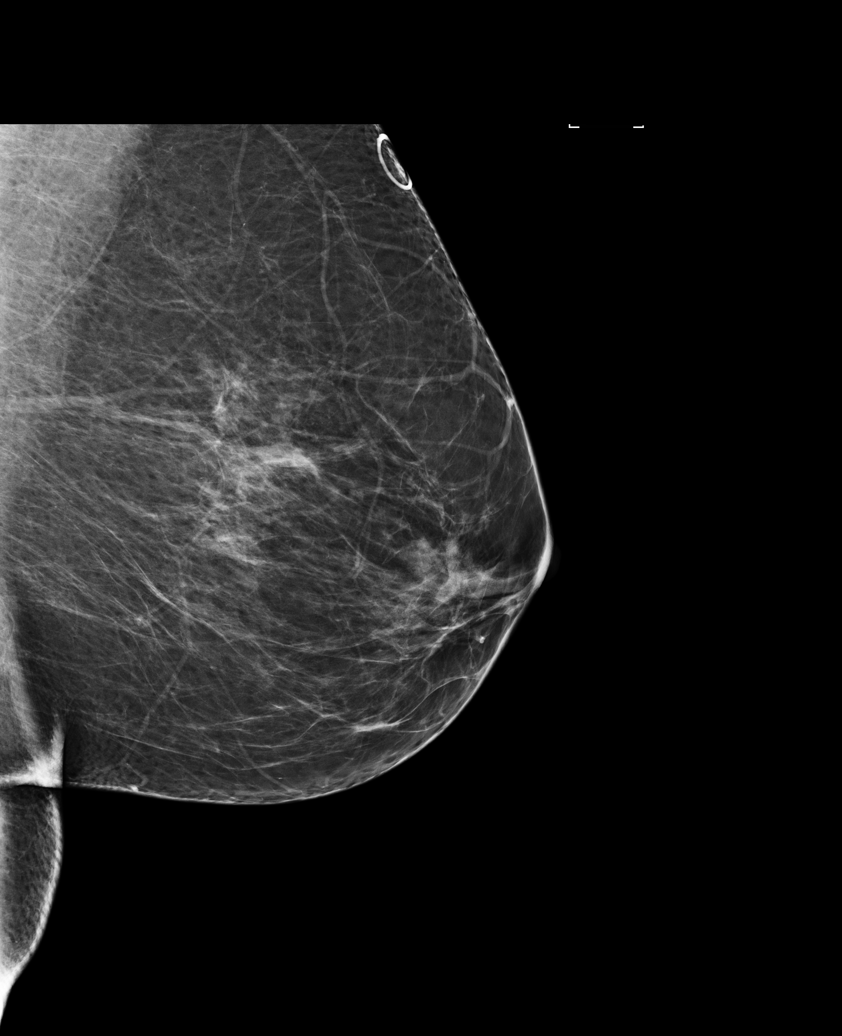

[L CC]
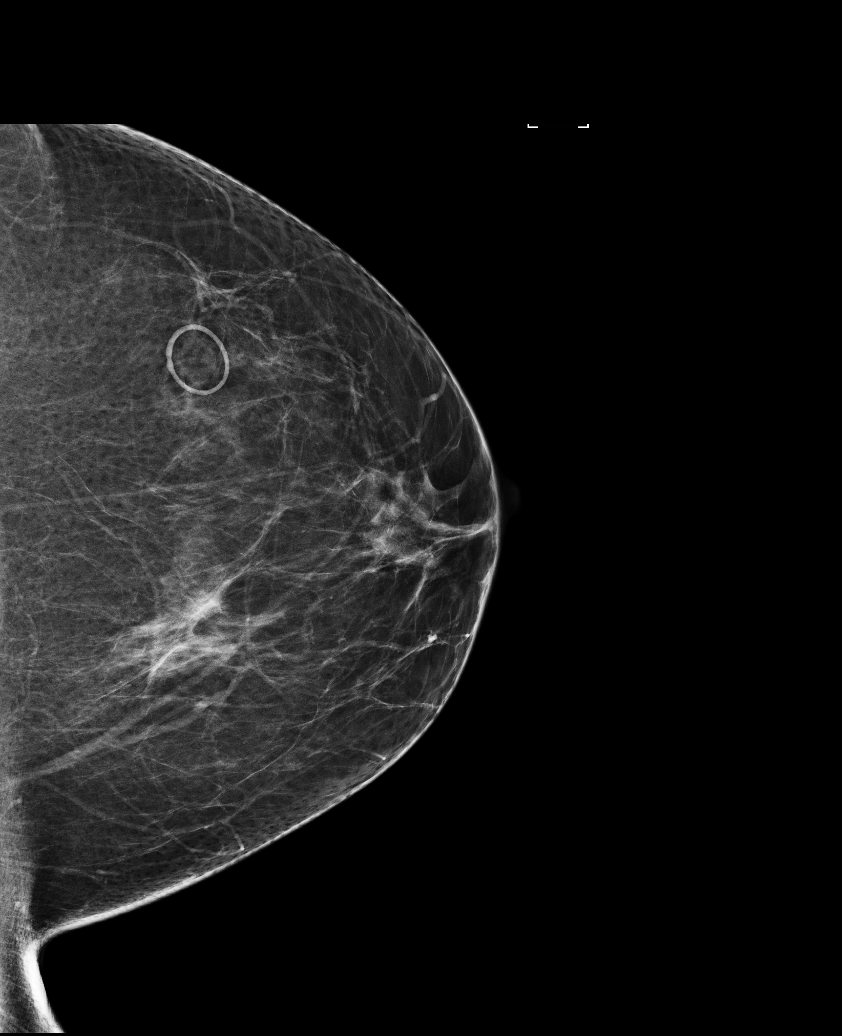

[L MLO (2 of 2)]
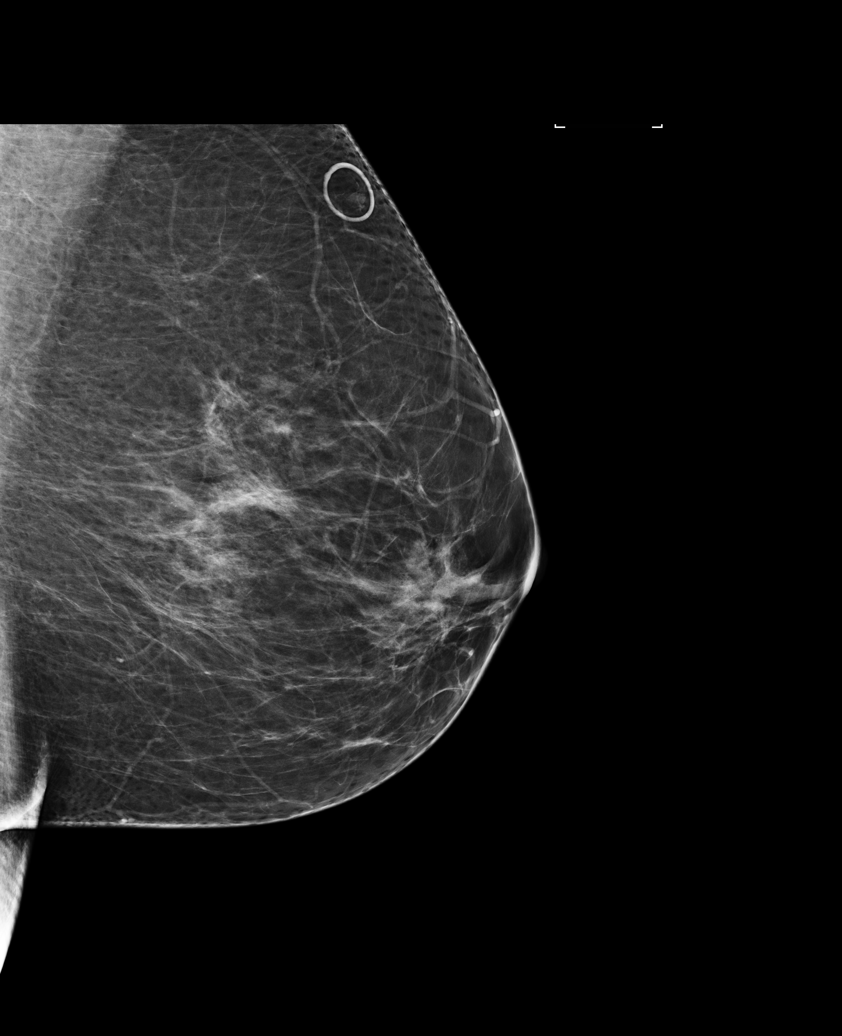

[6 of 6 positions shown; findings below may reference images not displayed]

ACR Breast Density Category b: There are scattered areas of
fibroglandular density.
FINDINGS: There are no findings suspicious for malignancy. Images were
processed with CAD.
IMPRESSION: No mammographic evidence of malignancy. A result letter of this
screening mammogram will be mailed directly to the patient.

RECOMMENDATION:
Screening mammogram in one year. (Code:AS-G-LCT)

BI-RADS CATEGORY  1: Negative.

## 2019-08-25 ENCOUNTER — Encounter: Payer: BC Managed Care – PPO | Admitting: Obstetrics and Gynecology

## 2019-09-29 ENCOUNTER — Encounter: Payer: Self-pay | Admitting: Obstetrics and Gynecology

## 2019-09-29 ENCOUNTER — Other Ambulatory Visit: Payer: Self-pay

## 2019-09-29 ENCOUNTER — Ambulatory Visit: Payer: BC Managed Care – PPO | Admitting: Obstetrics and Gynecology

## 2019-09-29 VITALS — BP 129/76 | HR 72 | Ht 65.0 in | Wt 233.0 lb

## 2019-09-29 DIAGNOSIS — N3945 Continuous leakage: Secondary | ICD-10-CM

## 2019-09-29 DIAGNOSIS — R159 Full incontinence of feces: Secondary | ICD-10-CM

## 2019-09-29 DIAGNOSIS — R635 Abnormal weight gain: Secondary | ICD-10-CM

## 2019-09-29 DIAGNOSIS — N939 Abnormal uterine and vaginal bleeding, unspecified: Secondary | ICD-10-CM | POA: Diagnosis not present

## 2019-09-29 DIAGNOSIS — R195 Other fecal abnormalities: Secondary | ICD-10-CM | POA: Diagnosis not present

## 2019-09-29 NOTE — Patient Instructions (Addendum)
Fecal Incontinence Fecal incontinence, also called accidental bowel leakage, is not being able to control your bowels. This condition happens because the nerves or muscles around the anus do not work the way they should. This affects their ability to hold stool (feces). What are the causes? This condition may be caused by:  Damage to the muscles at the end of the rectum (sphincter).  Damage to the nerves that control bowel movements.  Diarrhea.  Chronic constipation.  Pelvic floor dysfunction. This means the muscles in the pelvis do not work well.  Loss of bowel storage capacity. This occurs when the rectum can no longer stretch in size in order to store feces.  Inflammatory bowel disease (IBD), such as Crohn's disease.  Irritable bowel syndrome (IBS). What increases the risk? You are more likely to develop this condition if you:  Were born with bowels or a pelvis that did not form correctly.  Have had rectal surgery.  Have had radiation treatment for certain cancers.  Have been pregnant, had a vaginal delivery, or had surgery that damaged the pelvic floor muscles.  Had a complicated childbirth, spinal cord injury, or other trauma that caused nerve damage.  Have a condition that can affect nerve function, such as diabetes, Parkinson's disease, or multiple sclerosis.  Have a condition where the rectum drops down into the anus or vagina (prolapse).  Are 27 years of age or older. What are the signs or symptoms? The main symptom of this condition is not being able to control your bowels. You also might not be able to get to the bathroom before a bowel movement. How is this diagnosed? This condition is diagnosed with a medical history and physical exam. You may also have other tests, including:  Blood tests.  Urine tests.  A rectal exam.  Ultrasound.  MRI.  Colonoscopy. This is an exam that looks at your large intestine (colon).  Anal manometry. This is a test that  measures the strength of the anal sphincter.  Anal electromyogram (EMG). This is a test that uses small electrodes to check for nerve damage. How is this treated? Treatment for this condition depends on the cause and severity. Treatment may also focus on addressing any underlying causes of this condition. Treatment may include:  Medicines. This may include medicines to: ? Prevent diarrhea. ? Help with constipation (bulk-forming laxatives). ? Treat any underlying conditions.  Biofeedback therapy. This can help to retrain muscles that are affected.  Fiber supplements. These can help manage your bowel movements.  Nerve stimulation.  Injectable gel to promote tissue growth and better muscle control.  Surgery. You may need: ? Sphincter repair surgery. ? Diversion surgery. This procedure lets feces pass out of your body through a hole in your abdomen. Follow these instructions at home: Eating and drinking   Follow instructions from your health care provider about any eating or drinking restrictions. ? Work with a dietitian to come up with a healthy diet that will help you avoid the foods that can make your condition worse. ? Keep a diet diary to find out which foods or drinks could be making your condition worse.  Drink enough fluid to keep your urine pale yellow. Lifestyle  Do not use any products that contain nicotine or tobacco, such as cigarettes and e-cigarettes. If you need help quitting, ask your health care provider. This may help your condition.  If you are overweight, talk with your health care provider about how to safely lose weight. This may help  your condition.  Increase your physical activity as told by your health care provider. This may help your condition. Always talk with your health care provider before starting a new exercise program.  Carry a change of clothes and supplies to clean up quickly if you have an episode of fetal incontinence.  Consider joining a  fecal incontinence support group. You can find a support group online or in your local community. General instructions   Take over-the-counter and prescription medicines only as told by your health care provider. This includes any supplements.  Apply a moisture barrier, such as petroleum jelly, to your rectum. This protects the skin from irritation caused by ongoing leaking or diarrhea.  Tell your health care provider if you are upset or depressed about your condition.  Keep all follow-up visits as told by your health care provider. This is important. Where to find more information  International Foundation for Functional Gastrointestinal Disorders: iffgd.[place] [employer]: patients.gi.org Contact a health care provider if:  You have a fever.  You have redness, swelling, or pain around your rectum.  Your pain is getting worse or you lose feeling in your rectal area.  You have blood in your stool.  You feel sad or hopeless.  You avoid social or work situations. Get help right away if:  You stop having bowel movements.  You cannot eat or drink without vomiting.  You have rectal bleeding that does not stop.  You have severe pain that is getting worse.  You have symptoms of dehydration, including: ? Sleepiness or fatigue. ? Producing little or no urine, tears, or sweat. ? Dizziness. ? Dry mouth. ? Unusual irritability. ? Headache. ? Inability to think clearly. Summary  Fecal incontinence, also called accidental bowel leakage, is not being able to control your bowels. This condition happens because the nerves or muscles around the anus do not work the way they should.  Treatment varies depending on the cause and severity of your condition. Treatment may also focus on addressing any underlying causes of this condition.  Follow instructions from your health care provider about any eating or drinking restrictions, lifestyle changes, and skin  care.  Take over-the-counter and prescription medicines only as told by your health care provider. This includes any supplements.  Tell your health care provider if your symptoms worsen or if you are upset or depressed about your condition. This information is not intended to replace advice given to you by your health care provider. Make sure you discuss any questions you have with your health care provider. Document Revised: 01/23/2018 Document Reviewed: 01/23/2018 Elsevier Patient Education  [place].   Urinary Incontinence  Urinary incontinence refers to a condition in which a person is unable to control where and when to pass urine. A person with this condition will urinate when he or she does not mean to (involuntarily). What are the causes? This condition may be caused by:  Medicines.  Infections.  Constipation.  Overactive bladder muscles.  Weak bladder muscles.  Weak pelvic floor muscles. These muscles provide support for the bladder, intestine, and, in women, the uterus.  Enlarged prostate in men. The prostate is a gland near the bladder. When it gets too big, it can pinch the urethra. With the urethra blocked, the bladder can weaken and lose the ability to empty properly.  Surgery.  Emotional factors, such as anxiety, stress, or post-traumatic stress disorder (PTSD).  Pelvic organ prolapse. This happens in women when organs shift out  of place and into the vagina. This shift can prevent the bladder and urethra from working properly. What increases the risk? The following factors may make you more likely to develop this condition:  Older age.  Obesity and physical inactivity.  Pregnancy and childbirth.  Menopause.  Diseases that affect the nerves or spinal cord (neurological diseases).  Long-term (chronic) coughing. This can increase pressure on the bladder and pelvic floor muscles. What are the signs or symptoms? Symptoms may vary depending on the  type of urinary incontinence you have. They include:  A sudden urge to urinate, but passing urine involuntarily before you can get to a bathroom (urge incontinence).  Suddenly passing urine with any activity that forces urine to pass, such as coughing, laughing, exercise, or sneezing (stress incontinence).  Needing to urinate often, but urinating only a small amount, or constantly dribbling urine (overflow incontinence).  Urinating because you cannot get to the bathroom in time due to a physical disability, such as arthritis or injury, or communication and thinking problems, such as Alzheimer disease (functional incontinence). How is this diagnosed? This condition may be diagnosed based on:  Your medical history.  A physical exam.  Tests, such as: ? Urine tests. ? X-rays of your kidney and bladder. ? Ultrasound. ? CT scan. ? Cystoscopy. In this procedure, a health care provider inserts a tube with a light and camera (cystoscope) through the urethra and into the bladder in order to check for problems. ? Urodynamic testing. These tests assess how well the bladder, urethra, and sphincter can store and release urine. There are different types of urodynamic tests, and they vary depending on what the test is measuring. To help diagnose your condition, your health care provider may recommend that you keep a log of when you urinate and how much you urinate. How is this treated? Treatment for this condition depends on the type of incontinence that you have and its cause. Treatment may include:  Lifestyle changes, such as: ? Quitting smoking. ? Maintaining a healthy weight. ? Staying active. Try to get 150 minutes of moderate-intensity exercise every week. Ask your health care provider which activities are safe for you. ? Eating a healthy diet.  Avoid high-fat foods, like fried foods.  Avoid refined carbohydrates like white bread and white rice.  Limit how much alcohol and caffeine you  drink.  Increase your fiber intake. Foods such as fresh fruits, vegetables, beans, and whole grains are healthy sources of fiber.  Pelvic floor muscle exercises.  Bladder training, such as lengthening the amount of time between bathroom breaks, or using the bathroom at regular intervals.  Using techniques to suppress bladder urges. This can include distraction techniques or controlled breathing exercises.  Medicines to relax the bladder muscles and prevent bladder spasms.  Medicines to help slow or prevent the growth of a man's prostate.  Botox injections. These can help relax the bladder muscles.  Using pulses of electricity to help change bladder reflexes (electrical nerve stimulation).  For women, using a medical device to prevent urine leaks. This is a small, tampon-like, disposable device that is inserted into the urethra.  Injecting collagen or carbon beads (bulking agents) into the urinary sphincter. These can help thicken tissue and close the bladder opening.  Surgery. Follow these instructions at home: Lifestyle  Limit alcohol and caffeine. These can fill your bladder quickly and irritate it.  Keep yourself clean to help prevent odors and skin damage. Ask your doctor about special skin creams and cleansers  that can protect the skin from urine.  Consider wearing pads or adult diapers. Make sure to change them regularly, and always change them right after experiencing incontinence. General instructions  Take over-the-counter and prescription medicines only as told by your health care provider.  Use the bathroom about every 3-4 hours, even if you do not feel the need to urinate. Try to empty your bladder completely every time. After urinating, wait a minute. Then try to urinate again.  Make sure you are in a relaxed position while urinating.  If your incontinence is caused by nerve problems, keep a log of the medicines you take and the times you go to the  bathroom.  Keep all follow-up visits as told by your health care provider. This is important. Contact a health care provider if:  You have pain that gets worse.  Your incontinence gets worse. Get help right away if:  You have a fever or chills.  You are unable to urinate.  You have redness in your groin area or down your legs. Summary  Urinary incontinence refers to a condition in which a person is unable to control where and when to pass urine.  This condition may be caused by medicines, infection, weak bladder muscles, weak pelvic floor muscles, enlargement of the prostate (in men), or surgery.  The following factors increase your risk for developing this condition: older age, obesity, pregnancy and childbirth, menopause, neurological diseases, and chronic coughing.  There are several types of urinary incontinence. They include urge incontinence, stress incontinence, overflow incontinence, and functional incontinence.  This condition is usually treated first with lifestyle and behavioral changes, such as quitting smoking, eating a healthier diet, and doing regular pelvic floor exercises. Other treatment options include medicines, bulking agents, medical devices, electrical nerve stimulation, or surgery. This information is not intended to replace advice given to you by your health care provider. Make sure you discuss any questions you have with your health care provider. Document Revised: 09/20/2017 Document Reviewed: 12/20/2016 Elsevier Patient Education  [place].

## 2019-09-29 NOTE — Progress Notes (Signed)
GYNECOLOGY PROGRESS NOTE  Subjective:    Patient ID: Nicole Osborn, female    DOB: [birth year], 49 y.o.   MRN: [id number]  HPI  Patient is a 49 y.o. female who presented as a referral from her PCP for postmenopausal bleeding.  Patient notes that she had gone through menopause in 2017. She then had an episode of bleeding in October 2019, with initial workup normal (ultrasound).  She did not have another episode of bleeding until October 2020.  Episodes are usually light, lasting 4-5 days. Most recent lab work noted that patient was not in menopause. Patient was referred to GYN for further evaluation.   Secondly, Jovonda complains of fecal and urinary incontinence.  She notes that she began leaking stool 3-4 months ago (explosive diarrhea after first meal, intermittent fecal soiling occasionally). She has not had any recent travel or sick contacts. Never had a colonoscopy. Had history of hemorrhoids. Sometimes feels as though something comes down out of her rectum when having a bowel movement. Gall bladder removed 1 year ago. The urinary leakage has been ongoing for ~ 4-5 years (has tried a few things such as Kegel exercise, physical therapy).    Lastly, patient reports issues with weight gain. Notes that she is an emotional eater and that is a good portion of her issue. Desires a referral for someone who can help.    Gynecologic History:  Menarche: age 31 or 55.  Last pap smear: 03/26/2018. Results were: normal.  Last colonoscopy: patient has never had one Last mammogram: 09/11/2017   Past Medical History:  Diagnosis Date  . Anxiety   . Arthritis   . Cholecystitis with cholelithiasis 09/19/2017  . Chronic venous insufficiency of lower extremity   . Depression   . Elevated blood pressure reading    no meds  . GERD (gastroesophageal reflux disease)    occ  . Heartburn   . History of kidney stones    h/o  . Hyperlipidemia   . Hypothyroidism   . Migraine 09/23/2017  . Migraines    without aura  . Obesity   . Thyroid disease   . Varicose veins of both lower extremities 09/06/2017    Family History  Problem Relation Age of Onset  . Depression Mother   . Arthritis Mother   . Stroke Father   . Hypertension Father   . Breast cancer Paternal Aunt 70  . Emphysema Maternal Grandmother   . Emphysema Maternal Grandfather   . Dementia Paternal Grandmother   . Asthma Son   . Eczema Son   . Kidney disease Neg Hx   . Bladder Cancer Neg Hx      Past Surgical History:  Procedure Laterality Date  . CHOLECYSTECTOMY N/A 10/08/2017   Procedure: LAPAROSCOPIC CHOLECYSTECTOMY;  Surgeon: Florene Glen, MD;  Location: [facility] ORS;  Service: General;  Laterality: N/A;  . Removal of extra tooth     age 19    Social History   Socioeconomic History  . Marital status: Married    Spouse name: Not on file  . Number of children: Not on file  . Years of education: Not on file  . Highest education level: Not on file  Occupational History  . Not on file  Tobacco Use  . Smoking status: Never Smoker  . Smokeless tobacco: Never Used  Substance and Sexual Activity  . Alcohol use: No  . Drug use: No  . Sexual activity: Yes    Birth control/protection: Surgical, Other-see  comments    Comment: Husband with vastectomy   Other Topics Concern  . Not on file  Social History Narrative  . Not on file   Social Determinants of Health   Financial Resource Strain:   . Difficulty of Paying Living Expenses: Not on file  Food Insecurity:   . Worried About Charity fundraiser in the Last Year: Not on file  . Ran Out of Food in the Last Year: Not on file  Transportation Needs:   . Lack of Transportation (Medical): Not on file  . Lack of Transportation (Non-Medical): Not on file  Physical Activity:   . Days of Exercise per Week: Not on file  . Minutes of Exercise per Session: Not on file  Stress:   . Feeling of Stress : Not on file  Social Connections:   . Frequency of  Communication with Friends and Family: Not on file  . Frequency of Social Gatherings with Friends and Family: Not on file  . Attends Religious Services: Not on file  . Active Member of Clubs or Organizations: Not on file  . Attends Archivist Meetings: Not on file  . Marital Status: Not on file  Intimate Partner Violence:   . Fear of Current or Ex-Partner: Not on file  . Emotionally Abused: Not on file  . Physically Abused: Not on file  . Sexually Abused: Not on file    Current Outpatient Medications on File Prior to Visit  Medication Sig Dispense Refill  . calcium carbonate (OS-CAL) 1250 (500 Ca) MG chewable tablet Chew by mouth.    . calcium carbonate (TUMS - DOSED IN MG ELEMENTAL CALCIUM) 500 MG chewable tablet Chew 2-3 tablets by mouth 2 (two) times daily as needed for indigestion or heartburn.    . Cholecalciferol (VITAMIN D3) 2000 units capsule Take 2,000 Units by mouth daily.     . DULoxetine (CYMBALTA) 20 MG capsule Take 2 capsules (40 mg total) by mouth daily. 180 capsule 1  . hydrochlorothiazide (MICROZIDE) 12.5 MG capsule TAKE 1 CAPSULE BY MOUTH EVERY DAY 90 capsule 1  . levothyroxine (SYNTHROID) 112 MCG tablet Take 1 tablet (112 mcg total) by mouth daily before breakfast. 90 tablet 1  . Magnesium 250 MG TABS Take 250 mg by mouth at bedtime.     Marland Kitchen MAGNESIUM PO Take by mouth.    . Multiple Vitamin (MULTI-VITAMINS) TABS Take 1 tablet by mouth daily.     . Multiple Vitamins-Iron (CHLORELLA PO) Take by mouth.    . OREGANO PO Take 150 capsules by mouth daily as needed (immune support).    . promethazine (PHENERGAN) 25 MG suppository USE ONE SUPPOSITORY RECTALLY EVERY 6 HOURS AS NEEDED FOR NAUSEA FOR UP TO 7 DAYS 12 each 2  . SUMAtriptan (IMITREX) 100 MG tablet May repeat in 2 hours if headache persists or recurs. 12 tablet 1  . Turmeric 500 MG CAPS Take 500 mg by mouth daily.     No current facility-administered medications on file prior to visit.    Allergies    Allergen Reactions  . Cefaclor Swelling    Lips swelling     Review of Systems Pertinent items noted in HPI and remainder of comprehensive ROS otherwise negative.   Objective:   Blood pressure 129/76, pulse 72, height 5\' 5"  (1.651 m), weight 233 lb (105.7 kg), last menstrual period 07/12/2016. Body mass index is 38.77 kg/m.  General appearance: alert and no distress. Moderate obesity Abdomen: soft, non-tender; bowel sounds normal; no  masses,  no organomegaly Pelvic: external genitalia normal, rectovaginal septum normal.  Vagina without discharge. No cystocele or rectocele present. Cervix normal appearing, no lesions and no motion tenderness.  Uterus mobile, nontender, normal shape and size.  Adnexae non-palpable, nontender bilaterally.  Rectal: small external hemorrhoid present.  Internal exam with no palpable masses, non-tender.  Neurologic: Grossly normal   Labs:   Lab Results  Component Value Date   TSH 0.63 07/17/2019   Results for NEZIAH, SCHEIBEL (MRN [id number]) as of 10/04/2019 19:43  Ref. Range 04/23/2016 15:21 07/17/2019 00:00  LH Latest Units: mIU/mL 33.8 5.9  FSH Latest Units: mIU/mL 64.3 17.2   Assessment:   Abnormal uterine bleeding  Loose stools  Continuous leakage of urine Fecal soiling due to fecal incontinence Weight gain  Plan:   1. Abnormal uterine bleeding - Reviewed labs with patient.  Patient with h/o menopause, as noted by labs and absence of cycles 3 years ago, however current labs note that patient is no longer in menopause. Advised that few patients may have episodic periods of reactivity after menopause, leading to resumption of occasional menstrual cycles.  Ultrasound in 2019 normal. Will repeat ultrasound to assess for any new pathology for alternative cause of bleeding (new polyp, fibroid, etc). If ultrasound otherwise normal, can follow up as needed.  2. Loose stools and fecal soiling - unclear cause. Sphincter tone normal on today's exam.  Patient notes she would like to be worked up for IBS and celiac disease. Will refer to GI. Also is due for a colonoscopy based on age-based screening guidelines. May also offer some insight into patient's issues.  3. Urinary incontinence - has been ongoing for several years.  Has tried several interventions (physical therapy, Kegel exercises) in the past. Discussed other options including use of Poise Impressa OTC inserts, pessary, or surgical intervention (possible sling placement).  Patient notes she will think about trying the OTC inserts. Also discussed weight as a factor in incontinence.   4. Weight gain - patient notes issues with emotional eating.  Desires referral. Given handouts for several local therapists.    A total of 45 minutes were spent face-to-face with the patient during the encounter with greater than 50% dealing with counseling and coordination of care.    Rubie Maid, MD [facility]

## 2019-09-29 NOTE — Progress Notes (Signed)
Pt present due to a referral fo post menopausal bleeding. Pt stated that she noticed the first bleeding Sept 2019. Pt stated that it happen again this past year 2020 in Sept. Pt stated that light cycle that last about 5-6 days. Pt stated having loss bowels and unable to hold them and have leakage also urine leakage.

## 2019-10-13 ENCOUNTER — Other Ambulatory Visit: Payer: Self-pay

## 2019-10-13 ENCOUNTER — Telehealth (INDEPENDENT_AMBULATORY_CARE_PROVIDER_SITE_OTHER): Payer: BC Managed Care – PPO | Admitting: Family Medicine

## 2019-10-13 DIAGNOSIS — R05 Cough: Secondary | ICD-10-CM | POA: Diagnosis not present

## 2019-10-13 DIAGNOSIS — R053 Chronic cough: Secondary | ICD-10-CM

## 2019-10-13 DIAGNOSIS — R042 Hemoptysis: Secondary | ICD-10-CM

## 2019-10-13 MED ORDER — ANORO ELLIPTA 62.5-25 MCG/INH IN AEPB: 1.0000 | INHALATION_SPRAY | Freq: Every day | RESPIRATORY_TRACT | 0 refills | Status: DC

## 2019-10-13 MED ORDER — AZITHROMYCIN 250 MG PO TABS: ORAL_TABLET | ORAL | 0 refills | Status: DC

## 2019-10-13 NOTE — Progress Notes (Signed)
Name: Nicole Osborn   MRN: [id number]    DOB: Nov 06, 1970   Date:10/13/2019       Progress Note  Subjective  Chief Complaint  Chief Complaint  Patient presents with  . Cough    She has been coughing up some blood. She has had a cough since March. She is most bothered by cough when she lays down, wears her mask or talks alot. In the morning when she wakes up she has alot of mucous and it has blood in it.   Marland Kitchen Thumb Pain    She has bilateral thumb pain x 1 year. She has been evaluated     I connected with  [name]  on 10/13/19 at 10:00 AM EST by a video enabled telemedicine application and verified that I am speaking with the correct person using two identifiers.  I discussed the limitations of evaluation and management by telemedicine and the availability of in person appointments. The patient expressed understanding and agreed to proceed. Staff also discussed with the patient that there may be a patient responsible charge related to this service. Patient Location: at home  Provider Location: [facility]    HPI  Cough: she states she has recurrent cough , she states she had bronchitis for many years. She was seen by Dr. Sanda Klein 08/2018 with four weeks of cough, CXR was normal. She states episodes comes and goes, but has a weekly cough since March 2020. This episode started is different, she feels congested, wakes up feeling like she has to spit out and saw yellow phlegm and some streaks of blood and once bright blood for three days but today sputum was clear  , it has improved. No fever, chills. Cough is very mild, more like post-nasal  Drainage, she has some nasal congestion, no headaches or facial pressure. No wheezing , SOB or orthopnea. No nausea, vomiting or change in appetite. 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   Sore thumbs: she also has ganglion cyst , she states symptoms are  intermittent and present for over one year, worse with activity. She will hold off to see Ortho at this time.   Patient Active Problem List   Diagnosis Date Noted  . Mild episode of recurrent major depressive disorder ([place]) 04/20/2019  . Urinary incontinence in female 09/01/2018  . Prediabetes 07/25/2018  . Chronic venous insufficiency 07/15/2018  . Lymphedema 07/15/2018  . History of gallbladder disease 02/06/2018  . Morbid obesity ([place]) 02/06/2018  . Bilateral lower extremity edema 01/01/2018  . Depression 09/23/2017  . Hypertension 09/23/2017  . Migraine 09/23/2017  . Varicose veins of both lower extremities 09/06/2017  . Hyperlipidemia 12/23/2016  . Anxiety 12/23/2016  . Lung nodule 12/11/2016  . Increased frequency of headaches 04/28/2016  . Swelling of left ankle joint 04/23/2016  . Hypothyroidism, unspecified 04/23/2016    Past Surgical History:  Procedure Laterality Date  . CHOLECYSTECTOMY N/A 10/08/2017   Procedure: LAPAROSCOPIC CHOLECYSTECTOMY;  Surgeon: Florene Glen, MD;  Location: [facility] ORS;  Service: General;  Laterality: N/A;  . Removal of extra tooth     age 13    Family History  Problem Relation Age of Onset  . Depression Mother   . Arthritis Mother   . Stroke Father   . Hypertension Father   . Breast cancer Paternal Aunt 63  . Emphysema Maternal Grandmother   . Emphysema  Maternal Grandfather   . Dementia Paternal Grandmother   . Asthma Son   . Eczema Son   . Kidney disease Neg Hx   . Bladder Cancer Neg Hx      Current Outpatient Medications:  .  calcium carbonate (OS-CAL) 1250 (500 Ca) MG chewable tablet, Chew by mouth., Disp: , Rfl:  .  calcium carbonate (TUMS - DOSED IN MG ELEMENTAL CALCIUM) 500 MG chewable tablet, Chew 2-3 tablets by mouth 2 (two) times daily as needed for indigestion or heartburn., Disp: , Rfl:  .  Cholecalciferol (VITAMIN D3) 2000 units capsule, Take 2,000 Units by mouth daily. , Disp: , Rfl:  .  DULoxetine (CYMBALTA) 20  MG capsule, Take 2 capsules (40 mg total) by mouth daily., Disp: 180 capsule, Rfl: 1 .  hydrochlorothiazide (MICROZIDE) 12.5 MG capsule, TAKE 1 CAPSULE BY MOUTH EVERY DAY, Disp: 90 capsule, Rfl: 1 .  levothyroxine (SYNTHROID) 112 MCG tablet, Take 1 tablet (112 mcg total) by mouth daily before breakfast., Disp: 90 tablet, Rfl: 1 .  Magnesium 250 MG TABS, Take 250 mg by mouth at bedtime. , Disp: , Rfl:  .  MAGNESIUM PO, Take by mouth., Disp: , Rfl:  .  Multiple Vitamin (MULTI-VITAMINS) TABS, Take 1 tablet by mouth daily. , Disp: , Rfl:  .  Multiple Vitamins-Iron (CHLORELLA PO), Take by mouth., Disp: , Rfl:  .  OREGANO PO, Take 150 capsules by mouth daily as needed (immune support)., Disp: , Rfl:  .  promethazine (PHENERGAN) 25 MG suppository, USE ONE SUPPOSITORY RECTALLY EVERY 6 HOURS AS NEEDED FOR NAUSEA FOR UP TO 7 DAYS, Disp: 12 each, Rfl: 2 .  SUMAtriptan (IMITREX) 100 MG tablet, May repeat in 2 hours if headache persists or recurs., Disp: 12 tablet, Rfl: 1 .  Turmeric 500 MG CAPS, Take 500 mg by mouth daily., Disp: , Rfl:   Allergies  Allergen Reactions  . Cefaclor Swelling    Lips swelling     I personally reviewed active problem list, medication list, allergies, family history, social history with the patient/caregiver today.   ROS  Ten systems reviewed and is negative except as mentioned in HPI   Objective  Virtual encounter, vitals not obtained.  There is no height or weight on file to calculate BMI.  Physical Exam  Awake, alert and oriented , sounds nasally   PHQ2/9: Depression screen [facility] 2/9 10/13/2019 07/17/2019 07/02/2019 04/20/2019 11/27/2018  Decreased Interest 1 0 0 0 1  Down, Depressed, Hopeless 0 0 0 0 1  PHQ - 2 Score 1 0 0 0 2  Altered sleeping - 0 0 0 1  Tired, decreased energy - 0 0 0 2  Change in appetite - 0 0 0 2  Feeling bad or failure about yourself  - 0 0 0 1  Trouble concentrating - 0 0 0 1  Moving slowly or fidgety/restless - 0 0 0 0  Suicidal  thoughts - 0 0 0 0  PHQ-9 Score - 0 0 0 9  Difficult doing work/chores - Not difficult at all Not difficult at all Not difficult at all Somewhat difficult  Some recent data might be hidden   PHQ-2/9 Result is negative.    Fall Risk: Fall Risk  10/13/2019 07/17/2019 07/02/2019 04/20/2019 11/27/2018  Falls in the past year? 0 0 0 0 0  Number falls in past yr: 0 0 0 0 -  Injury with Fall? 0 0 0 0 -  Comment - - - - -  Follow up -  Falls evaluation completed - - -    Assessment & Plan  1. Bloody sputum   - umeclidinium-vilanterol (ANORO ELLIPTA) 62.5-25 MCG/INH AEPB; Inhale 1 puff into the lungs daily.  Dispense: 60 each; Refill: 0 - CBC with Differential/Platelet; Future - QuantiFERON-TB Gold Plus; Future - azithromycin (ZITHROMAX) 250 MG tablet; Take as directed  Dispense: 6 tablet; Refill: 0 - Comprehensive metabolic panel; Future She had a CXR about one year ago, seems to be more URI, possible sinusitis, we will check labs, she will hold off on getting azithromycin until labs back or if no improvement of symptoms  2. Chronic cough  - umeclidinium-vilanterol (ANORO ELLIPTA) 62.5-25 MCG/INH AEPB; Inhale 1 puff into the lungs daily.  Dispense: 60 each; Refill: 0 - CBC with Differential/Platelet; Future - QuantiFERON-TB Gold Plus; Future - azithromycin (ZITHROMAX) 250 MG tablet; Take as directed  Dispense: 6 tablet; Refill: 0 - Comprehensive metabolic panel; Future I discussed the assessment and treatment plan with the patient. The patient was provided an opportunity to ask questions and all were answered. The patient agreed with the plan and demonstrated an understanding of the instructions.  The patient was advised to call back or seek an in-person evaluation if the symptoms worsen or if the condition fails to improve as anticipated.  I provided 25  minutes of non-face-to-face time during this encounter.

## 2019-10-14 ENCOUNTER — Other Ambulatory Visit
Admission: RE | Admit: 2019-10-14 | Discharge: 2019-10-14 | Disposition: A | Discharge status: Home / Self Care | Payer: BC Managed Care – PPO | Source: Ambulatory Visit | Attending: Family Medicine | Admitting: Family Medicine

## 2019-10-14 DIAGNOSIS — R053 Chronic cough: Secondary | ICD-10-CM

## 2019-10-14 DIAGNOSIS — R05 Cough: Secondary | ICD-10-CM | POA: Insufficient documentation

## 2019-10-14 DIAGNOSIS — R042 Hemoptysis: Secondary | ICD-10-CM | POA: Diagnosis not present

## 2019-10-14 LAB — COMPREHENSIVE METABOLIC PANEL
ALT: 14 U/L (ref 0–44)
AST: 16 U/L (ref 15–41)
Albumin: 4 g/dL (ref 3.5–5.0)
Alkaline Phosphatase: 82 U/L (ref 38–126)
Anion gap: 9 (ref 5–15)
BUN: 15 mg/dL (ref 6–20)
CO2: 27 mmol/L (ref 22–32)
Calcium: 9.2 mg/dL (ref 8.9–10.3)
Chloride: 106 mmol/L (ref 98–111)
Creatinine, Ser: 0.63 mg/dL (ref 0.44–1.00)
GFR calc Af Amer: >60 mL/min (ref >60)
GFR calc non Af Amer: >60 mL/min (ref >60)
Glucose, Bld: 106 mg/dL — ABNORMAL HIGH (ref 70–99)
Potassium: 3.9 mmol/L (ref 3.5–5.1)
Sodium: 142 mmol/L (ref 135–145)
Total Bilirubin: 0.7 mg/dL (ref 0.3–1.2)
Total Protein: 7.8 g/dL (ref 6.5–8.1)

## 2019-10-14 LAB — CBC WITH DIFFERENTIAL/PLATELET
Abs Immature Granulocytes: 0.03 10*3/uL (ref 0.00–0.07)
Basophils Absolute: 0.1 10*3/uL (ref 0.0–0.1)
Basophils Relative: 1 %
Eosinophils Absolute: 0.2 10*3/uL (ref 0.0–0.5)
Eosinophils Relative: 3 %
HCT: 40.4 % (ref 36.0–46.0)
Hemoglobin: 13.5 g/dL (ref 12.0–15.0)
Immature Granulocytes: 0 %
Lymphocytes Relative: 28 %
Lymphs Abs: 2.1 10*3/uL (ref 0.7–4.0)
MCH: 29.7 pg (ref 26.0–34.0)
MCHC: 33.4 g/dL (ref 30.0–36.0)
MCV: 89 fL (ref 80.0–100.0)
Monocytes Absolute: 0.6 10*3/uL (ref 0.1–1.0)
Monocytes Relative: 8 %
Neutro Abs: 4.6 10*3/uL (ref 1.7–7.7)
Neutrophils Relative %: 60 %
Platelets: 228 10*3/uL (ref 150–400)
RBC: 4.54 MIL/uL (ref 3.87–5.11)
RDW: 12.3 % (ref 11.5–15.5)
WBC: 7.7 10*3/uL (ref 4.0–10.5)
nRBC: 0 % (ref 0.0–0.2)

## 2019-10-16 ENCOUNTER — Telehealth: Payer: Self-pay

## 2019-10-16 ENCOUNTER — Encounter: Payer: Self-pay | Admitting: Family Medicine

## 2019-10-16 LAB — QUANTIFERON-TB GOLD PLUS (RQFGPL)
QuantiFERON Mitogen Value: 8.4 IU/mL
QuantiFERON Nil Value: 0.04 IU/mL
QuantiFERON TB1 Ag Value: 0.03 IU/mL
QuantiFERON TB2 Ag Value: 0.04 IU/mL

## 2019-10-16 LAB — QUANTIFERON-TB GOLD PLUS: QuantiFERON-TB Gold Plus: NEGATIVE

## 2019-10-16 NOTE — Telephone Encounter (Signed)
Copied from [place] [id number]. Topic: General - Inquiry >> Oct 16, 2019 11:13 AM Richardo Priest, NT wrote: Reason for CRM: Pt called in stating she would like to discuss the lab results with a nurse to see if she needs the antibiotic. Please advise.

## 2019-10-27 ENCOUNTER — Other Ambulatory Visit: Payer: Self-pay

## 2019-10-27 ENCOUNTER — Encounter: Payer: Self-pay | Admitting: Intensive Care

## 2019-10-27 ENCOUNTER — Emergency Department
Admission: EM | Admit: 2019-10-27 | Discharge: 2019-10-27 | Disposition: A | Discharge status: Home / Self Care | Payer: BC Managed Care – PPO | Attending: Emergency Medicine | Admitting: Emergency Medicine

## 2019-10-27 ENCOUNTER — Emergency Department: Payer: BC Managed Care – PPO

## 2019-10-27 DIAGNOSIS — I10 Essential (primary) hypertension: Secondary | ICD-10-CM | POA: Insufficient documentation

## 2019-10-27 DIAGNOSIS — Z79899 Other long term (current) drug therapy: Secondary | ICD-10-CM | POA: Diagnosis not present

## 2019-10-27 DIAGNOSIS — E039 Hypothyroidism, unspecified: Secondary | ICD-10-CM | POA: Diagnosis not present

## 2019-10-27 DIAGNOSIS — H538 Other visual disturbances: Secondary | ICD-10-CM | POA: Insufficient documentation

## 2019-10-27 DIAGNOSIS — R202 Paresthesia of skin: Secondary | ICD-10-CM | POA: Insufficient documentation

## 2019-10-27 DIAGNOSIS — H539 Unspecified visual disturbance: Secondary | ICD-10-CM

## 2019-10-27 LAB — DIFFERENTIAL
Abs Immature Granulocytes: 0.02 10*3/uL (ref 0.00–0.07)
Basophils Absolute: 0.1 10*3/uL (ref 0.0–0.1)
Basophils Relative: 2 %
Eosinophils Absolute: 0.3 10*3/uL (ref 0.0–0.5)
Eosinophils Relative: 4 %
Immature Granulocytes: 0 %
Lymphocytes Relative: 35 %
Lymphs Abs: 2.2 10*3/uL (ref 0.7–4.0)
Monocytes Absolute: 0.6 10*3/uL (ref 0.1–1.0)
Monocytes Relative: 9 %
Neutro Abs: 3.2 10*3/uL (ref 1.7–7.7)
Neutrophils Relative %: 50 %

## 2019-10-27 LAB — COMPREHENSIVE METABOLIC PANEL
ALT: 18 U/L (ref 0–44)
AST: 20 U/L (ref 15–41)
Albumin: 4.1 g/dL (ref 3.5–5.0)
Alkaline Phosphatase: 78 U/L (ref 38–126)
Anion gap: 7 (ref 5–15)
BUN: 13 mg/dL (ref 6–20)
CO2: 25 mmol/L (ref 22–32)
Calcium: 9.4 mg/dL (ref 8.9–10.3)
Chloride: 107 mmol/L (ref 98–111)
Creatinine, Ser: 0.69 mg/dL (ref 0.44–1.00)
GFR calc Af Amer: >60 mL/min (ref >60)
GFR calc non Af Amer: >60 mL/min (ref >60)
Glucose, Bld: 114 mg/dL — ABNORMAL HIGH (ref 70–99)
Potassium: 3.8 mmol/L (ref 3.5–5.1)
Sodium: 139 mmol/L (ref 135–145)
Total Bilirubin: 0.4 mg/dL (ref 0.3–1.2)
Total Protein: 7.4 g/dL (ref 6.5–8.1)

## 2019-10-27 LAB — CBC
HCT: 39.9 % (ref 36.0–46.0)
Hemoglobin: 13 g/dL (ref 12.0–15.0)
MCH: 29.1 pg (ref 26.0–34.0)
MCHC: 32.6 g/dL (ref 30.0–36.0)
MCV: 89.3 fL (ref 80.0–100.0)
Platelets: 245 10*3/uL (ref 150–400)
RBC: 4.47 MIL/uL (ref 3.87–5.11)
RDW: 12.5 % (ref 11.5–15.5)
WBC: 6.3 10*3/uL (ref 4.0–10.5)
nRBC: 0 % (ref 0.0–0.2)

## 2019-10-27 LAB — SEDIMENTATION RATE: Sed Rate: 35 mm/hr — ABNORMAL HIGH (ref 0–30)

## 2019-10-27 LAB — APTT: aPTT: 31 seconds (ref 24–36)

## 2019-10-27 LAB — PROTIME-INR
INR: 1 (ref 0.8–1.2)
Prothrombin Time: 13.5 seconds (ref 11.4–15.2)

## 2019-10-27 MED ORDER — DIPHENHYDRAMINE HCL 50 MG/ML IJ SOLN
25.0000 mg | Freq: Once | INTRAMUSCULAR | Status: AC
  Administered 2019-10-27: 25 mg via INTRAVENOUS
  Filled 2019-10-27: qty 1

## 2019-10-27 MED ORDER — PREDNISONE 20 MG PO TABS: 60.0000 mg | ORAL_TABLET | Freq: Every day | ORAL | 0 refills | Status: AC

## 2019-10-27 MED ORDER — PREDNISONE 20 MG PO TABS
60.0000 mg | ORAL_TABLET | Freq: Once | ORAL | Status: AC
  Administered 2019-10-27: 60 mg via ORAL
  Filled 2019-10-27: qty 3

## 2019-10-27 MED ORDER — TETRACAINE HCL 0.5 % OP SOLN
1.0000 [drp] | Freq: Once | OPHTHALMIC | Status: AC
  Administered 2019-10-27: 1 [drp] via OPHTHALMIC
  Filled 2019-10-27: qty 4

## 2019-10-27 MED ORDER — FLUORESCEIN SODIUM 1 MG OP STRP
1.0000 | ORAL_STRIP | Freq: Once | OPHTHALMIC | Status: AC
  Administered 2019-10-27: 1 via OPHTHALMIC
  Filled 2019-10-27: qty 1

## 2019-10-27 MED ORDER — METOCLOPRAMIDE HCL 5 MG/ML IJ SOLN
10.0000 mg | Freq: Once | INTRAMUSCULAR | Status: AC
  Administered 2019-10-27: 10 mg via INTRAVENOUS
  Filled 2019-10-27: qty 2

## 2019-10-27 NOTE — ED Notes (Signed)
Pt to MRI

## 2019-10-27 NOTE — ED Notes (Addendum)
Pt states she woke up yesterday at 0600 AM with bad headache that resolved in 2 hours, hx migraines. Now c./o pain behind her right eye that started yesterday morning approx 0800 AM. Went to dentist yesterday, states she had injections/ shots in jaw/mouth, started having pain throughout right jaw/head.  C/o pins/needles feeling on right side of head that started today, pain worsens with touch. Pins/ needles feeling also in right eye area as well as blurred vision in right eye.   Face symmetrical. Equal grip and strength in BUE and BLE  Pt also reporting blood in stool that is bright red X2 weeks. Unsure of hx hemorrhoids.

## 2019-10-27 NOTE — Discharge Instructions (Signed)
Call the ophthalmology office tomorrow morning to schedule a follow-up visit within the next few days.  Let them know that you were seen in the ER and need follow-up.  Although your symptoms are likely not related to temporal or giant cell arteritis, we have put you on a steroid course until you follow-up with the ophthalmologist.  You should return to the ER immediately for new, worsening, or persistent severe blurred vision, loss of vision, numbness, weakness, severe headache, vomiting, or any other new or worsening symptoms that concern you.

## 2019-10-27 NOTE — ED Provider Notes (Addendum)
[facility] Emergency Department Provider Note ____________________________________________   First MD Initiated Contact with Patient 10/27/19 2040     (approximate)  I have reviewed the triage vital signs and the nursing notes.   HISTORY  Chief Complaint Blurred Vision    HPI Nicole Osborn is a 49 y.o. female with PMH as noted below who presents primarily with blurred vision since around 2 or 3 PM today, gradual onset, persistent course.  Is not associated with any swirling, movement, flashes, or other scotoma.  The patient states that she woke up yesterday with a migraine characterized by right-sided headache that was resolved with sumatriptan after a few hours and was otherwise typical of her prior migraines.  She then had some dental work and had local anesthetic injected on the right side.  Subsequently she experienced some right-sided TMJ area pain, and then today developed a pins-and-needles sensation in her right forehead, eyelid, and upper maxillary area along with the blurred vision.  The symptoms also persist.  She does describe some pain to the middle of her forehead today but states that she otherwise has not had any headaches since yesterday.  She denies any weakness or numbness or other acute neurologic symptoms in the rest of her body.  Past Medical History:  Diagnosis Date  . Anxiety   . Arthritis   . Cholecystitis with cholelithiasis 09/19/2017  . Chronic venous insufficiency of lower extremity   . Depression   . Elevated blood pressure reading    no meds  . GERD (gastroesophageal reflux disease)    occ  . Heartburn   . History of kidney stones    h/o  . Hyperlipidemia   . Hypothyroidism   . Migraine 09/23/2017  . Migraines    without aura  . Obesity   . Thyroid disease   . Varicose veins of both lower extremities 09/06/2017    Patient Active Problem List   Diagnosis Date Noted  . Mild episode of recurrent major depressive  disorder ([place]) 04/20/2019  . Urinary incontinence in female 09/01/2018  . Prediabetes 07/25/2018  . Chronic venous insufficiency 07/15/2018  . Lymphedema 07/15/2018  . History of gallbladder disease 02/06/2018  . Morbid obesity ([place]) 02/06/2018  . Bilateral lower extremity edema 01/01/2018  . Depression 09/23/2017  . Hypertension 09/23/2017  . Migraine 09/23/2017  . Varicose veins of both lower extremities 09/06/2017  . Hyperlipidemia 12/23/2016  . Anxiety 12/23/2016  . Increased frequency of headaches 04/28/2016  . Swelling of left ankle joint 04/23/2016  . Hypothyroidism, unspecified 04/23/2016    Past Surgical History:  Procedure Laterality Date  . CHOLECYSTECTOMY N/A 10/08/2017   Procedure: LAPAROSCOPIC CHOLECYSTECTOMY;  Surgeon: Florene Glen, MD;  Location: [facility] ORS;  Service: General;  Laterality: N/A;  . Removal of extra tooth     age 70    Prior to Admission medications   Medication Sig Start Date End Date Taking? Authorizing Provider  azithromycin (ZITHROMAX) 250 MG tablet Take as directed 10/13/19   Steele Sizer, MD  calcium carbonate (OS-CAL) 1250 (500 Ca) MG chewable tablet Chew by mouth.    [provider]  calcium carbonate (TUMS - DOSED IN MG ELEMENTAL CALCIUM) 500 MG chewable tablet Chew 2-3 tablets by mouth 2 (two) times daily as needed for indigestion or heartburn.    [provider]  Cholecalciferol (VITAMIN D3) 2000 units capsule Take 2,000 Units by mouth daily.     [provider]  DULoxetine (CYMBALTA) 20 MG capsule  Take 2 capsules (40 mg total) by mouth daily. 07/02/19   Delsa Grana, PA-C  hydrochlorothiazide (MICROZIDE) 12.5 MG capsule TAKE 1 CAPSULE BY MOUTH EVERY DAY 07/02/19   Delsa Grana, PA-C  levothyroxine (SYNTHROID) 112 MCG tablet Take 1 tablet (112 mcg total) by mouth daily before breakfast. 07/02/19   Delsa Grana, PA-C  Magnesium 250 MG TABS Take 250 mg by mouth at bedtime.     [provider]  Multiple  Vitamin (MULTI-VITAMINS) TABS Take 1 tablet by mouth daily.     [provider]  OREGANO PO Take 150 capsules by mouth daily as needed (immune support).    [provider]  predniSONE (DELTASONE) 20 MG tablet Take 3 tablets (60 mg total) by mouth daily for 5 days. 10/27/19 11/01/19  Arta Silence, MD  promethazine (PHENERGAN) 25 MG suppository USE ONE SUPPOSITORY RECTALLY EVERY 6 HOURS AS NEEDED FOR NAUSEA FOR UP TO 7 DAYS 07/02/19   Delsa Grana, PA-C  SUMAtriptan (IMITREX) 100 MG tablet May repeat in 2 hours if headache persists or recurs. 07/02/19   Delsa Grana, PA-C  Turmeric 500 MG CAPS Take 500 mg by mouth daily.    [provider]  umeclidinium-vilanterol (ANORO ELLIPTA) 62.5-25 MCG/INH AEPB Inhale 1 puff into the lungs daily. 10/13/19   Steele Sizer, MD    Allergies Cefaclor  Family History  Problem Relation Age of Onset  . Depression Mother   . Arthritis Mother   . Stroke Father   . Hypertension Father   . Breast cancer Paternal Aunt 51  . Emphysema Maternal Grandmother   . Emphysema Maternal Grandfather   . Dementia Paternal Grandmother   . Asthma Son   . Eczema Son   . Kidney disease Neg Hx   . Bladder Cancer Neg Hx     Social History Social History   Tobacco Use  . Smoking status: Never Smoker  . Smokeless tobacco: Never Used  Substance Use Topics  . Alcohol use: No  . Drug use: No    Review of Systems  Constitutional: No fever. Eyes: Positive for right eye blurred vision.. ENT: No sore throat. Cardiovascular: Denies chest pain. Respiratory: Denies shortness of breath. Gastrointestinal: No vomiting or diarrhea.  Genitourinary: Negative for dysuria.  Musculoskeletal: Negative for back pain. Skin: Negative for rash. Neurological: Positive for resolved headache.  Negative for focal weakness or numbness.   ____________________________________________   PHYSICAL EXAM:  VITAL SIGNS: ED Triage Vitals  Enc Vitals Group      BP 10/27/19 1842 (!) 181/82     Pulse Rate 10/27/19 1842 76     Resp 10/27/19 1842 16     Temp 10/27/19 1842 98.2 F (36.8 C)     Temp Source 10/27/19 1842 Oral     SpO2 10/27/19 1842 98 %     Weight 10/27/19 1843 228 lb (103.4 kg)     Height 10/27/19 1843 '5\' 5"'  (1.651 m)     Head Circumference --      Peak Flow --      Pain Score 10/27/19 1842 7     Pain Loc --      Pain Edu? --      Excl. in [place]? --     Constitutional: Alert and oriented. Well appearing and in no acute distress. Eyes: Conjunctivae are normal.  EOMI.  PERRLA.  No corneal abrasion or abnormal uptake on fluorescein exam. Head: Atraumatic.  No temporal artery area tenderness. Nose: No congestion/rhinnorhea. Mouth/Throat: Mucous membranes are moist.  Neck: Normal range of motion.  Cardiovascular: Normal rate, regular rhythm.  Good peripheral circulation. Respiratory: Normal respiratory effort.  No retractions. Gastrointestinal: No distention.  Musculoskeletal:  Extremities warm and well perfused.  Neurologic:  Normal speech and language.  5/5 motor strength and intact sensation all extremities.  No pronator drift.  Normal coordination with no ataxia on finger-to-nose.  No facial droop.  Subjective altered sensation in the right forehead and eyelid area, not involving the lower part of the face. Skin:  Skin is warm and dry. No rash noted. Psychiatric: Mood and affect are normal. Speech and behavior are normal.  ____________________________________________   LABS (all labs ordered are listed, but only abnormal results are displayed)  Labs Reviewed  COMPREHENSIVE METABOLIC PANEL - Abnormal; Notable for the following components:      Result Value   Glucose, Bld 114 (*)    All other components within normal limits  SEDIMENTATION RATE - Abnormal; Notable for the following components:   Sed Rate 35 (*)    All other components within normal limits  APTT  CBC  DIFFERENTIAL  PROTIME-INR  CBG MONITORING, ED    ____________________________________________  EKG  ED ECG REPORT I, Arta Silence, the attending physician, personally viewed and interpreted this ECG.  Date: 10/27/2019 EKG Time: 1853 Rate: 63 Rhythm: normal sinus rhythm QRS Axis: normal Intervals: normal ST/T Wave abnormalities: normal Narrative Interpretation: no evidence of acute ischemia  ____________________________________________  RADIOLOGY  CT head: No acute abnormality MR brain: No acute abnormality  ____________________________________________   PROCEDURES  Procedure(s) performed: No  Procedures  Critical Care performed: No ____________________________________________   INITIAL IMPRESSION / ASSESSMENT AND PLAN / ED COURSE  Pertinent labs & imaging results that were available during my care of the patient were reviewed by me and considered in my medical decision making (see chart for details).  49 year old female with PMH as noted above including hypertension, hyperlipidemia and migraines but no prior cardiac history presents with blurred vision in the right eye and paresthesias in the right upper part of the face since this afternoon, along with a resolved migraine yesterday morning.  The patient reports that she had dental work done yesterday with injection of local anesthesia into her right jaw.  I reviewed the past medical records in [place].  The patient has not had any recent prior ED visits or admissions.  On exam, she is overall well-appearing.  She is slightly hypertensive with otherwise normal vital signs.  The neurologic exam is normal except for subjective altered sensation mainly to the right eyelid and slightly in the right forehead and maxillary area, not involving the lower face.  She has no facial droop, and no other neurologic findings.  Her NIH stroke scale is 0.  Overall I suspect most likely atypical migraine or other benign etiology.  The presentation is inconsistent with stroke  given the distribution of the tingling and the mild blurred vision with no hemianopia or visual loss.  I also have a low suspicion for temporal arteritis, although this is on the differential.  The patient has no temporal artery tenderness.  Initial lab work-up is unremarkable.  CT head shows no acute abnormalities.  I have added on an ESR to help evaluate for temporal arteritis, as well as an MRI of the brain.  In the meantime I will treat for likely migraine with Reglan, Benadryl, and steroid.  If these additional tests are negative anticipate that the patient will be appropriate for discharge home with PMD and  ophthalmology follow-up.  ----------------------------------------- 11:20 PM on 10/27/2019 -----------------------------------------  MRI shows no acute findings.  The ESR is only minimally elevated, which is not consistent with temporal arteritis.  The patient reports that the paresthesias are about the same, but her vision has improved after Reglan and prednisone.  This is more suggestive of migraine.  At this time given the improving symptoms and negative work-up, the patient is stable for discharge home.  I still would like her to follow-up with ophthalmology in the next few days, and I will provisionally start her on a steroid course although the clinical suspicion for arteritis is very low.  I had an extensive discussion with the patient about the work-up and plan of care.  She feels comfortable going home.  I instructed her to call ophthalmology first thing tomorrow morning to arrange for follow-up in the next few days.  Return precautions given, and she expresses understanding. ____________________________________________   FINAL CLINICAL IMPRESSION(S) / ED DIAGNOSES  Final diagnoses:  Vision disturbance  Facial paresthesia      NEW MEDICATIONS STARTED DURING THIS VISIT:  New Prescriptions   PREDNISONE (DELTASONE) 20 MG TABLET    Take 3 tablets (60 mg total) by mouth  daily for 5 days.     Note:  This document was prepared using Dragon voice recognition software and may include unintentional dictation errors.    Arta Silence, MD 10/27/19 2924    Arta Silence, MD 10/27/19 [phone]

## 2019-10-27 NOTE — ED Triage Notes (Addendum)
Patient reports yesterday having a filling and crown placed at dentist. This morning c/o right eye feeling like pins and needles all around it. Today around 4pm started experiencing blurry vision in right eye. HX migraines. Speech clear. No weakness. Face symmetrical. Patient also reports she has been having stool in her blood this past week. Has noticed three times specifically. Has appointment with GI coming up but has not told them about the blood in her stool

## 2019-11-03 ENCOUNTER — Other Ambulatory Visit: Payer: BC Managed Care – PPO

## 2019-11-04 ENCOUNTER — Encounter: Payer: Self-pay | Admitting: Gastroenterology

## 2019-11-04 ENCOUNTER — Other Ambulatory Visit: Payer: Self-pay | Admitting: Gastroenterology

## 2019-11-04 ENCOUNTER — Ambulatory Visit (INDEPENDENT_AMBULATORY_CARE_PROVIDER_SITE_OTHER): Payer: BC Managed Care – PPO | Admitting: Gastroenterology

## 2019-11-04 ENCOUNTER — Ambulatory Visit: Payer: BC Managed Care – PPO | Admitting: Gastroenterology

## 2019-11-04 ENCOUNTER — Other Ambulatory Visit: Payer: Self-pay

## 2019-11-04 VITALS — BP 125/85 | HR 79 | Temp 97.4°F | Ht <= 58 in | Wt 234.2 lb

## 2019-11-04 DIAGNOSIS — K529 Noninfective gastroenteritis and colitis, unspecified: Secondary | ICD-10-CM | POA: Diagnosis not present

## 2019-11-04 DIAGNOSIS — Z1211 Encounter for screening for malignant neoplasm of colon: Secondary | ICD-10-CM

## 2019-11-04 DIAGNOSIS — K625 Hemorrhage of anus and rectum: Secondary | ICD-10-CM

## 2019-11-04 MED ORDER — NA SULFATE-K SULFATE-MG SULF 17.5-3.13-1.6 GM/177ML PO SOLN: 354.0000 mL | Freq: Once | ORAL | 0 refills | Status: AC

## 2019-11-04 NOTE — Progress Notes (Signed)
Cephas Darby, MD [address]  [place]  [place], [place] [address]  Main: [phone]  Fax: [phone]    Gastroenterology Consultation  Referring Provider:     Rubie Maid, MD Primary Care Physician:  Delsa Grana, PA-C Primary Gastroenterologist:  Dr. Cephas Darby Reason for Consultation: Chronic diarrhea        HPI:   Nicole Osborn is a 49 y.o. female referred by Dr. Delsa Grana, PA-C  for consultation & management of 2 years h/o perianal itching, burning, hard to clean, occasional blood on wiping (every 82months or so), protrusion of tissue and has to push back in.  Denies constipation, or diarrhea.  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  She continues to have protrusion of the tissue per rectum after hemorrhoid ligation x2 in 2019.  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  She reports that she has been going through a lot of stress within last 1 year between school and also she was trying to get weight loss surgery performed but it did not happen.  She thinks, a lot of her GI symptoms has to do with her dietary habits.  She does not smoke or drink alcohol NSAIDs: None  Antiplts/Anticoagulants/Anti thrombotics: None  GI Procedures: None She denies any GI surgeries She denies family history of GI malignancy She denies smoking or alcohol use  Past Medical History:  Diagnosis Date  . Anxiety   . Arthritis   . Cholecystitis with cholelithiasis 09/19/2017  . Chronic venous insufficiency of lower extremity   . Depression   . Elevated blood pressure reading    no meds  . GERD (gastroesophageal  reflux disease)    occ  . Heartburn   . History of kidney stones    h/o  . Hyperlipidemia   . Hypothyroidism   . Migraine 09/23/2017  . Migraines    without aura  . Obesity   . Thyroid disease   . Varicose veins of both lower extremities 09/06/2017    Past Surgical History:  Procedure Laterality Date  . CHOLECYSTECTOMY N/A 10/08/2017   Procedure: LAPAROSCOPIC CHOLECYSTECTOMY;  Surgeon: Florene Glen, MD;  Location: [facility] ORS;  Service: General;  Laterality: N/A;  . Removal of extra tooth     age 15    Current Outpatient Medications:  .  calcium carbonate (TUMS - DOSED IN MG ELEMENTAL CALCIUM) 500 MG chewable tablet, Chew 2-3 tablets by mouth 2 (two) times daily as needed for indigestion or heartburn., Disp: , Rfl:  .  Cholecalciferol (VITAMIN D3) 2000 units capsule, Take 2,000 Units by mouth daily. , Disp: , Rfl:  .  clobetasol (OLUX) 0.05 % topical foam, , Disp: , Rfl:  .  DULoxetine (CYMBALTA) 20 MG capsule, Take 2 capsules (40 mg total) by mouth daily., Disp: 180 capsule, Rfl: 1 .  erythromycin ophthalmic ointment, 1 application at bedtime., Disp: , Rfl:  .  hydrochlorothiazide (MICROZIDE) 12.5 MG capsule, TAKE 1 CAPSULE BY MOUTH EVERY DAY, Disp: 90 capsule, Rfl: 1 .  levothyroxine (SYNTHROID) 112 MCG tablet, Take 1 tablet (112 mcg total) by mouth daily before breakfast., Disp: 90 tablet, Rfl: 1 .  Magnesium 250 MG TABS, Take 250 mg by mouth at bedtime. , Disp: , Rfl:  .  Multiple Vitamin (MULTI-VITAMINS) TABS, Take 1 tablet by mouth daily. , Disp: , Rfl:  .  OREGANO PO, Take 150 capsules by mouth daily as needed (immune support)., Disp: , Rfl:  .  promethazine (PHENERGAN) 25 MG suppository, USE ONE SUPPOSITORY RECTALLY EVERY 6 HOURS AS NEEDED FOR NAUSEA FOR UP TO 7 DAYS, Disp: 12 each, Rfl: 2 .  SUMAtriptan (IMITREX) 100 MG tablet, May repeat in 2 hours if headache persists or recurs., Disp: 12 tablet, Rfl: 1 .  Turmeric 500 MG CAPS, Take 500 mg by mouth daily., Disp: , Rfl:   .  valACYclovir (VALTREX) 1000 MG tablet, , Disp: , Rfl:  .  Na Sulfate-K Sulfate-Mg Sulf 17.5-3.13-1.6 GM/177ML SOLN, Take 354 mLs by mouth once for 1 dose., Disp: 354 mL, Rfl: 0    Family History  Problem Relation Age of Onset  . Depression Mother   . Arthritis Mother   . Stroke Father   . Hypertension Father   . Breast cancer Paternal Aunt 87  . Emphysema Maternal Grandmother   . Emphysema Maternal Grandfather   . Dementia Paternal Grandmother   . Asthma Son   . Eczema Son   . Kidney disease Neg Hx   . Bladder Cancer Neg Hx      Social History   Tobacco Use  . Smoking status: Never Smoker  . Smokeless tobacco: Never Used  Substance Use Topics  . Alcohol use: No  . Drug use: No    Allergies as of 11/04/2019 - Review Complete 11/04/2019  Allergen Reaction Noted  . Cefaclor Swelling 12/15/2015    Review of Systems:    All systems reviewed and negative except where noted in HPI.   Physical Exam:  BP 125/85 (BP Location: Left Arm, Patient Position: Sitting, Cuff Size: Normal)   Pulse 79   Temp (!) 97.4 F (36.3 C) (Tympanic)   Wt 234 lb 4 oz (106.3 kg)   LMP 07/12/2016   BMI 38.98 kg/m  Patient's last menstrual period was 07/12/2016.  General:   Alert,  Well-developed, well-nourished, pleasant and cooperative in NAD Head:  Normocephalic and atraumatic. Eyes:  Sclera clear, no icterus.  Conjunctiva pink. Ears:  Normal auditory acuity. Nose:  No deformity, discharge, or lesions. Mouth:  No deformity or lesions,oropharynx pink & moist. Neck:  Supple; no masses or thyromegaly. Lungs:  Respirations even and unlabored.  Clear throughout to auscultation.   No wheezes, crackles, or rhonchi. No acute distress. Heart:  Regular rate and rhythm; no murmurs, clicks, rubs, or gallops. Abdomen:  Normal bowel sounds. Soft, morbidly obese, non-tender and non-distended without palpable masses or hernias noted.  No guarding or rebound tenderness.   Rectal: Perianal skin  tag Msk:  Symmetrical without gross deformities. Good, equal movement & strength bilaterally. Pulses:  Normal pulses noted. Extremities:  No clubbing or edema.  No cyanosis. Neurologic:  Alert and oriented x3;  grossly normal neurologically. Skin: Papular, dry, healing, crusted eruptions on right forehead, upper part of right cheek and temple secondary to herpes zoster.  No jaundice. Psych:  Alert and cooperative. Normal mood and affect.  Imaging Studies: Reviewed  Assessment and Plan:   Nicole Osborn is a 49 y.o. Caucasian female with morbid obesity, status post laparoscopic cholecystectomy, hypothyroidism, outpatient hemorrhoid ligation for symptomatic external hemorrhoids x2 in 2019 is seen for follow-up of chronic diarrhea  Chronic diarrhea Recommend H. pylori breath  test, stool studies to rule out infection, celiac serologies Avoid carbonated beverages  Colon cancer screening Recommend colonoscopy   Follow up after colonoscopy   Cephas Darby, MD

## 2019-11-05 ENCOUNTER — Encounter: Payer: Self-pay | Admitting: Family Medicine

## 2019-11-06 ENCOUNTER — Other Ambulatory Visit
Admission: RE | Admit: 2019-11-06 | Discharge: 2019-11-06 | Disposition: A | Discharge status: Home / Self Care | Payer: BC Managed Care – PPO | Attending: Gastroenterology | Admitting: Gastroenterology

## 2019-11-06 ENCOUNTER — Other Ambulatory Visit: Payer: Self-pay

## 2019-11-06 DIAGNOSIS — K529 Noninfective gastroenteritis and colitis, unspecified: Secondary | ICD-10-CM | POA: Insufficient documentation

## 2019-11-09 ENCOUNTER — Telehealth: Payer: Self-pay | Admitting: Gastroenterology

## 2019-11-09 NOTE — Telephone Encounter (Signed)
Pt left vm she saw Dr. Marius Ditch last week in the [facility] office and is supposed to have a Breath test done the lab she went to has no knowledge of this test and she is calling to see where she can have this done please call pt

## 2019-11-10 LAB — CELIAC DISEASE PANEL
Endomysial Ab, IgA: NEGATIVE
IgA: 145 mg/dL (ref 87–352)
Tissue Transglutaminase Ab, IgA: <2 U/mL (ref 0–3)

## 2019-11-12 ENCOUNTER — Encounter: Payer: Self-pay | Admitting: Family Medicine

## 2019-11-12 ENCOUNTER — Ambulatory Visit (INDEPENDENT_AMBULATORY_CARE_PROVIDER_SITE_OTHER): Payer: BC Managed Care – PPO | Admitting: Family Medicine

## 2019-11-12 ENCOUNTER — Other Ambulatory Visit: Payer: Self-pay

## 2019-11-12 VITALS — Wt 227.0 lb

## 2019-11-12 DIAGNOSIS — F459 Somatoform disorder, unspecified: Secondary | ICD-10-CM

## 2019-11-12 DIAGNOSIS — F419 Anxiety disorder, unspecified: Secondary | ICD-10-CM

## 2019-11-12 DIAGNOSIS — F439 Reaction to severe stress, unspecified: Secondary | ICD-10-CM

## 2019-11-12 DIAGNOSIS — F331 Major depressive disorder, recurrent, moderate: Secondary | ICD-10-CM

## 2019-11-12 DIAGNOSIS — F410 Panic disorder [episodic paroxysmal anxiety] without agoraphobia: Secondary | ICD-10-CM

## 2019-11-12 MED ORDER — BUPROPION HCL ER (XL) 150 MG PO TB24: 150.0000 mg | ORAL_TABLET | Freq: Every day | ORAL | 1 refills | Status: DC

## 2019-11-12 NOTE — Progress Notes (Addendum)
Name: Nicole Osborn   MRN: [id number]    DOB: 1970/12/16   Date:11/12/2019       Progress Note  Subjective:    Chief Complaint  Chief Complaint  Patient presents with  . Depression  . Shingles    Onset 10/26/19.  Seeing Dermatologist  . FMLA    I connected with  Lovett Sox  on 11/12/19 at  9:20 AM EST by a video enabled telemedicine application and verified that I am speaking with the correct person using two identifiers.  I discussed the limitations of evaluation and management by telemedicine and the availability of in person appointments. The patient expressed understanding and agreed to proceed. Staff also discussed with the patient that there may be a patient responsible charge related to this service. Patient Location: home Provider Location: home (due to weather) Additional Individuals present:   HPI    Pt presents for FMLA paperwork  She feels like shes really a positive person She's had panic attacks at work shes "always behind always failing, failing at home and work, failing with her health" She will just go for a drive and she can't make a decision, she will sit paralysed and cannot move or do antyhing.  She states the anxiety and depression sx make it so that she cannot prioritize things and makes it so she cannot make herself an appointment. She is on Cymbalta 40 mg daily.  No other psych meds.  She doesn't want to take meds.  She has been referred to psych in the past multiple times but cancelled and no-showed and has been discharged from a local practice.   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  She tries every summer to find a new job. Her job is stressful, in charge of many things, she had 8 administrator over her and this is a huge source of stress.  She works hard all summer looking for a different job and then when she can't find one and has to start the next school year again she gets  more depressed. Since COVID she has been able to work remotely with note from our office since no children were in person.  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  This next week they are required to go back to school and in 2 weeks students will return in person.  She states that her depression and anxiety make it hard for her to think, hard for her to do her job, she had somatic sx and diarrhea.  She reports one psychology eval and document from years ago which states she has these sx (consistent with most dx of depression and more sx) and she says she doesn't want any more meds and isn't going to be able to go to psych or talk to a therapist because she can't do anything to help herself while she feels like this and while having to work. 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   PHQ9 is highly positive today, worse than the last time I saw her in October (only time I have seen her) Depression screen [facility] 2/9 11/12/2019 10/13/2019 07/17/2019  Decreased Interest 2 1 0  Down, Depressed, Hopeless 2 0 0  PHQ - 2 Score 4 1 0  Altered sleeping 3 - 0  Tired, decreased energy 3 - 0  Change in appetite 3 - 0  Feeling bad or failure about yourself  3 - 0  Trouble concentrating 3 - 0  Moving slowly or fidgety/restless 0 - 0  Suicidal thoughts 0 - 0  PHQ-9 Score 19 - 0    Difficult doing work/chores Very difficult - Not difficult at all  Some recent data might be hidden     Patient Active Problem List   Diagnosis Date Noted  . Mild episode of recurrent major depressive disorder ([place]) 04/20/2019  . Urinary incontinence in female 09/01/2018  . Prediabetes 07/25/2018  . Chronic venous insufficiency 07/15/2018  . Lymphedema 07/15/2018  . History of gallbladder disease 02/06/2018  . Morbid obesity ([place]) 02/06/2018  . Bilateral lower extremity edema 01/01/2018  . Depression 09/23/2017  . Hypertension 09/23/2017  . Migraine 09/23/2017  . Varicose veins of both lower extremities 09/06/2017  . Hyperlipidemia 12/23/2016  . Anxiety 12/23/2016  . Increased frequency of headaches 04/28/2016  . Swelling of left ankle joint 04/23/2016  . Hypothyroidism, unspecified 04/23/2016    Social History   Tobacco Use  . Smoking status: Never Smoker  . Smokeless tobacco: Never Used  Substance Use Topics  . Alcohol use: No     Current Outpatient Medications:  .  calcium carbonate (TUMS - DOSED IN MG ELEMENTAL CALCIUM) 500 MG chewable tablet, Chew 2-3 tablets by mouth 2 (two) times daily as needed for indigestion or heartburn., Disp: , Rfl:  .  Cholecalciferol (VITAMIN D3) 2000 units capsule, Take 2,000 Units by mouth daily. , Disp: , Rfl:  .  DULoxetine (CYMBALTA) 20 MG capsule, Take 2 capsules (40 mg total) by mouth daily., Disp: 180 capsule, Rfl: 1 .  erythromycin ophthalmic ointment, 1 application at bedtime., Disp: , Rfl:  .  levothyroxine (SYNTHROID) 112 MCG tablet, Take 1 tablet (112 mcg total) by mouth daily before breakfast., Disp: 90 tablet, Rfl: 1 .  Magnesium 250 MG TABS, Take 250 mg by mouth at bedtime. , Disp: , Rfl:  .  Multiple Vitamin (MULTI-VITAMINS) TABS, Take 1 tablet by mouth daily. , Disp: , Rfl:  .  OREGANO PO, Take 150 capsules by mouth daily as needed (immune support)., Disp: , Rfl:  .  SUMAtriptan (IMITREX) 100 MG tablet, May repeat in 2  hours if headache persists or recurs., Disp: 12 tablet, Rfl: 1 .  Turmeric 500 MG CAPS, Take 500 mg by mouth daily., Disp: , Rfl:  .  valACYclovir (VALTREX) 1000 MG tablet, , Disp: , Rfl:  .  clobetasol (OLUX) 0.05 % topical foam, , Disp: , Rfl:  .  hydrochlorothiazide (MICROZIDE) 12.5 MG capsule, TAKE 1 CAPSULE BY MOUTH EVERY DAY (Patient not taking: Reported on 11/12/2019), Disp: 90 capsule, Rfl: 1 .  promethazine (PHENERGAN) 25 MG suppository, USE ONE SUPPOSITORY RECTALLY EVERY 6 HOURS AS NEEDED FOR NAUSEA FOR UP TO 7 DAYS (Patient not taking: Reported on 11/12/2019),  Disp: 12 each, Rfl: 2  Allergies  Allergen Reactions  . Cefaclor Swelling    Lips swelling      Review of Systems  10 Systems reviewed and are negative for acute change except as noted in the HPI.   Objective:   Virtual encounter, vitals limited, only able to obtain the following Today's Vitals   11/12/19 0841  Weight: 227 lb (103 kg)   Body mass index is 5,275.98 kg/m. Nursing Note and Vital Signs reviewed.  Physical Exam Psychiatric:        Mood and Affect: Affect is not tearful.        Speech: Speech normal.        Behavior: Behavior is not agitated, aggressive or hyperactive.        Thought Content: Thought content does not include suicidal ideation. Thought content does not include suicidal plan.    Pt appears to be sitting on bed, alert, NAD, well appearing, obese PE limited by telephone encounter  No results found for this or any previous visit (from the past 72 hour(s)).  Assessment and Plan:     ICD-10-CM   1. Moderate episode of recurrent major depressive disorder (HCC)  F33.1    worsening PHQ and sx, on cymbalta prior to me seeing her - not best med to manage anxiety and depression, add SSRI? can try adding wellbutrin  2. Anxiety disorder, unspecified type  F41.9 Ambulatory referral to Psychiatry    buPROPion (WELLBUTRIN XL) 150 MG 24 hr tablet   wants FMLA - explained intermittent FMLA with  psych, meds, therapy - pt to drop off forms  3. Panic attack  F41.0 Ambulatory referral to Psychiatry   explained that pt needs therapy and psych eval to tx  4. 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  Explained to her that she will have to see the specialist, psychiatry and do therapy and have a treatment plan which we will need to document on her FMLA work paperwork I do not feel like it is medically indicated to write her out of work completely but intermittent FMLA may be appropriate with corresponding plan.  She continues to explain her severe symptoms her inability to do her job or inability to take care of herself while she is working and continues to do is asked to be out of work indefinitely while trying to work on disability paperwork.  I explained to her that that is not appropriate for her current diagnoses and situation.  We will refer her back to psychiatry, also encouraged her to look up psychiatrist in network with her insurance plan, she will need to do therapy and try other medications.  I have sent in Wellbutrin for her to add to her Cymbalta hoping that we will help her with her anhedonia symptoms.  She refuses to do any more medications either. Referrals and meds were sent in anyway.   Explained paperwork process and turn around time to pt.   Encouraged her to go to [facility] or ER if worsening sx or feels she is a threat to herself or other or has SI.    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  Explained FMLA purpose - to protect her job while she deals with a dx or condition in order to  improve that dx or condition - allowing time away from work, allowing for excused absences without being penalized or fired from job - in her case would be helpful to reduce some job duties, possibly hours, excuse her for appts/therapy or excuse her for days with severe sx like panic attacks, etc.    More than 50 min total time today for encounter - Spent 31 min with pt on the call with "non-face-to-face" time and additional 15 min - time involved but was not limited to reviewing chart (recent and pertinent OV notes and labs), documentation in EMR, and coordinating care and treatment plan. Greater than 50% of this visit was spent in direct face-to-face counseling, obtaining history and physical, discussing and educating pt on treatment plan. Extensive amount of time spent with pt today getting HPI and counseling on anxiety depression coping mechanisms explaining treatments and discussing FMLA job duties and paperwork.  -Red flags and when to present for emergency care or RTC including fever >101.71F, chest pain, shortness of breath, new/worsening/un-resolving symptoms, reviewed with patient at time of visit. Follow up and care instructions discussed and provided in AVS. - I discussed the assessment and treatment plan with the patient. The patient was provided an opportunity to ask questions and all were answered. The patient agreed with the plan and demonstrated an understanding of the instructions.  I provided 31 minutes of non-face-to-face time during this encounter.  Delsa Grana, PA-C 11/12/19 9:14 AM

## 2019-11-12 NOTE — Patient Instructions (Signed)
Bring in paperwork  Get in with psychiatry and start therapy  Add wellbutrin to your cymbalta  7 day turn around for [facility]

## 2019-11-13 ENCOUNTER — Telehealth: Payer: Self-pay

## 2019-11-13 NOTE — Telephone Encounter (Signed)
Patient will come by next week to get blood work. Patient states she tried to go the hospital lab in [facility] and they gave her the stool test to do from home and drew blood. They did not see the order for the H pylori breath test. Patient will bring back the stool test as soon as she can

## 2019-11-13 NOTE — Telephone Encounter (Signed)
-----   Message from Lin Landsman, MD sent at 11/12/2019  5:19 PM EST ----- She can stop by our lab or go to any [facility] next week for H Pylori breath test and also for stool studies that were ordered  RV

## 2019-11-16 ENCOUNTER — Encounter: Payer: Self-pay | Admitting: *Deleted

## 2019-11-16 ENCOUNTER — Encounter: Payer: Self-pay | Admitting: Family Medicine

## 2019-11-16 ENCOUNTER — Other Ambulatory Visit
Admission: RE | Admit: 2019-11-16 | Discharge: 2019-11-16 | Disposition: A | Discharge status: Home / Self Care | Payer: BC Managed Care – PPO | Attending: Gastroenterology | Admitting: Gastroenterology

## 2019-11-16 ENCOUNTER — Other Ambulatory Visit: Payer: Self-pay

## 2019-11-16 DIAGNOSIS — K529 Noninfective gastroenteritis and colitis, unspecified: Secondary | ICD-10-CM | POA: Diagnosis present

## 2019-11-17 ENCOUNTER — Encounter: Payer: Self-pay | Admitting: Family Medicine

## 2019-11-17 LAB — H. PYLORI BREATH TEST: H pylori Breath Test: NEGATIVE

## 2019-11-18 ENCOUNTER — Encounter: Payer: Self-pay | Admitting: Family Medicine

## 2019-11-18 ENCOUNTER — Encounter: Payer: Self-pay | Admitting: Gastroenterology

## 2019-11-18 LAB — GI PATHOGEN PANEL BY PCR, STOOL

## 2019-11-20 ENCOUNTER — Encounter: Payer: Self-pay | Admitting: Family Medicine

## 2019-12-01 ENCOUNTER — Encounter: Payer: Self-pay | Admitting: Gastroenterology

## 2019-12-01 ENCOUNTER — Ambulatory Visit: Payer: BC Managed Care – PPO | Admitting: Gastroenterology

## 2019-12-02 ENCOUNTER — Telehealth: Payer: Self-pay

## 2019-12-02 NOTE — Telephone Encounter (Signed)
Dr. Marius Ditch states that her psychiatrist needed to do [facility] for patient. Patient verbalized understanding states she will call them. Patient states to shred her FMAL. Informed patient that IBS would not be a reason for [facility]

## 2019-12-02 NOTE — Telephone Encounter (Signed)
Patient brought in [facility] forms this morning. I talk to jody and we do not know how we can do GI FMAL for her based on her GI issues. Patient states she is needing FMAL because she is stressed about going back to work and is having panic attacks about it. Patient states that she thinks she got shingles from all the stress. Please advised how we can do [facility] for this patient. Informed patient I would talk to the provider and give a call to let her know if we can do this or not

## 2019-12-09 ENCOUNTER — Other Ambulatory Visit: Payer: Self-pay

## 2019-12-09 ENCOUNTER — Other Ambulatory Visit
Admission: RE | Admit: 2019-12-09 | Discharge: 2019-12-09 | Disposition: A | Discharge status: Home / Self Care | Payer: BC Managed Care – PPO | Source: Ambulatory Visit | Attending: Gastroenterology | Admitting: Gastroenterology

## 2019-12-09 DIAGNOSIS — Z20822 Contact with and (suspected) exposure to covid-19: Secondary | ICD-10-CM | POA: Diagnosis not present

## 2019-12-09 DIAGNOSIS — Z01812 Encounter for preprocedural laboratory examination: Secondary | ICD-10-CM | POA: Diagnosis not present

## 2019-12-09 LAB — SARS CORONAVIRUS 2 (TAT 6-24 HRS): SARS Coronavirus 2: NEGATIVE

## 2019-12-09 MED ORDER — GOLYTELY 236 G PO SOLR: 4000.0000 mL | Freq: Once | ORAL | 0 refills | Status: AC

## 2019-12-11 ENCOUNTER — Encounter
Admission: RE | Disposition: A | Discharge status: Home / Self Care | Payer: Self-pay | Source: Home / Self Care | Attending: Gastroenterology

## 2019-12-11 ENCOUNTER — Encounter: Payer: Self-pay | Admitting: Gastroenterology

## 2019-12-11 ENCOUNTER — Ambulatory Visit: Payer: BC Managed Care – PPO | Admitting: Registered Nurse

## 2019-12-11 ENCOUNTER — Other Ambulatory Visit: Payer: Self-pay

## 2019-12-11 ENCOUNTER — Ambulatory Visit
Admission: RE | Admit: 2019-12-11 | Discharge: 2019-12-11 | Disposition: A | Discharge status: Home / Self Care | Payer: BC Managed Care – PPO | Attending: Gastroenterology | Admitting: Gastroenterology

## 2019-12-11 DIAGNOSIS — Z6837 Body mass index (BMI) 37.0-37.9, adult: Secondary | ICD-10-CM | POA: Insufficient documentation

## 2019-12-11 DIAGNOSIS — Z79890 Hormone replacement therapy (postmenopausal): Secondary | ICD-10-CM | POA: Diagnosis not present

## 2019-12-11 DIAGNOSIS — Z1211 Encounter for screening for malignant neoplasm of colon: Secondary | ICD-10-CM | POA: Diagnosis not present

## 2019-12-11 DIAGNOSIS — M1990 Unspecified osteoarthritis, unspecified site: Secondary | ICD-10-CM | POA: Insufficient documentation

## 2019-12-11 DIAGNOSIS — I872 Venous insufficiency (chronic) (peripheral): Secondary | ICD-10-CM | POA: Diagnosis not present

## 2019-12-11 DIAGNOSIS — K644 Residual hemorrhoidal skin tags: Secondary | ICD-10-CM | POA: Diagnosis not present

## 2019-12-11 DIAGNOSIS — F329 Major depressive disorder, single episode, unspecified: Secondary | ICD-10-CM | POA: Insufficient documentation

## 2019-12-11 DIAGNOSIS — G43909 Migraine, unspecified, not intractable, without status migrainosus: Secondary | ICD-10-CM | POA: Insufficient documentation

## 2019-12-11 DIAGNOSIS — E669 Obesity, unspecified: Secondary | ICD-10-CM | POA: Diagnosis not present

## 2019-12-11 DIAGNOSIS — Z79899 Other long term (current) drug therapy: Secondary | ICD-10-CM | POA: Insufficient documentation

## 2019-12-11 DIAGNOSIS — Z8261 Family history of arthritis: Secondary | ICD-10-CM | POA: Insufficient documentation

## 2019-12-11 DIAGNOSIS — E039 Hypothyroidism, unspecified: Secondary | ICD-10-CM | POA: Diagnosis not present

## 2019-12-11 DIAGNOSIS — F419 Anxiety disorder, unspecified: Secondary | ICD-10-CM | POA: Insufficient documentation

## 2019-12-11 HISTORY — PX: COLONOSCOPY WITH PROPOFOL: SHX5780

## 2019-12-11 LAB — POCT PREGNANCY, URINE: Preg Test, Ur: NEGATIVE

## 2019-12-11 SURGERY — COLONOSCOPY WITH PROPOFOL
Anesthesia: General

## 2019-12-11 MED ORDER — LIDOCAINE HCL (CARDIAC) PF 100 MG/5ML IV SOSY
PREFILLED_SYRINGE | INTRAVENOUS | Status: DC | PRN
  Administered 2019-12-11: 100 mg via INTRAVENOUS

## 2019-12-11 MED ORDER — PROPOFOL 10 MG/ML IV BOLUS
INTRAVENOUS | Status: DC | PRN
  Administered 2019-12-11: 100 mg via INTRAVENOUS

## 2019-12-11 MED ORDER — LIDOCAINE HCL (PF) 2 % IJ SOLN
INTRAMUSCULAR | Status: AC
  Filled 2019-12-11: qty 10

## 2019-12-11 MED ORDER — PROPOFOL 500 MG/50ML IV EMUL
INTRAVENOUS | Status: DC | PRN
  Administered 2019-12-11: 125 ug/kg/min via INTRAVENOUS

## 2019-12-11 MED ORDER — SODIUM CHLORIDE 0.9 % IV SOLN: INTRAVENOUS | Status: DC

## 2019-12-11 NOTE — Anesthesia Preprocedure Evaluation (Signed)
Anesthesia Evaluation  Patient identified by MRN, date of birth, ID band Patient awake    Reviewed: Allergy & Precautions, H&P , NPO status , Patient's Chart, lab work & pertinent test results  History of Anesthesia Complications Negative for: history of anesthetic complications  Airway Mallampati: III  TM Distance: <3 FB Neck ROM: full    Dental  (+) Chipped   Pulmonary neg pulmonary ROS, neg shortness of breath,           Cardiovascular Exercise Tolerance: Good hypertension, (-) angina+ Peripheral Vascular Disease  (-) Past MI and (-) DOE      Neuro/Psych  Headaches, PSYCHIATRIC DISORDERS Anxiety Depression    GI/Hepatic Neg liver ROS, GERD  Medicated and Controlled,  Endo/Other  Hypothyroidism   Renal/GU      Musculoskeletal  (+) Arthritis ,   Abdominal   Peds  Hematology negative hematology ROS (+)   Anesthesia Other Findings Past Medical History: No date: Anxiety No date: Arthritis 09/19/2017: Cholecystitis with cholelithiasis No date: Depression No date: Elevated blood pressure reading     Comment:  no meds No date: GERD (gastroesophageal reflux disease)     Comment:  occ No date: Heartburn No date: History of kidney stones     Comment:  h/o No date: Hyperlipidemia No date: Hypothyroidism 09/23/2017: Migraine No date: Migraines     Comment:  without aura No date: Obesity No date: Thyroid disease 09/06/2017: Varicose veins of both lower extremities  Past Surgical History: No date: Removal of extra tooth     Comment:  age 49     Reproductive/Obstetrics negative OB ROS                             Anesthesia Physical  Anesthesia Plan  ASA: III  Anesthesia Plan: General   Post-op Pain Management:    Induction: Intravenous  PONV Risk Score and Plan: Propofol infusion  Airway Management Planned: Nasal Cannula  Additional Equipment:   Intra-op Plan:    Post-operative Plan:   Informed Consent: I have reviewed the patients History and Physical, chart, labs and discussed the procedure including the risks, benefits and alternatives for the proposed anesthesia with the patient or authorized representative who has indicated his/her understanding and acceptance.     Dental Advisory Given  Plan Discussed with: Anesthesiologist, CRNA and Surgeon  Anesthesia Plan Comments: (Patient consented for risks of anesthesia including but not limited to:  - adverse reactions to medications - damage to teeth, lips or other oral mucosa - sore throat or hoarseness - Damage to heart, brain, lungs or loss of life  Patient voiced understanding.)        Anesthesia Quick Evaluation

## 2019-12-11 NOTE — H&P (Signed)
Cephas Darby, MD [address]  [place]  [place], [place] [address]  Main: [phone]  Fax: [phone] Pager: [phone]  Primary Care Physician:  Delsa Grana, PA-C Primary Gastroenterologist:  Dr. Cephas Darby  Pre-Procedure History & Physical: HPI:  Nicole Osborn is a 49 y.o. female is here for an colonoscopy.   Past Medical History:  Diagnosis Date   Anxiety    Arthritis    Cholecystitis with cholelithiasis 09/19/2017   Chronic venous insufficiency of lower extremity    Depression    Elevated blood pressure reading    no meds   GERD (gastroesophageal reflux disease)    occ   Heartburn    History of kidney stones    h/o   Hyperlipidemia    Hypothyroidism    Migraine 09/23/2017   Migraines    without aura   Obesity    Thyroid disease    Varicose veins of both lower extremities 09/06/2017    Past Surgical History:  Procedure Laterality Date   CHOLECYSTECTOMY N/A 10/08/2017   Procedure: LAPAROSCOPIC CHOLECYSTECTOMY;  Surgeon: Florene Glen, MD;  Location: [facility] ORS;  Service: General;  Laterality: N/A;   Removal of extra tooth     age 46    Prior to Admission medications   Medication Sig Start Date End Date Taking? Authorizing Provider  buPROPion (WELLBUTRIN XL) 150 MG 24 hr tablet Take 1 tablet (150 mg total) by mouth daily. 11/12/19   Delsa Grana, PA-C  calcium carbonate (TUMS - DOSED IN MG ELEMENTAL CALCIUM) 500 MG chewable tablet Chew 2-3 tablets by mouth 2 (two) times daily as needed for indigestion or heartburn.    [provider]  Cholecalciferol (VITAMIN D3) 2000 units capsule Take 2,000 Units by mouth daily.     [provider]  clobetasol (OLUX) 0.05 % topical foam  10/29/19   [provider]  DULoxetine (CYMBALTA) 20 MG capsule Take 2 capsules (40 mg total) by mouth daily. 07/02/19   Delsa Grana, PA-C  erythromycin ophthalmic ointment 1 application at bedtime.    [provider]  hydrochlorothiazide  (MICROZIDE) 12.5 MG capsule TAKE 1 CAPSULE BY MOUTH EVERY DAY Patient not taking: Reported on 11/12/2019 07/02/19   Delsa Grana, PA-C  levothyroxine (SYNTHROID) 112 MCG tablet Take 1 tablet (112 mcg total) by mouth daily before breakfast. 07/02/19   Delsa Grana, PA-C  Magnesium 250 MG TABS Take 250 mg by mouth at bedtime.     [provider]  Multiple Vitamin (MULTI-VITAMINS) TABS Take 1 tablet by mouth daily.     [provider]  OREGANO PO Take 150 capsules by mouth daily as needed (immune support).    [provider]  promethazine (PHENERGAN) 25 MG suppository USE ONE SUPPOSITORY RECTALLY EVERY 6 HOURS AS NEEDED FOR NAUSEA FOR UP TO 7 DAYS Patient not taking: Reported on 11/12/2019 07/02/19   Delsa Grana, PA-C  SUMAtriptan (IMITREX) 100 MG tablet May repeat in 2 hours if headache persists or recurs. 07/02/19   Delsa Grana, PA-C  Turmeric 500 MG CAPS Take 500 mg by mouth daily.    [provider]  valACYclovir (VALTREX) 1000 MG tablet  10/29/19   [provider]    Allergies as of 11/04/2019 - Review Complete 11/04/2019  Allergen Reaction Noted   Cefaclor Swelling 12/15/2015    Family History  Problem Relation Age of Onset   Depression Mother    Arthritis Mother    Stroke Father    Hypertension  Father    Breast cancer Paternal Aunt 31   Emphysema Maternal Grandmother    Emphysema Maternal Grandfather    Dementia Paternal Grandmother    Asthma Son    Eczema Son    Kidney disease Neg Hx    Bladder Cancer Neg Hx     Social History   Socioeconomic History   Marital status: Married    Spouse name: Not on file   Number of children: Not on file   Years of education: Not on file   Highest education level: Not on file  Occupational History   Not on file  Tobacco Use   Smoking status: Never Smoker   Smokeless tobacco: Never Used  Substance and Sexual Activity   Alcohol use: No   Drug use: No   Sexual activity: Yes    Birth  control/protection: Surgical, Other-see comments    Comment: Husband with vastectomy   Other Topics Concern   Not on file  Social History Narrative   Not on file   Social Determinants of Health   Financial Resource Strain:    Difficulty of Paying Living Expenses:   Food Insecurity:    Worried About Charity fundraiser in the Last Year:    Arboriculturist in the Last Year:   Transportation Needs:    Film/video editor (Medical):    Lack of Transportation (Non-Medical):   Physical Activity:    Days of Exercise per Week:    Minutes of Exercise per Session:   Stress:    Feeling of Stress :   Social Connections:    Frequency of Communication with Friends and Family:    Frequency of Social Gatherings with Friends and Family:    Attends Religious Services:    Active Member of Clubs or Organizations:    Attends Music therapist:    Marital Status:   Intimate Partner Violence:    Fear of Current or Ex-Partner:    Emotionally Abused:    Physically Abused:    Sexually Abused:     Review of Systems: See HPI, otherwise negative ROS  Physical Exam: BP (!) 144/92   Pulse 93   Temp (!) 97.3 F (36.3 C)   Resp 16   Wt 226 lb (102.5 kg)   LMP 07/12/2016   SpO2 96%   BMI 5252.74 kg/m  General:   Alert,  pleasant and cooperative in NAD Head:  Normocephalic and atraumatic. Neck:  Supple; no masses or thyromegaly. Lungs:  Clear throughout to auscultation.    Heart:  Regular rate and rhythm. Abdomen:  Soft, nontender and nondistended. Normal bowel sounds, without guarding, and without rebound.   Neurologic:  Alert and  oriented x4;  grossly normal neurologically.  Impression/Plan: Nicole Osborn is here for an colonoscopy to be performed for colon cancer screening  Risks, benefits, limitations, and alternatives regarding  colonoscopy have been reviewed with the patient.  Questions have been answered.  All parties agreeable.   Sherri Sear, MD  12/11/2019, 9:35  AM

## 2019-12-11 NOTE — Transfer of Care (Signed)
Immediate Anesthesia Transfer of Care Note  Patient: Nicole Osborn  Procedure(s) Performed: COLONOSCOPY WITH PROPOFOL (N/A )  Patient Location: Endoscopy Unit  Anesthesia Type:General  Level of Consciousness: drowsy  Airway & Oxygen Therapy: Patient Spontanous Breathing  Post-op Assessment: Report given to RN and Post -op Vital signs reviewed and stable  Post vital signs: Reviewed and stable  Last Vitals:  Vitals Value Taken Time  BP    Temp    Pulse    Resp    SpO2      Last Pain:  Vitals:   12/11/19 0923  PainSc: 0-No pain         Complications: No apparent anesthesia complications

## 2019-12-11 NOTE — Anesthesia Postprocedure Evaluation (Signed)
Anesthesia Post Note  Patient: Nicole Osborn  Procedure(s) Performed: COLONOSCOPY WITH PROPOFOL (N/A )  Patient location during evaluation: Endoscopy Anesthesia Type: General Level of consciousness: awake and alert and oriented Pain management: pain level controlled Vital Signs Assessment: post-procedure vital signs reviewed and stable Respiratory status: spontaneous breathing Cardiovascular status: blood pressure returned to baseline Anesthetic complications: no     Last Vitals:  Vitals:   12/11/19 1010 12/11/19 1029  BP: (!) 109/98 126/70  Pulse: 70 75  Resp: 16 18  Temp:    SpO2: 97% 98%    Last Pain:  Vitals:   12/11/19 1029  TempSrc:   PainSc: 0-No pain                 Lucifer Soja

## 2019-12-11 NOTE — Op Note (Signed)
[facility] Gastroenterology Patient Name: Nicole Osborn Procedure Date: 12/11/2019 9:02 AM MRN: [id number] Account #: [id number] Date of Birth: [birth year] Admit Type: Outpatient Age: 49 Room: [facility] ENDO ROOM 3 Gender: Female Note Status: Finalized Procedure:             Colonoscopy Indications:           Screening for colorectal malignant neoplasm Providers:             Lin Landsman MD, MD Medicines:             Monitored Anesthesia Care Complications:         No immediate complications. Estimated blood loss: None. Procedure:             Pre-Anesthesia Assessment:                        - Prior to the procedure, a History and Physical was                         performed, and patient medications and allergies were                         reviewed. The patient is competent. The risks and                         benefits of the procedure and the sedation options and                         risks were discussed with the patient. All questions                         were answered and informed consent was obtained.                         Patient identification and proposed procedure were                         verified by the physician, the nurse, the                         anesthesiologist, the anesthetist and the technician                         in the pre-procedure area in the procedure room in the                         endoscopy suite. Mental Status Examination: alert and                         oriented. Airway Examination: normal oropharyngeal                         airway and neck mobility. Respiratory Examination:                         clear to auscultation. CV Examination: normal.                         Prophylactic Antibiotics: The patient does not  require                         prophylactic antibiotics. Prior Anticoagulants: The                         patient has taken no previous anticoagulant or                         antiplatelet  agents. ASA Grade Assessment: III - A                         patient with severe systemic disease. After reviewing                         the risks and benefits, the patient was deemed in                         satisfactory condition to undergo the procedure. The                         anesthesia plan was to use monitored anesthesia care                         (MAC). Immediately prior to administration of                         medications, the patient was re-assessed for adequacy                         to receive sedatives. The heart rate, respiratory                         rate, oxygen saturations, blood pressure, adequacy of                         pulmonary ventilation, and response to care were                         monitored throughout the procedure. The physical                         status of the patient was re-assessed after the                         procedure.                        After obtaining informed consent, the colonoscope was                         passed under direct vision. Throughout the procedure,                         the patient's blood pressure, pulse, and oxygen                         saturations were monitored continuously. The  Colonoscope was introduced through the anus and                         advanced to the the terminal ileum, with                         identification of the appendiceal orifice and IC                         valve. The colonoscopy was performed without                         difficulty. The patient tolerated the procedure well.                         The quality of the bowel preparation was evaluated                         using the BBPS [facility] Bowel Preparation Scale) with                         scores of: Right Colon = 3, Transverse Colon = 3 and                         Left Colon = 3 (entire mucosa seen well with no                         residual staining, small fragments of stool or  opaque                         liquid). The total BBPS score equals 9. Findings:      Skin tags were found on perianal exam.      The terminal ileum appeared normal.      Normal mucosa was found in the entire colon. Biopsies for histology were       taken with a cold forceps from the entire colon for evaluation of       microscopic colitis.      The retroflexed view of the distal rectum and anal verge was normal and       showed no anal or rectal abnormalities. Impression:            - Perianal skin tags found on perianal exam.                        - The examined portion of the ileum was normal.                        - Normal mucosa in the entire examined colon. Biopsied.                        - The distal rectum and anal verge are normal on                         retroflexion view. Recommendation:        - Discharge patient to home (with escort).                        -  Resume previous diet today.                        - Continue present medications.                        - Await pathology results.                        - Repeat colonoscopy in 10 years for surveillance.                        - Return to my office as previously scheduled. Procedure Code(s):     --- Professional ---                        [phone], Colonoscopy, flexible; with biopsy, single or                         multiple Diagnosis Code(s):     --- Professional ---                        Z12.11, Encounter for screening for malignant neoplasm                         of colon                        K64.4, Residual hemorrhoidal skin tags CPT copyright 2019 American [employer] Association. All rights reserved. The codes documented in this report are preliminary and upon coder review may  be revised to meet current compliance requirements. Dr. Ulyess Mort Lin Landsman MD, MD 12/11/2019 10:09:30 AM This report has been signed electronically. Number of Addenda: 0 Note Initiated On: 12/11/2019 9:02 AM Scope  Withdrawal Time: 0 hours 9 minutes 42 seconds  Total Procedure Duration: 0 hours 14 minutes 5 seconds  Estimated Blood Loss:  Estimated blood loss: none.      [facility]

## 2019-12-14 ENCOUNTER — Encounter: Payer: Self-pay | Admitting: *Deleted

## 2019-12-14 LAB — SURGICAL PATHOLOGY

## 2019-12-15 ENCOUNTER — Other Ambulatory Visit: Payer: Self-pay

## 2019-12-15 ENCOUNTER — Encounter: Payer: Self-pay | Admitting: Family Medicine

## 2019-12-15 ENCOUNTER — Ambulatory Visit (INDEPENDENT_AMBULATORY_CARE_PROVIDER_SITE_OTHER): Payer: BC Managed Care – PPO | Admitting: Family Medicine

## 2019-12-15 VITALS — BP 124/68 | HR 80 | Temp 98.3°F | Resp 14 | Ht 65.0 in | Wt 232.4 lb

## 2019-12-15 DIAGNOSIS — G43009 Migraine without aura, not intractable, without status migrainosus: Secondary | ICD-10-CM

## 2019-12-15 DIAGNOSIS — Z6839 Body mass index (BMI) 39.0-39.9, adult: Secondary | ICD-10-CM

## 2019-12-15 DIAGNOSIS — B0229 Other postherpetic nervous system involvement: Secondary | ICD-10-CM

## 2019-12-15 DIAGNOSIS — I872 Venous insufficiency (chronic) (peripheral): Secondary | ICD-10-CM

## 2019-12-15 DIAGNOSIS — E782 Mixed hyperlipidemia: Secondary | ICD-10-CM | POA: Diagnosis not present

## 2019-12-15 DIAGNOSIS — F331 Major depressive disorder, recurrent, moderate: Secondary | ICD-10-CM

## 2019-12-15 DIAGNOSIS — E034 Atrophy of thyroid (acquired): Secondary | ICD-10-CM

## 2019-12-15 DIAGNOSIS — I10 Essential (primary) hypertension: Secondary | ICD-10-CM

## 2019-12-15 MED ORDER — GABAPENTIN 100 MG PO CAPS: 100.0000 mg | ORAL_CAPSULE | Freq: Two times a day (BID) | ORAL | 2 refills | Status: DC

## 2019-12-15 MED ORDER — SUMATRIPTAN SUCCINATE 100 MG PO TABS: ORAL_TABLET | ORAL | 1 refills | Status: DC

## 2019-12-15 MED ORDER — HYDROCHLOROTHIAZIDE 12.5 MG PO CAPS: ORAL_CAPSULE | ORAL | 1 refills | Status: DC

## 2019-12-15 NOTE — Progress Notes (Signed)
Name: Nicole Osborn   MRN: [id number]    DOB: [birth year]   Date:12/15/2019       Progress Note  Chief Complaint  Patient presents with  . Follow-up  . Depression    stopped wellbutrin made her feel like something was crawling on her.  See's psych on 4/1  . Hypothyroidism     Subjective:   Nicole Osborn is a 49 y.o. female, presents to clinic for routine follow up on the conditions listed above.  April 1st she has appt with Dr. Nicolasa Ducking She is still dealing with anxiety and depression - she is staying with cymbalta 40 mg daily and did not like Wellbutrin -she does not want to talk about any of it today states that she is going to discuss everything with the psychiatrist.  Dr. Marcelline Mates had recommended a therapist - [facility] ([place]) patient has been working with a therapist over the past couple weeks.  She is currently taking LOA for work  HA's/migraine - on imitrex 100 mg - HA's were severe with increased stress and anxiety  She is going to chiropractor and acupuncture post herpetic neuralgia  Usually only taking 50 mg, occasionally only taking it once and not often doing repeated dose Triggers include weather, stress, genetics Last week was with weather changing she had worse HA Overall with being on leave from work and going to the chiropractor she feels that her headaches are less frequent than what they were over the past several months.  Hypothyroidism: Current Medication Regimen: 112 mcg synthroid Takes medicine daily in the am before breakfast Current Symptoms: denies fatigue, weight changes, heat/cold intolerance, bowel/skin changes or CVS symptoms Most recent results are below; we will be repeating labs today. Lab Results  Component Value Date   TSH 0.63 07/17/2019   T4TOTAL 7.5 02/02/2016     Patient Active Problem List   Diagnosis Date Noted  . Mild episode of recurrent major depressive disorder ([place]) 04/20/2019  .  Urinary incontinence in female 09/01/2018  . Prediabetes 07/25/2018  . Chronic venous insufficiency 07/15/2018  . Lymphedema 07/15/2018  . History of gallbladder disease 02/06/2018  . Morbid obesity ([place]) 02/06/2018  . Bilateral lower extremity edema 01/01/2018  . Depression 09/23/2017  . Hypertension 09/23/2017  . Migraine 09/23/2017  . Varicose veins of both lower extremities 09/06/2017  . Hyperlipidemia 12/23/2016  . Anxiety 12/23/2016  . Increased frequency of headaches 04/28/2016  . Swelling of left ankle joint 04/23/2016  . Hypothyroidism, unspecified 04/23/2016    Past Surgical History:  Procedure Laterality Date  . CHOLECYSTECTOMY N/A 10/08/2017   Procedure: LAPAROSCOPIC CHOLECYSTECTOMY;  Surgeon: Florene Glen, MD;  Location: [facility] ORS;  Service: General;  Laterality: N/A;  . COLONOSCOPY WITH PROPOFOL N/A 12/11/2019   Procedure: COLONOSCOPY WITH PROPOFOL;  Surgeon: Lin Landsman, MD;  Location: [facility] ENDOSCOPY;  Service: Gastroenterology;  Laterality: N/A;  . Removal of extra tooth     age 7    Family History  Problem Relation Age of Onset  . Depression Mother   . Arthritis Mother   . Stroke Father   . Hypertension Father   . Breast cancer Paternal Aunt 19  . Emphysema Maternal Grandmother   . Emphysema Maternal Grandfather   . Dementia Paternal Grandmother   . Asthma Son   . Eczema Son   . Kidney disease Neg Hx   . Bladder Cancer Neg Hx     Social History   Tobacco  Use  . Smoking status: Never Smoker  . Smokeless tobacco: Never Used  Substance Use Topics  . Alcohol use: No  . Drug use: No      Current Outpatient Medications:  .  Cholecalciferol (VITAMIN D3) 2000 units capsule, Take 2,000 Units by mouth daily. , Disp: , Rfl:  .  DULoxetine (CYMBALTA) 20 MG capsule, Take 2 capsules (40 mg total) by mouth daily., Disp: 180 capsule, Rfl: 1 .  gabapentin (NEURONTIN) 100 MG capsule, Take 100 mg by mouth 2 (two) times daily., Disp: , Rfl:  .   hydrochlorothiazide (MICROZIDE) 12.5 MG capsule, TAKE 1 CAPSULE BY MOUTH EVERY DAY, Disp: 90 capsule, Rfl: 1 .  levothyroxine (SYNTHROID) 112 MCG tablet, Take 1 tablet (112 mcg total) by mouth daily before breakfast., Disp: 90 tablet, Rfl: 1 .  Magnesium 250 MG TABS, Take 250 mg by mouth at bedtime. , Disp: , Rfl:  .  Multiple Vitamin (MULTI-VITAMINS) TABS, Take 1 tablet by mouth daily. , Disp: , Rfl:  .  OREGANO PO, Take 150 capsules by mouth daily as needed (immune support)., Disp: , Rfl:  .  promethazine (PHENERGAN) 25 MG suppository, USE ONE SUPPOSITORY RECTALLY EVERY 6 HOURS AS NEEDED FOR NAUSEA FOR UP TO 7 DAYS, Disp: 12 each, Rfl: 2 .  SUMAtriptan (IMITREX) 100 MG tablet, May repeat in 2 hours if headache persists or recurs., Disp: 12 tablet, Rfl: 1 .  Turmeric 500 MG CAPS, Take 500 mg by mouth daily., Disp: , Rfl:  .  buPROPion (WELLBUTRIN XL) 150 MG 24 hr tablet, Take 1 tablet (150 mg total) by mouth daily. (Patient not taking: Reported on 12/15/2019), Disp: 90 tablet, Rfl: 1  Allergies  Allergen Reactions  . Cefaclor Swelling    Lips swelling     Chart Review Today: I personally reviewed active problem list, medication list, allergies, family history, social history, health maintenance, notes from last encounter, lab results, imaging with the patient/caregiver today.  Review of Systems  10 Systems reviewed and are negative for acute change except as noted in the HPI.   Objective:    Vitals:   12/15/19 1433  BP: 124/68  Pulse: (!) 102  Resp: 14  Temp: 98.3 F (36.8 C)  SpO2: 95%  Weight: 232 lb 6.4 oz (105.4 kg)  Height: 5\' 5"  (1.651 m)    Body mass index is 38.67 kg/m.  Physical Exam Vitals and nursing note reviewed.  Constitutional:      General: She is not in acute distress.    Appearance: Normal appearance. She is well-developed. She is obese. She is not ill-appearing, toxic-appearing or diaphoretic.     Interventions: Face mask in place.  HENT:     Head:  Normocephalic and atraumatic.     Right Ear: External ear normal.     Left Ear: External ear normal.  Eyes:     General: Lids are normal. No scleral icterus.       Right eye: No discharge.        Left eye: No discharge.     Conjunctiva/sclera: Conjunctivae normal.  Neck:     Trachea: Phonation normal. No tracheal deviation.  Cardiovascular:     Rate and Rhythm: Regular rhythm. Tachycardia present.     Pulses: Normal pulses.          Radial pulses are 2+ on the right side and 2+ on the left side.       Posterior tibial pulses are 2+ on the right side and 2+  on the left side.     Heart sounds: Normal heart sounds. No murmur. No friction rub. No gallop.   Pulmonary:     Effort: Pulmonary effort is normal. No respiratory distress.     Breath sounds: Normal breath sounds. No stridor. No wheezing, rhonchi or rales.  Chest:     Chest wall: No tenderness.  Abdominal:     General: Bowel sounds are normal. There is no distension.     Palpations: Abdomen is soft.     Tenderness: There is no abdominal tenderness. There is no guarding or rebound.  Musculoskeletal:        General: No deformity. Normal range of motion.     Cervical back: Normal range of motion and neck supple.     Right lower leg: No edema.     Left lower leg: No edema.  Lymphadenopathy:     Cervical: No cervical adenopathy.  Skin:    General: Skin is warm and dry.     Capillary Refill: Capillary refill takes less than 2 seconds.     Coloration: Skin is not jaundiced or pale.     Findings: No rash.  Neurological:     Mental Status: She is alert and oriented to person, place, and time.     Motor: No abnormal muscle tone.     Gait: Gait normal.  Psychiatric:        Attention and Perception: Attention normal.        Mood and Affect: Mood is anxious.        Speech: Speech normal.        Behavior: Behavior normal. Behavior is cooperative.        Thought Content: Thought content does not include homicidal or suicidal  ideation. Thought content does not include homicidal or suicidal plan.     Comments: Slightly guarded, anxious mood        PHQ2/9: Depression screen [facility] 2/9 12/15/2019 11/12/2019 10/13/2019 07/17/2019 07/02/2019  Decreased Interest 2 2 1  0 0  Down, Depressed, Hopeless - 2 0 0 0  PHQ - 2 Score 2 4 1  0 0  Altered sleeping 3 3 - 0 0  Tired, decreased energy 2 3 - 0 0  Change in appetite 2 3 - 0 0  Feeling bad or failure about yourself  2 3 - 0 0  Trouble concentrating 3 3 - 0 0  Moving slowly or fidgety/restless 2 0 - 0 0  Suicidal thoughts 0 0 - 0 0  PHQ-9 Score 16 19 - 0 0  Difficult doing work/chores Somewhat difficult Very difficult - Not difficult at all Not difficult at all  Some recent data might be hidden    phq 9 is positive -patient does not want to discuss it she is scheduled with psychiatry April 1  Fall Risk: Fall Risk  12/15/2019 11/12/2019 10/13/2019 07/17/2019 07/02/2019  Falls in the past year? 0 0 0 0 0  Number falls in past yr: 0 0 0 0 0  Injury with Fall? 0 0 0 0 0  Comment - - - - -  Follow up - - - Falls evaluation completed -    Functional Status Survey: Is the patient deaf or have difficulty hearing?: No Does the patient have difficulty seeing, even when wearing glasses/contacts?: No Does the patient have difficulty concentrating, remembering, or making decisions?: No Does the patient have difficulty walking or climbing stairs?: No Does the patient have difficulty dressing or bathing?: No Does the patient  have difficulty doing errands alone such as visiting a doctor's office or shopping?: No   Assessment & Plan:   1. Essential hypertension BP Readings from Last 3 Encounters:  12/15/19 124/68  12/11/19 126/70  11/04/19 125/85  Hypertension stable, well controlled with medications, encouraged continued lifestyle changes low-salt diet, exercise and weight loss as able. - hydrochlorothiazide (MICROZIDE) 12.5 MG capsule; TAKE 1 CAPSULE BY MOUTH EVERY DAY   Dispense: 90 capsule; Refill: 1  2. Mixed hyperlipidemia History of hyperlipidemia not on any medications, patient does not wish to repeat labs, last labs were from a year ago-patient would prefer to work on her diet and exercise since she is starting to get very motivated and repeat cholesterol panel later this year Lab Results  Component Value Date   CHOL 204 (H) 07/02/2019   HDL 40 (L) 07/02/2019   LDLCALC 125 (H) 07/02/2019   TRIG 255 (H) 07/02/2019   CHOLHDL 5.1 (H) 07/02/2019     3. Migraine without aura and without status migrainosus, not intractable Multiple triggers, refill on Imitrex - SUMAtriptan (IMITREX) 100 MG tablet; May repeat in 2 hours if headache persists or recurs.  Dispense: 12 tablet; Refill: 1  4. Moderate episode of recurrent major depressive disorder ([place]) He needs to have concerning depressive symptoms but does not wish to discuss them any further with me she is going to establish with psychiatry in April.  She did try medication she was given but had some side effects that she did not care for.  She prefers to continue her Cymbalta 40 mg.  I explained that for chronic pain fibromyalgia Cymbalta would be helpful but with her severe anxiety symptoms and depressive symptoms she will likely need a different SSRI added and therapy would continue to be very helpful  5. Chronic venous insufficiency Continue low-salt diet, compression stockings, elevating legs, encouraged her to walk and exercise as able  6. Hypothyroidism due to acquired atrophy of thyroid Due for labs patient concerned about hypothyroid with weight gain worse moods and obesity, no other symptoms concerning for chemical hypothyroid, she has been compliant with her levothyroxine 112 mg and is taking correctly. - TSH  7. Class 2 severe obesity due to excess calories with serious comorbidity and body mass index (BMI) of 39.0 to 39.9 in adult [facility]) Patient was previously referred to medical weight  management.  Since she is on leave of absence she is determined to work on diet and exercise like to lose weight to help her health overall.  8. Postherpetic neuralgia Patient had shingles in the past 1 to 2 months to her face she has seen ophthalmology but was discharged she is still having some swelling above around her eye and continues to have some intermittent pain but is not severe, she was given gabapentin and is still taking but would like to decrease gradually.  Refill given to her encouraged her to wean off by decreasing to 1 capsule at bedtime and then can try every other day at bedtime before discontinuing and encouraged her to follow-up if any changes or concerns. - gabapentin (NEURONTIN) 100 MG capsule; Take 1 capsule (100 mg total) by mouth 2 (two) times daily.  Dispense: 60 capsule; Refill: 2   Return for 6 months routine f/up.   Delsa Grana, PA-C 12/15/19 2:50 PM

## 2019-12-16 ENCOUNTER — Other Ambulatory Visit: Payer: Self-pay | Admitting: Family Medicine

## 2019-12-16 DIAGNOSIS — E034 Atrophy of thyroid (acquired): Secondary | ICD-10-CM

## 2019-12-16 LAB — TSH: TSH: 1.06 mIU/L

## 2019-12-16 MED ORDER — LEVOTHYROXINE SODIUM 112 MCG PO TABS: 112.0000 ug | ORAL_TABLET | Freq: Every day | ORAL | 3 refills | Status: DC

## 2019-12-17 ENCOUNTER — Ambulatory Visit: Payer: BC Managed Care – PPO | Admitting: Gastroenterology

## 2020-02-11 ENCOUNTER — Other Ambulatory Visit: Payer: Self-pay | Admitting: Family Medicine

## 2020-02-11 DIAGNOSIS — F330 Major depressive disorder, recurrent, mild: Secondary | ICD-10-CM

## 2020-04-25 ENCOUNTER — Encounter: Payer: Self-pay | Admitting: Family Medicine

## 2020-05-11 ENCOUNTER — Other Ambulatory Visit: Payer: Self-pay | Admitting: Family Medicine

## 2020-05-11 DIAGNOSIS — G43009 Migraine without aura, not intractable, without status migrainosus: Secondary | ICD-10-CM

## 2020-06-18 ENCOUNTER — Other Ambulatory Visit: Payer: Self-pay | Admitting: Family Medicine

## 2020-06-18 DIAGNOSIS — I10 Essential (primary) hypertension: Secondary | ICD-10-CM

## 2020-07-11 ENCOUNTER — Other Ambulatory Visit: Payer: Self-pay | Admitting: Family Medicine

## 2020-07-11 DIAGNOSIS — G43009 Migraine without aura, not intractable, without status migrainosus: Secondary | ICD-10-CM

## 2020-09-04 ENCOUNTER — Other Ambulatory Visit: Payer: Self-pay | Admitting: Family Medicine

## 2020-09-04 DIAGNOSIS — G43009 Migraine without aura, not intractable, without status migrainosus: Secondary | ICD-10-CM

## 2020-09-05 ENCOUNTER — Encounter: Payer: Self-pay | Admitting: Family Medicine

## 2020-09-05 ENCOUNTER — Other Ambulatory Visit: Payer: Self-pay

## 2020-09-05 ENCOUNTER — Ambulatory Visit: Payer: BC Managed Care – PPO | Admitting: Family Medicine

## 2020-09-05 VITALS — BP 126/74 | HR 100 | Temp 98.0°F | Resp 16 | Ht 65.0 in | Wt 229.3 lb

## 2020-09-05 DIAGNOSIS — I872 Venous insufficiency (chronic) (peripheral): Secondary | ICD-10-CM

## 2020-09-05 DIAGNOSIS — M549 Dorsalgia, unspecified: Secondary | ICD-10-CM

## 2020-09-05 DIAGNOSIS — I10 Essential (primary) hypertension: Secondary | ICD-10-CM

## 2020-09-05 DIAGNOSIS — E782 Mixed hyperlipidemia: Secondary | ICD-10-CM

## 2020-09-05 DIAGNOSIS — G8929 Other chronic pain: Secondary | ICD-10-CM

## 2020-09-05 DIAGNOSIS — Z5181 Encounter for therapeutic drug level monitoring: Secondary | ICD-10-CM

## 2020-09-05 DIAGNOSIS — E034 Atrophy of thyroid (acquired): Secondary | ICD-10-CM

## 2020-09-05 DIAGNOSIS — G43009 Migraine without aura, not intractable, without status migrainosus: Secondary | ICD-10-CM

## 2020-09-05 DIAGNOSIS — Z1231 Encounter for screening mammogram for malignant neoplasm of breast: Secondary | ICD-10-CM

## 2020-09-05 DIAGNOSIS — F331 Major depressive disorder, recurrent, moderate: Secondary | ICD-10-CM | POA: Diagnosis not present

## 2020-09-05 DIAGNOSIS — R7303 Prediabetes: Secondary | ICD-10-CM

## 2020-09-05 DIAGNOSIS — Z6838 Body mass index (BMI) 38.0-38.9, adult: Secondary | ICD-10-CM

## 2020-09-05 MED ORDER — SUMATRIPTAN SUCCINATE 100 MG PO TABS: ORAL_TABLET | ORAL | 1 refills | Status: DC

## 2020-09-05 NOTE — Progress Notes (Signed)
Patient ID: Nicole Osborn, female    DOB: 23-Jul-1971, 49 y.o.   MRN: [id number]  PCP: Delsa Grana, PA-C  Chief Complaint  Patient presents with  . Referral    [facility] for foot pain  . Back Pain    Referral for  possible scoliosis  . Cyst    On thumbs , referral    Subjective:   Nicole Osborn is a 49 y.o. female, presents to clinic with CC of the following:  Pt has a list of things she wants to discuss, requests several referrals, and also has multiple chronic conditions which she has not come for f/up on.  Explained to the patient that if she has multiple new concerns we need to prioritize and can address 1-2 new problems today, said "let me tell you all of them and then you can help me prioritize"  I tried to ask that the most bothersome and/or urgent concerns be addressed 2 of them today -and noted that she is far overdue for her routine follow-up on her chronic conditions  She mentioned the following during office visit today : questions about menopause and hormonal testing Wants rheumatology referral - because she has thumb pain -previously seen by specialist and she was given an injection for ganglion cyst but her hand pain is gotten worse she is concerned about autoimmune disease or arthritis Prior MyChart message was reviewed from August 2021 patient was instructed to schedule an office visit to address: "Nicole Osborn, Good afternoon.   I think around January 2020 I had a referral to an orthopedic doctor (By Sanda Klein I think if I recall correctly), for pain and cysts on thumb joints.  He said it was a ganglion cyst (the main one - left one is bigger and more noticeable), and he did not know why it was on both thumbs.  Plan:  to keep an eye on it, no treatment at the time. As the cysts are bilateral and the pain is worsening on both thumbs (usually one thumb more than the other, alternatingly, that I should see a rheumatologist.  What do you think? Thank you"  Migraine - managed by Dr.  Brigitte Pulse at [facility], pt has been having more than 10+ migraines a months, only on imitrex -she has not gone to follow-up with her specialist because of the cost of specialist visits and co-pays, worsening, wonders if she can try different preventative medications  Hyperlipidemia: No current meds or diet/lifestyle efforts, eats out a lot Last Lipids: Lab Results  Component Value Date   CHOL 204 (H) 07/02/2019   HDL 40 (L) 07/02/2019   LDLCALC 125 (H) 07/02/2019   TRIG 255 (H) 07/02/2019   CHOLHDL 5.1 (H) 07/02/2019  The 10-year ASCVD risk score Mikey Bussing DC Jr., et al., 2013) is: 2.5%   Values used to calculate the score:     Age: 64 years     Sex: Female     Is Non-Hispanic African American: No     Diabetic: No     Tobacco smoker: No     Systolic Blood Pressure: 867 mmHg     Is BP treated: Yes     HDL Cholesterol: 40 mg/dL     Total Cholesterol: 204 mg/dL  Denies: Chest pain, shortness of breath, myalgias, claudication  Hypertension:  Currently managed on HCTZ 12.5 - causing dry mouth Pt reports good med compliance (she did try and hold it for a little bit to see if her dry mouth changed, but  her BP went up Blood pressure today is well controlled. BP Readings from Last 3 Encounters:  09/05/20 126/74  12/15/19 124/68  12/11/19 126/70  Pt denies CP, SOB, exertional sx, LE edema, palpitation, Ha's, visual disturbances, lightheadedness, hypotension, syncope. Dietary efforts for BP?  None  Hypothyroidism: Current Medication Regimen: On 112 mcg synthroid Takes medicine correctly Current Symptoms: denies fatigue, weight changes, heat/cold intolerance, bowel/skin changes or CVS symptoms Most recent results are below; we will be repeating labs today. Lab Results  Component Value Date   TSH 1.06 12/15/2019   T4TOTAL 7.5 02/02/2016   Back pain concern for scoliosis - right back higher than left, shes worried about uneven shoulders and hips, sometimes pain is in right upper and lower  back.  She has been consulting with a lawyer regarding disability and she was recommended to see a specialist or get evaluation for her back  Vascular insufficiency - she wants referral to new vascular specialist -previously was seen locally and had assessment, she reports she had some reflux on her study and over the past several years she has had lower extremity edema and some achiness that seems to moved up her left leg, no asymmetry, no varicose veins or reticular veins or skin changes.  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   Depression screen [facility] 2/9 09/05/2020 12/15/2019 11/12/2019  Decreased Interest 2 2 2   Down, Depressed, Hopeless 1 - 2  PHQ - 2 Score 3 2 4   Altered sleeping 1 3 3   Tired, decreased energy 1 2 3   Change in appetite 0 2 3  Feeling bad or failure about yourself  1 2 3   Trouble concentrating 1 3 3   Moving slowly or fidgety/restless 0 2 0  Suicidal thoughts 0 0 0  PHQ-9 Score 7 16 19   Difficult doing work/chores Somewhat difficult Somewhat difficult Very difficult  Some recent data might be hidden  She is less depressed but she continues to deal with anxiety and stress now having financial limitations GAD 7 : Generalized Anxiety Score 09/05/2020 12/15/2019 10/13/2019  Nervous, Anxious, on Edge 1 3 2   Control/stop worrying 0 1 0  Worry too much - different things 0 3 0  Trouble relaxing 1 2 0  Restless 0 3 0  Easily annoyed or irritable 1 1 0  Afraid - awful might happen 0 2 0  Total GAD 7 Score 3 15 2    Anxiety Difficulty Somewhat difficult Somewhat difficult Very difficult   PHQ-9 and GAD-7 reviewed scores have improved when compared to March and January of this year  Patient also mentions she still has some numbness from when she had shingles and intermittently will have puffiness on her face she is no longer taking gabapentin she no longer has nerve pain but numbness and intermittent swelling persist  Patient Active Problem List   Diagnosis Date Noted  . Mild episode of recurrent major depressive disorder ([place]) 04/20/2019  . Urinary incontinence in female 09/01/2018  . Prediabetes 07/25/2018  . Chronic venous insufficiency 07/15/2018  .  Lymphedema 07/15/2018  . History of gallbladder disease 02/06/2018  . Morbid obesity ([place]) 02/06/2018  . Bilateral lower extremity edema 01/01/2018  . Depression 09/23/2017  . Hypertension 09/23/2017  . Migraine 09/23/2017  . Varicose veins of both lower extremities 09/06/2017  . Hyperlipidemia 12/23/2016  . Anxiety 12/23/2016  . Increased frequency of headaches 04/28/2016  . Swelling of left ankle joint 04/23/2016  . Hypothyroidism, unspecified 04/23/2016      Current Outpatient Medications:  .  Cholecalciferol (VITAMIN D3) 2000 units capsule, Take 2,000 Units by mouth daily. , Disp: , Rfl:  .  DULoxetine (CYMBALTA) 20 MG capsule, TAKE 2 CAPSULES BY MOUTH EVERY DAY, Disp: 180 capsule, Rfl: 3 .  hydrochlorothiazide (MICROZIDE) 12.5 MG capsule, TAKE 1 CAPSULE BY MOUTH EVERY DAY, Disp: 90 capsule, Rfl: 1 .  levothyroxine (SYNTHROID) 112 MCG tablet, Take 1 tablet (112 mcg total) by mouth daily before breakfast., Disp: 90 tablet, Rfl: 3 .  Magnesium 250 MG TABS, Take 250 mg by mouth at bedtime. , Disp: , Rfl:  .  Multiple Vitamin (MULTI-VITAMINS) TABS, Take 1 tablet by mouth daily. , Disp: , Rfl:  .  OREGANO PO, Take 150 capsules by mouth daily as needed (immune support)., Disp: , Rfl:  .  SUMAtriptan (IMITREX) 100 MG tablet, MAY REPEAT IN 2  HOURS IF HEADACHE PERSISTS OR RECURS., Disp: 12 tablet, Rfl: 1 .  Turmeric 500 MG CAPS, Take 500 mg by mouth daily., Disp: , Rfl:  .  gabapentin (NEURONTIN) 100 MG capsule, Take 1 capsule (100 mg total) by mouth 2 (two) times daily. (Patient not taking: Reported on 09/05/2020), Disp: 60 capsule, Rfl: 2 .  promethazine (PHENERGAN) 25 MG suppository, USE ONE SUPPOSITORY RECTALLY EVERY 6 HOURS AS NEEDED FOR NAUSEA FOR UP TO 7 DAYS (Patient not taking: Reported on 09/05/2020), Disp: 12 each, Rfl: 2   Allergies  Allergen Reactions  . Cefaclor Swelling    Lips swelling      Social History   Tobacco Use  . Smoking status: Never Smoker  . Smokeless tobacco: Never Used  Vaping Use  . Vaping Use: Never used  Substance Use Topics  . Alcohol use: No  . Drug use: No      Chart Review Today: I personally reviewed active problem list, medication list, allergies, family history, social history, health maintenance, notes from last encounter, lab results, imaging with the patient/caregiver today.   Review of Systems 10 Systems reviewed and are negative for acute change except as noted in the HPI.      Objective:   Vitals:   09/05/20 1113  BP: 126/74  Pulse: 100  Resp: 16  Temp: 98 F (36.7 C)  TempSrc: Oral  SpO2: 95%  Weight: 229 lb 4.8 oz (104 kg)  Height: 5\' 5"  (1.651 m)    Body mass index is 38.16 kg/m.  Physical Exam Vitals and nursing note reviewed.  Constitutional:      General: She is not in acute distress.    Appearance: Normal appearance. She is well-developed. She is obese. She is not ill-appearing, toxic-appearing or diaphoretic.     Interventions: Face mask in place.  HENT:     Head: Normocephalic and atraumatic.     Right Ear: External ear normal.     Left Ear: External ear normal.  Eyes:     General: Lids are normal. No scleral icterus.       Right eye: No discharge.        Left eye: No discharge.  Conjunctiva/sclera: Conjunctivae normal.  Neck:      Trachea: Phonation normal. No tracheal deviation.  Cardiovascular:     Rate and Rhythm: Normal rate and regular rhythm.     Pulses: Normal pulses.          Radial pulses are 2+ on the right side and 2+ on the left side.       Posterior tibial pulses are 2+ on the right side and 2+ on the left side.     Heart sounds: Normal heart sounds. No murmur heard. No friction rub. No gallop.      Comments: No pitting edema bilaterally to lower extremities Pulmonary:     Effort: Pulmonary effort is normal. No respiratory distress.     Breath sounds: Normal breath sounds. No stridor. No wheezing, rhonchi or rales.  Chest:     Chest wall: No tenderness.  Abdominal:     General: Bowel sounds are normal. There is no distension.     Palpations: Abdomen is soft.  Musculoskeletal:     Right lower leg: No edema.     Left lower leg: No edema.     Comments: No midline tenderness from cervical to lumbar spine, no step-off No paraspinal muscle tenderness from cervical to lumbar spine Grossly normal appearing hips and shoulders Thoracic paraspinal muscles on the right are slightly larger than on the left Negative straight leg raise bilaterally  Skin:    General: Skin is warm and dry.     Coloration: Skin is not jaundiced or pale.     Findings: No rash.  Neurological:     General: No focal deficit present.     Mental Status: She is alert.     Cranial Nerves: Cranial nerves are intact.     Motor: No abnormal muscle tone.     Gait: Gait normal.     Comments: Grossly normal sensation to light touch to all extremities 5/5 strength bilaterally to lower extremities with dorsiflexion and plantarflexion Normal gait  Psychiatric:        Mood and Affect: Mood is anxious.        Behavior: Behavior is cooperative.        Thought Content: Thought content does not include suicidal ideation. Thought content does not include suicidal plan.      Results for orders placed or performed in visit on 12/15/19  TSH   Result Value Ref Range   TSH 1.06 mIU/L       Assessment & Plan:     ICD-10-CM   1. Hypothyroidism due to acquired atrophy of thyroid  E03.4 TSH   on 112 mcg dose daily, recheck labs and adjust dose as needed  2. Essential hypertension  V40 COMPLETE METABOLIC PANEL WITH GFR   stable, well controlled, BP at goal today - some dry mouth with HCTZ, lower dose, discussed options to change management  3. Mixed hyperlipidemia  J81.1 COMPLETE METABOLIC PANEL WITH GFR    Lipid panel   high cholesterol, ascvd risk 2.5% (lower), discussed working on diet choices - to avoid the higher fat foods, pick grilled options/fruit etc  4. Moderate episode of recurrent major depressive disorder ([place])  F33.1    pt talking to a therapist continue current medications, still may need a psychiatrist  5. Chronic venous insufficiency  I87.2 Ambulatory referral to Vascular Surgery   pt reports worsening to left leg, more swelling and achiness to upper inner leg, requests specific referral - put in per request  6. Prediabetes  R73.03 Hemoglobin A1c   recheck  7. Migraine without aura and without status migrainosus, not intractable  G43.009 SUMAtriptan (IMITREX) 100 MG tablet   uncontrolled, very frequent 10+ days a month, on imitrex, managed by neurology - f/up with specialists  8. Chronic right-sided back pain, unspecified back location  M54.9 Ambulatory referral to Orthopedic Surgery   G89.29    No tenderness on exam slightly asymmetrical parapinal muscles in the thoracic area, good ROM, sensation, strength and neg SLR bilaterally - ortho/PT to eval  9. Encounter for medication monitoring  Z51.81 TSH    COMPLETE METABOLIC PANEL WITH GFR    Lipid panel    CBC with Differential/Platelet    Hemoglobin A1c  10. Encounter for screening mammogram for malignant neoplasm of breast  Z12.31 MM 3D SCREEN BREAST BILATERAL   due  11. Class 2 severe obesity with serious comorbidity and body mass index (BMI) of 38.0 to 38.9  in adult, unspecified obesity type ([place])  E66.01    Z68.38    discussed diet choices, increasing exercise, pt has emotional relationship with food - is working with therapist re this   At this time does not feel that patient needed a rheumatology referral, does not seem related to her cyst in her hands she has no other arthralgias, joint swelling or myalgias Encouraged her to return to do a work-up regarding those concerns and see if it is appropriate to refer her to rheumatology I explained that we likely need to do a autoimmune panel and some testing and even then sometimes if there is mild elevation rheumatology may declined to see her  Additionally patient is changing her insurance in about 3 weeks  I explained that all of these referrals may not work on her new insurance so she should check with her husband's insurance to look for in network providers or notify me of her insurance change after it occurs and we can submit the referrals at that time  [facility] may be able to get her in soon so we can put that referral in today for her back concerns there is mild asymmetry but no concerning findings regarding pain mobility, no radiculopathy and she has normal ROM no difficulty with changing positions up and down from the exam table and grossly normal sensation and strength [facility] may be able to assess her spine for scoliosis address her asymmetry and arrange for some physical therapy.  I see no physical limitations or restrictions required with quick examination today  Patient wants a different vascular specialist referral she has a friend who works at this practice, again encouraged her to check that they are in network with her upcoming and new insurance coverage referral put in today but may need to redo this  Do think patient needs a psychiatrist to see her and continue to work with her anxiety and depression symptoms and adjust her medications she did not follow-up after the last  medication adjustment, it was added to an allergy list today, she does have improving PHQ and GAD-7 screening but she still reports multiple symptoms, concerns she is still very anxious about her health, is not currently working and avoiding the stress of her job has not seemed to dramatically change her worries her ability to function or her somatic symptoms.     Greater than 50% of this visit was spent in direct face-to-face counseling, obtaining history and physical, discussing and educating pt on treatment plan.  Total time of this visit was 60+  min.  Remainder of time involved but was not limited to reviewing chart (recent and pertinent OV notes and labs), documentation in EMR, and coordinating care and treatment plan.    Delsa Grana, PA-C 09/05/20 11:19 AM

## 2020-09-06 ENCOUNTER — Encounter: Payer: Self-pay | Admitting: Family Medicine

## 2020-09-06 LAB — HEMOGLOBIN A1C
Hgb A1c MFr Bld: 6.1 % of total Hgb — ABNORMAL HIGH (ref <5.7)
Mean Plasma Glucose: 128 mg/dL
eAG (mmol/L): 7.1 mmol/L

## 2020-09-06 LAB — COMPLETE METABOLIC PANEL WITH GFR
AG Ratio: 1.6 (calc) (ref 1.0–2.5)
ALT: 15 U/L (ref 6–29)
AST: 16 U/L (ref 10–35)
Albumin: 4.5 g/dL (ref 3.6–5.1)
Alkaline phosphatase (APISO): 83 U/L (ref 37–153)
BUN: 13 mg/dL (ref 7–25)
CO2: 28 mmol/L (ref 20–32)
Calcium: 10.1 mg/dL (ref 8.6–10.4)
Chloride: 105 mmol/L (ref 98–110)
Creat: 0.82 mg/dL (ref 0.50–1.05)
GFR, Est African American: 97 mL/min/{1.73_m2} (ref >=60)
GFR, Est Non African American: 83 mL/min/{1.73_m2} (ref >=60)
Globulin: 2.8 g/dL (calc) (ref 1.9–3.7)
Glucose, Bld: 104 mg/dL — ABNORMAL HIGH (ref 65–99)
Potassium: 4.6 mmol/L (ref 3.5–5.3)
Sodium: 142 mmol/L (ref 135–146)
Total Bilirubin: 0.3 mg/dL (ref 0.2–1.2)
Total Protein: 7.3 g/dL (ref 6.1–8.1)

## 2020-09-06 LAB — CBC WITH DIFFERENTIAL/PLATELET
Absolute Monocytes: 546 cells/uL (ref 200–950)
Basophils Absolute: 90 cells/uL (ref 0–200)
Basophils Relative: 1.5 %
Eosinophils Absolute: 258 cells/uL (ref 15–500)
Eosinophils Relative: 4.3 %
HCT: 42 % (ref 35.0–45.0)
Hemoglobin: 14.2 g/dL (ref 11.7–15.5)
Lymphs Abs: 1866 cells/uL (ref 850–3900)
MCH: 30.3 pg (ref 27.0–33.0)
MCHC: 33.8 g/dL (ref 32.0–36.0)
MCV: 89.7 fL (ref 80.0–100.0)
MPV: 10.3 fL (ref 7.5–12.5)
Monocytes Relative: 9.1 %
Neutro Abs: 3240 cells/uL (ref 1500–7800)
Neutrophils Relative %: 54 %
Platelets: 241 10*3/uL (ref 140–400)
RBC: 4.68 10*6/uL (ref 3.80–5.10)
RDW: 13 % (ref 11.0–15.0)
Total Lymphocyte: 31.1 %
WBC: 6 10*3/uL (ref 3.8–10.8)

## 2020-09-06 LAB — LIPID PANEL
Cholesterol: 235 mg/dL — ABNORMAL HIGH (ref <200)
HDL: 40 mg/dL — ABNORMAL LOW (ref >=50)
LDL Cholesterol (Calc): 148 mg/dL (calc) — ABNORMAL HIGH
Non-HDL Cholesterol (Calc): 195 mg/dL (calc) — ABNORMAL HIGH (ref <130)
Total CHOL/HDL Ratio: 5.9 (calc) — ABNORMAL HIGH (ref <5.0)
Triglycerides: 287 mg/dL — ABNORMAL HIGH (ref <150)

## 2020-09-06 LAB — TSH: TSH: 0.69 mIU/L

## 2020-11-02 DIAGNOSIS — M545 Low back pain, unspecified: Secondary | ICD-10-CM | POA: Insufficient documentation

## 2020-12-18 ENCOUNTER — Other Ambulatory Visit: Payer: Self-pay | Admitting: Family Medicine

## 2020-12-18 DIAGNOSIS — F330 Major depressive disorder, recurrent, mild: Secondary | ICD-10-CM

## 2020-12-19 NOTE — Telephone Encounter (Signed)
lvm to let pt know that prescription has been sent to pharmacy. Asked her to return call at earliest convenience to schedule appointment in May.

## 2020-12-19 NOTE — Telephone Encounter (Signed)
Pt needs to schedule an appt in may

## 2020-12-27 ENCOUNTER — Other Ambulatory Visit: Payer: Self-pay | Admitting: Family Medicine

## 2020-12-27 DIAGNOSIS — I10 Essential (primary) hypertension: Secondary | ICD-10-CM

## 2021-01-07 ENCOUNTER — Telehealth: Payer: Self-pay | Admitting: Family Medicine

## 2021-01-07 DIAGNOSIS — E034 Atrophy of thyroid (acquired): Secondary | ICD-10-CM

## 2021-01-09 NOTE — Telephone Encounter (Signed)
Pt does not have appt schedule was supposed to f/u in June please schedule

## 2021-01-09 NOTE — Telephone Encounter (Signed)
lvm to call and schedule an appt.

## 2021-01-16 ENCOUNTER — Encounter: Payer: Self-pay | Admitting: Family Medicine

## 2021-01-16 ENCOUNTER — Other Ambulatory Visit: Payer: Self-pay | Admitting: Family Medicine

## 2021-01-16 DIAGNOSIS — F330 Major depressive disorder, recurrent, mild: Secondary | ICD-10-CM

## 2021-01-17 MED ORDER — DULOXETINE HCL 20 MG PO CPEP: 40.0000 mg | ORAL_CAPSULE | Freq: Every day | ORAL | 0 refills | Status: DC

## 2021-02-08 ENCOUNTER — Ambulatory Visit: Payer: 59 | Admitting: Family Medicine

## 2021-02-08 ENCOUNTER — Encounter: Payer: Self-pay | Admitting: Family Medicine

## 2021-02-08 ENCOUNTER — Other Ambulatory Visit: Payer: Self-pay

## 2021-02-08 VITALS — BP 128/82 | HR 98 | Temp 98.1°F | Resp 18 | Ht 65.0 in | Wt 234.0 lb

## 2021-02-08 DIAGNOSIS — J310 Chronic rhinitis: Secondary | ICD-10-CM

## 2021-02-08 DIAGNOSIS — Z6838 Body mass index (BMI) 38.0-38.9, adult: Secondary | ICD-10-CM

## 2021-02-08 DIAGNOSIS — F331 Major depressive disorder, recurrent, moderate: Secondary | ICD-10-CM

## 2021-02-08 DIAGNOSIS — R7303 Prediabetes: Secondary | ICD-10-CM

## 2021-02-08 DIAGNOSIS — J343 Hypertrophy of nasal turbinates: Secondary | ICD-10-CM

## 2021-02-08 DIAGNOSIS — F419 Anxiety disorder, unspecified: Secondary | ICD-10-CM

## 2021-02-08 DIAGNOSIS — E782 Mixed hyperlipidemia: Secondary | ICD-10-CM

## 2021-02-08 DIAGNOSIS — Z5181 Encounter for therapeutic drug level monitoring: Secondary | ICD-10-CM

## 2021-02-08 DIAGNOSIS — I10 Essential (primary) hypertension: Secondary | ICD-10-CM | POA: Diagnosis not present

## 2021-02-08 DIAGNOSIS — J329 Chronic sinusitis, unspecified: Secondary | ICD-10-CM

## 2021-02-08 DIAGNOSIS — E66812 Obesity, class 2: Secondary | ICD-10-CM

## 2021-02-08 DIAGNOSIS — R591 Generalized enlarged lymph nodes: Secondary | ICD-10-CM

## 2021-02-08 DIAGNOSIS — E034 Atrophy of thyroid (acquired): Secondary | ICD-10-CM | POA: Diagnosis not present

## 2021-02-08 NOTE — Progress Notes (Signed)
Patient ID: Nicole Osborn, female    DOB: October 15, 1970, 50 y.o.   MRN: [id number]  PCP: Delsa Grana, PA-C  Chief Complaint  Patient presents with  . Hypothyroidism  . Hyperlipidemia  . Hypertension  . Adenopathy    Lymph nodes swollen right side worse then left    Subjective:   Nicole Osborn is a 50 y.o. female, presents to clinic with CC of the following:  Again presents with acute and chronic complaints  HPI  Lymphadenopathy-patient states she has had swollen lymph nodes under her jaw to her anterior neck over the past year and ever since having shingles and postherpetic neuralgia she also is concerned about her eyes being intermittently puffy she states that her right lymphadenopathy is worse than her left but she does feel overall it is in improving a little bit, she mentioned this to her neurologist but she is not addressed here in the past year, she endorses recent bronchitis, COVID and a lingering cough and some allergies that have began to improve she has not had any unintentional weight loss, sweats, hemoptysis, chest pain, rash   Uncontrolled anxiety and depression - on cymbalta - advised a long time ago she needed formal psychiatry but she has not gone, on cymbalta - I recommended other meds, but not willing to try GAD 7 : Generalized Anxiety Score 02/08/2021 09/05/2020 12/15/2019 10/13/2019  Nervous, Anxious, on Edge 2 1 3 2   Control/stop worrying 1 0 1 0  Worry too much - different things 2 0 3 0  Trouble relaxing 3 1 2  0  Restless 1 0 3 0  Easily annoyed or irritable 2 1 1  0  Afraid - awful might happen 1 0 2 0  Total GAD 7 Score 12 3 15 2   Anxiety Difficulty Somewhat difficult Somewhat difficult Somewhat difficult Very difficult   Depression screen [facility] 2/9 02/08/2021 09/05/2020 12/15/2019  Decreased Interest 2 2 2   Down, Depressed, Hopeless 1 1 -  PHQ - 2 Score 3 3 2   Altered sleeping 3 1 3   Tired, decreased energy 3 1 2   Change in appetite 3 0 2  Feeling bad  or failure about yourself  2 1 2   Trouble concentrating 2 1 3   Moving slowly or fidgety/restless 1 0 2  Suicidal thoughts 0 0 0  PHQ-9 Score 17 7 16   Difficult doing work/chores Very difficult Somewhat difficult Somewhat difficult  Some recent data might be hidden   Gad 7 and phq reviewed - both positive and worse than last OV She endorses current significant financial stress and anxiety her anxiety is related to GI symptoms usually frequent diarrhea and also triggers her migraine headaches she has discussed this with neurology who is referred her to discuss with a psychiatrist.  She was working with a therapist that was available for low cost through her work but that ended about 6 months ago  She has HLD, lipids last checked about 6 months ago, elevated, not on meds, but ASCVD risk is overall still low enough that statin is not indicated  Hypertension:  Currently managed on 12.5 mg HCTZ - doesn't like SE of dry mouth but she continues to take, she has been using Biotene and other over-the-counter mouthwashes to try and help manage the intermittent dry mouth and at this time she does not want to change her medications Pt reports good med compliance and denies any SE.   Blood pressure today is  controlled. BP Readings from Last  3 Encounters:  02/08/21 128/82  09/05/20 126/74  12/15/19 124/68  Pt denies CP, SOB, exertional sx, LE edema, palpitation, Ha's, visual disturbances, lightheadedness, hypotension, syncope   Hypothyroidism: Current Medication Regimen: 112 mcg synthroid Current Symptoms: denies fatigue, weight changes, heat/cold intolerance, bowel/skin changes or CVS symptoms  Patient denies and says I should just check her lab work today she has had slight weight gain and worsening mood Most recent results are below; we will be repeating labs today. Lab Results  Component Value Date   TSH 0.69 09/05/2020   T4TOTAL 7.5 02/02/2016   Obesity:  Weight has gone up since last OV -  some stress she again endorses a emotional relationship with food that worsens when she is stressed she is starting to go walking which she says makes her low bit more hungry Wt Readings from Last 5 Encounters:  02/08/21 234 lb (106.1 kg)  09/05/20 229 lb 4.8 oz (104 kg)  12/15/19 232 lb 6.4 oz (105.4 kg)  12/11/19 226 lb (102.5 kg)  11/12/19 227 lb (103 kg)   BMI Readings from Last 5 Encounters:  02/08/21 38.94 kg/m  09/05/20 38.16 kg/m  12/15/19 38.67 kg/m  12/11/19 37.61 kg/m  11/12/19 5275.98 kg/m   Prediabetes: Not on meds Lab Results  Component Value Date   HGBA1C 6.1 (H) 09/05/2020   Cough - covid and sinus/ear infections some lingering cough no CP SOB sweats, DOE, palpitations Cough is nonproductive, has been gradually improving, worse at night, she is not using any allergy medications or sinus meds.  She did have COVID and she states sinus infection and ear infections and decreased hearing and followed up with ENT and was treated with antibiotics a few times she states that she continues to get intermittent pressure in her left ear  Patient Active Problem List   Diagnosis Date Noted  . Mild episode of recurrent major depressive disorder ([place]) 04/20/2019  . Urinary incontinence in female 09/01/2018  . Prediabetes 07/25/2018  . Chronic venous insufficiency 07/15/2018  . Lymphedema 07/15/2018  . History of gallbladder disease 02/06/2018  . Morbid obesity ([place]) 02/06/2018  . Bilateral lower extremity edema 01/01/2018  . Depression 09/23/2017  . Hypertension 09/23/2017  . Migraine 09/23/2017  . Varicose veins of both lower extremities 09/06/2017  . Hyperlipidemia 12/23/2016  . Anxiety 12/23/2016  . Increased frequency of headaches 04/28/2016  . Swelling of left ankle joint 04/23/2016  . Hypothyroidism, unspecified 04/23/2016      Current Outpatient Medications:  .  Cholecalciferol (VITAMIN D3) 2000 units capsule, Take 2,000 Units by mouth daily. , Disp: , Rfl:   .  DULoxetine (CYMBALTA) 20 MG capsule, Take 2 capsules (40 mg total) by mouth daily., Disp: 180 capsule, Rfl: 0 .  ELDERBERRY PO, Take by mouth., Disp: , Rfl:  .  hydrochlorothiazide (MICROZIDE) 12.5 MG capsule, TAKE 1 CAPSULE BY MOUTH EVERY DAY, Disp: 90 capsule, Rfl: 1 .  levothyroxine (SYNTHROID) 112 MCG tablet, TAKE 1 TABLET (112 MCG TOTAL) BY MOUTH DAILY BEFORE BREAKFAST., Disp: 30 tablet, Rfl: 1 .  meloxicam (MOBIC) 7.5 MG tablet, Take 7.5 mg by mouth daily., Disp: , Rfl:  .  Multiple Vitamin (MULTI-VITAMINS) TABS, Take 1 tablet by mouth daily. , Disp: , Rfl:  .  Omega-3 Fatty Acids (FISH OIL) 1000 MG CAPS, Take by mouth., Disp: , Rfl:  .  OREGANO PO, Take 150 capsules by mouth daily as needed (immune support)., Disp: , Rfl:  .  SUMAtriptan (IMITREX) 100 MG tablet, May repeat in  2 hours if headache persists or recurs., Disp: 12 tablet, Rfl: 1 .  tiZANidine (ZANAFLEX) 4 MG capsule, tizanidine 4 mg capsule  TAKE 1 CAPSULE BY MOUTH EVERY DAY AT BEDTIME AS NEEDED, Disp: , Rfl:  .  Turmeric 500 MG CAPS, Take 500 mg by mouth daily., Disp: , Rfl:  .  Magnesium 250 MG TABS, Take 250 mg by mouth at bedtime.  (Patient not taking: Reported on 02/08/2021), Disp: , Rfl:    Allergies  Allergen Reactions  . Wellbutrin [Bupropion] Other (See Comments)    Reaction - "insects crawling in my head"  . Cefaclor Swelling    Lips swelling      Social History   Tobacco Use  . Smoking status: Never Smoker  . Smokeless tobacco: Never Used  Vaping Use  . Vaping Use: Never used  Substance Use Topics  . Alcohol use: No  . Drug use: No      Chart Review Today: I personally reviewed active problem list, medication list, allergies, family history, social history, health maintenance, notes from last encounter, lab results, imaging with the patient/caregiver today.   Review of Systems  Constitutional: Negative.   HENT: Negative.   Eyes: Negative.   Respiratory: Negative.   Cardiovascular:  Negative.   Gastrointestinal: Negative.   Endocrine: Negative.   Genitourinary: Negative.   Musculoskeletal: Negative.   Skin: Negative.   Allergic/Immunologic: Negative.   Neurological: Negative.   Hematological: Negative.   Psychiatric/Behavioral: Negative.   All other systems reviewed and are negative.      Objective:   Vitals:   02/08/21 1522  BP: 128/82  Pulse: 98  Resp: 18  Temp: 98.1 F (36.7 C)  TempSrc: Oral  SpO2: 98%  Weight: 234 lb (106.1 kg)  Height: 5\' 5"  (1.651 m)    Body mass index is 38.94 kg/m.  Physical Exam Vitals and nursing note reviewed.  Constitutional:      General: She is not in acute distress.    Appearance: Normal appearance. She is well-developed. She is not ill-appearing, toxic-appearing or diaphoretic.     Interventions: Face mask in place.  HENT:     Head: Normocephalic and atraumatic.     Right Ear: External ear normal.     Left Ear: External ear normal.     Nose: Mucosal edema present. No congestion or rhinorrhea.     Right Turbinates: Enlarged and pale.     Left Turbinates: Enlarged and pale.     Mouth/Throat:     Mouth: Mucous membranes are moist.     Pharynx: Posterior oropharyngeal erythema present. No pharyngeal swelling, oropharyngeal exudate or uvula swelling.     Tonsils: No tonsillar exudate. 0 on the right. 0 on the left.  Eyes:     General: Lids are normal. No scleral icterus.       Right eye: No discharge.        Left eye: No discharge.     Conjunctiva/sclera: Conjunctivae normal.  Neck:     Trachea: Trachea and phonation normal. No tracheal deviation.  Cardiovascular:     Rate and Rhythm: Normal rate and regular rhythm.     Pulses: Normal pulses.          Radial pulses are 2+ on the right side and 2+ on the left side.       Posterior tibial pulses are 2+ on the right side and 2+ on the left side.     Heart sounds: Normal heart sounds. No murmur heard.  No friction rub. No gallop.   Pulmonary:     Effort:  Pulmonary effort is normal. No tachypnea, accessory muscle usage or respiratory distress.     Breath sounds: Normal breath sounds. No stridor, decreased air movement or transmitted upper airway sounds. No decreased breath sounds, wheezing, rhonchi or rales.  Chest:     Chest wall: No tenderness.  Breasts:     Right: No supraclavicular adenopathy.     Left: No supraclavicular adenopathy.    Abdominal:     General: Bowel sounds are normal. There is no distension.     Palpations: Abdomen is soft.  Musculoskeletal:     Cervical back: Normal range of motion.     Right lower leg: No edema.     Left lower leg: No edema.  Lymphadenopathy:     Head:     Right side of head: Submandibular and tonsillar adenopathy present. No submental, preauricular, posterior auricular or occipital adenopathy.     Left side of head: Submandibular and tonsillar adenopathy present. No submental, preauricular, posterior auricular or occipital adenopathy.     Cervical: No cervical adenopathy.     Right cervical: No superficial, deep or posterior cervical adenopathy.    Left cervical: No superficial, deep or posterior cervical adenopathy.     Upper Body:     Right upper body: No supraclavicular adenopathy.     Left upper body: No supraclavicular adenopathy.     Comments: Palpable bilateral lymph nodes as noted above, non-tender  Skin:    General: Skin is warm and dry.     Coloration: Skin is not jaundiced or pale.     Findings: No rash.  Neurological:     Mental Status: She is alert.     Motor: No abnormal muscle tone.     Gait: Gait normal.  Psychiatric:        Attention and Perception: Attention normal.        Mood and Affect: Mood is anxious. Mood is not depressed.        Speech: Speech normal.        Behavior: Behavior normal. Behavior is cooperative.        Thought Content: Thought content normal. Thought content does not include homicidal or suicidal ideation. Thought content does not include homicidal  or suicidal plan.         Results for orders placed or performed in visit on 09/05/20  TSH  Result Value Ref Range   TSH 0.69 mIU/L  COMPLETE METABOLIC PANEL WITH GFR  Result Value Ref Range   Glucose, Bld 104 (H) 65 - 99 mg/dL   BUN 13 7 - 25 mg/dL   Creat 0.82 0.50 - 1.05 mg/dL   GFR, Est Non African American 83 > OR = 60 mL/min/1.42m2   GFR, Est African American 97 > OR = 60 mL/min/1.50m2   BUN/Creatinine Ratio NOT APPLICABLE 6 - 22 (calc)   Sodium 142 135 - 146 mmol/L   Potassium 4.6 3.5 - 5.3 mmol/L   Chloride 105 98 - 110 mmol/L   CO2 28 20 - 32 mmol/L   Calcium 10.1 8.6 - 10.4 mg/dL   Total Protein 7.3 6.1 - 8.1 g/dL   Albumin 4.5 3.6 - 5.1 g/dL   Globulin 2.8 1.9 - 3.7 g/dL (calc)   AG Ratio 1.6 1.0 - 2.5 (calc)   Total Bilirubin 0.3 0.2 - 1.2 mg/dL   Alkaline phosphatase (APISO) 83 37 - 153 U/L   AST 16 10 -  35 U/L   ALT 15 6 - 29 U/L  Lipid panel  Result Value Ref Range   Cholesterol 235 (H) <200 mg/dL   HDL 40 (L) > OR = 50 mg/dL   Triglycerides 287 (H) <150 mg/dL   LDL Cholesterol (Calc) 148 (H) mg/dL (calc)   Total CHOL/HDL Ratio 5.9 (H) <5.0 (calc)   Non-HDL Cholesterol (Calc) 195 (H) <130 mg/dL (calc)  CBC with Differential/Platelet  Result Value Ref Range   WBC 6.0 3.8 - 10.8 Thousand/uL   RBC 4.68 3.80 - 5.10 Million/uL   Hemoglobin 14.2 11.7 - 15.5 g/dL   HCT 42.0 35.0 - 45.0 %   MCV 89.7 80.0 - 100.0 fL   MCH 30.3 27.0 - 33.0 pg   MCHC 33.8 32.0 - 36.0 g/dL   RDW 13.0 11.0 - 15.0 %   Platelets 241 140 - 400 Thousand/uL   MPV 10.3 7.5 - 12.5 fL   Neutro Abs 3,240 1,500 - 7,800 cells/uL   Lymphs Abs 1,866 850 - 3,900 cells/uL   Absolute Monocytes 546 200 - 950 cells/uL   Eosinophils Absolute 258 15 - 500 cells/uL   Basophils Absolute 90 0 - 200 cells/uL   Neutrophils Relative % 54 %   Total Lymphocyte 31.1 %   Monocytes Relative 9.1 %   Eosinophils Relative 4.3 %   Basophils Relative 1.5 %  Hemoglobin A1c  Result Value Ref Range   Hgb  A1c MFr Bld 6.1 (H) <5.7 % of total Hgb   Mean Plasma Glucose 128 mg/dL   eAG (mmol/L) 7.1 mmol/L       Assessment & Plan:   1. Hypothyroidism due to acquired atrophy of thyroid Good compliance with medications, patient unable to say whether or not she feels euthyroid -her mood is definitely worse than it was last office visit and weight has only gone up a small amount no other bowel hair or skin changes will recheck labs and adjust medication per lab results - TSH  2. Anxiety disorder, unspecified type Significant continued anxiety symptoms, with multiple somatic symptoms -her neurologist encouraged her to remain on Cymbalta so we will not change that medication at this time and it does sound like she may have a psychiatry consult coming up that was arranged by neurology?  GAD 7 reviewed today, do advise patient get formal psychiatry evaluation, medical management and likely some additional therapy - Ambulatory referral to Psychiatry  3. Moderate episode of recurrent major depressive disorder (HCC) Depressive symptoms much worse see above - TSH - Ambulatory referral to Psychiatry  4. Essential hypertension Stable, well-controlled, blood pressure at goal today patient would like to continue hydrochlorothiazide although I did offer her other medications which may control her blood pressure well and not have as many side effects -she declined at this time - COMPLETE METABOLIC PANEL WITH GFR  5. Mixed hyperlipidemia Very high cholesterol but low ASCVD score continue to work on healthy diet and exercise as able, just check labs with her last office visit we will check at her next follow-up visit or physical - COMPLETE METABOLIC PANEL WITH GFR  6. Prediabetes Not on meds, some increase in her weight, recheck - COMPLETE METABOLIC PANEL WITH GFR - Hemoglobin A1c  7. Class 2 severe obesity with serious comorbidity and body mass index (BMI) of 38.0 to 38.9 in adult, unspecified obesity type  (HCC) Slight weight increase -very related to her moods continue healthy efforts  8. Lymphadenopathy She endorses onset of swollen lymph nodes about  1 year ago, on exam I do not feel anything out of the ordinary but I did offer to her to get an ultrasound to evaluate the lymph nodes since they have been ongoing for 1 year.  Patient is concerned about the cost she will check with her insurance - CBC with Differential/Platelet - US SOFT TISSUE HEAD & NECK (NON-THYROID); Future  9. Encounter for medication monitoring - CBC with Differential/Platelet - COMPLETE METABOLIC PANEL WITH GFR - Hemoglobin A1c - TSH  10. Rhinosinusitis Significant nasal allergies noted on exam-I wrote down for the patient on her after visit summary for her to start a daily intranasal steroid spray like Flonase, Nasonex or Nasacort and to take a daily antihistamine such as Claritin, Zyrtec Allegra or equivalent generic  11. Hypertrophy of nasal turbinates Very enlarged inferior nasal turbinates - nearly touching nasal septum bilaterally may easily trigger sinus headaches or migraines -start treatment as noted in #10  Return in about 6 months (around 08/11/2021) for Routine follow-up, Annual Physical.     Delsa Grana, PA-C 02/08/21 3:30 PM

## 2021-02-09 LAB — CBC WITH DIFFERENTIAL/PLATELET
Absolute Monocytes: 566 cells/uL (ref 200–950)
Basophils Absolute: 90 cells/uL (ref 0–200)
Basophils Relative: 1.3 %
Eosinophils Absolute: 228 cells/uL (ref 15–500)
Eosinophils Relative: 3.3 %
HCT: 39 % (ref 35.0–45.0)
Hemoglobin: 13.2 g/dL (ref 11.7–15.5)
Lymphs Abs: 2470 cells/uL (ref 850–3900)
MCH: 30.3 pg (ref 27.0–33.0)
MCHC: 33.8 g/dL (ref 32.0–36.0)
MCV: 89.4 fL (ref 80.0–100.0)
MPV: 10.7 fL (ref 7.5–12.5)
Monocytes Relative: 8.2 %
Neutro Abs: 3547 cells/uL (ref 1500–7800)
Neutrophils Relative %: 51.4 %
Platelets: 240 10*3/uL (ref 140–400)
RBC: 4.36 10*6/uL (ref 3.80–5.10)
RDW: 13.1 % (ref 11.0–15.0)
Total Lymphocyte: 35.8 %
WBC: 6.9 10*3/uL (ref 3.8–10.8)

## 2021-02-09 LAB — TSH: TSH: 1.25 mIU/L

## 2021-02-09 LAB — COMPLETE METABOLIC PANEL WITH GFR
AG Ratio: 1.8 (calc) (ref 1.0–2.5)
ALT: 12 U/L (ref 6–29)
AST: 13 U/L (ref 10–35)
Albumin: 4.6 g/dL (ref 3.6–5.1)
Alkaline phosphatase (APISO): 86 U/L (ref 37–153)
BUN: 15 mg/dL (ref 7–25)
CO2: 26 mmol/L (ref 20–32)
Calcium: 9.8 mg/dL (ref 8.6–10.4)
Chloride: 103 mmol/L (ref 98–110)
Creat: 0.71 mg/dL (ref 0.50–1.05)
GFR, Est African American: 114 mL/min/{1.73_m2} (ref >=60)
GFR, Est Non African American: 99 mL/min/{1.73_m2} (ref >=60)
Globulin: 2.5 g/dL (calc) (ref 1.9–3.7)
Glucose, Bld: 131 mg/dL — ABNORMAL HIGH (ref 65–99)
Potassium: 3.6 mmol/L (ref 3.5–5.3)
Sodium: 140 mmol/L (ref 135–146)
Total Bilirubin: 0.4 mg/dL (ref 0.2–1.2)
Total Protein: 7.1 g/dL (ref 6.1–8.1)

## 2021-02-09 LAB — HEMOGLOBIN A1C
Hgb A1c MFr Bld: 6 % of total Hgb — ABNORMAL HIGH (ref <5.7)
Mean Plasma Glucose: 126 mg/dL
eAG (mmol/L): 7 mmol/L

## 2021-02-10 ENCOUNTER — Other Ambulatory Visit: Payer: Self-pay | Admitting: Family Medicine

## 2021-02-10 DIAGNOSIS — E034 Atrophy of thyroid (acquired): Secondary | ICD-10-CM

## 2021-02-10 DIAGNOSIS — G43009 Migraine without aura, not intractable, without status migrainosus: Secondary | ICD-10-CM

## 2021-02-10 MED ORDER — LEVOTHYROXINE SODIUM 112 MCG PO TABS: 112.0000 ug | ORAL_TABLET | Freq: Every day | ORAL | 3 refills | Status: DC

## 2021-03-24 ENCOUNTER — Telehealth: Payer: Self-pay

## 2021-03-24 ENCOUNTER — Ambulatory Visit: Payer: Self-pay | Attending: Family Medicine

## 2021-03-24 NOTE — Telephone Encounter (Signed)
Copied from [place] [id number]. Topic: General - Inquiry >> Mar 24, 2021  3:01 PM Greggory Keen D wrote: Reason for CRM: Kim with [place] [employer] called to say the patient did not come to her appt today for the [place].

## 2021-04-30 ENCOUNTER — Other Ambulatory Visit: Payer: Self-pay | Admitting: Family Medicine

## 2021-04-30 DIAGNOSIS — F330 Major depressive disorder, recurrent, mild: Secondary | ICD-10-CM

## 2021-07-03 ENCOUNTER — Other Ambulatory Visit: Payer: Self-pay | Admitting: Family Medicine

## 2021-07-03 DIAGNOSIS — G43009 Migraine without aura, not intractable, without status migrainosus: Secondary | ICD-10-CM

## 2021-08-12 ENCOUNTER — Other Ambulatory Visit: Payer: Self-pay | Admitting: Family Medicine

## 2021-08-12 DIAGNOSIS — F330 Major depressive disorder, recurrent, mild: Secondary | ICD-10-CM

## 2021-08-12 NOTE — Telephone Encounter (Signed)
Pt due for appt-  sent a MyChart message to pt to make appointment for refills.  Last RF 05/01/21 #60 2 RF  Sent pt a message via MyChart to make appointment. Refilled 30 day courtesy RF.  Requested Prescriptions  Pending Prescriptions Disp Refills   DULoxetine (CYMBALTA) 20 MG capsule [Pharmacy Med Name: DULOXETINE HCL DR 20 MG CAP] 60 capsule 0    Sig: TAKE 2 CAPSULES BY MOUTH EVERY DAY     Psychiatry: Antidepressants - SNRI Failed - 08/12/2021 10:35 AM      Failed - Valid encounter within last 6 months    Recent Outpatient Visits           6 months ago Hypothyroidism due to acquired atrophy of thyroid   [name], [name], PA-C   11 months ago Hypothyroidism due to acquired atrophy of thyroid   [name], PA-C   1 year ago Essential hypertension   [name], [name], PA-C   1 year ago Moderate episode of recurrent major depressive disorder [name], [name], PA-C   1 year ago Bloody sputum   [name], [name], MD              Passed - Completed PHQ-2 or PHQ-9 in the last 360 days      Passed - Last BP in normal range    BP Readings from Last 1 Encounters:  02/08/21 128/82

## 2021-08-14 NOTE — Telephone Encounter (Signed)
Lvm for pt to call the office to schedule an appt for med refill

## 2021-08-21 ENCOUNTER — Other Ambulatory Visit: Payer: Self-pay | Admitting: Family Medicine

## 2021-08-21 DIAGNOSIS — F330 Major depressive disorder, recurrent, mild: Secondary | ICD-10-CM

## 2021-08-21 NOTE — Telephone Encounter (Signed)
Medication Refill - Medication: DULoxetine (CYMBALTA) 20 MG capsule  Has the patient contacted their pharmacy? Yes.   (Agent: If no, request that the patient contact the pharmacy for the refill. If patient does not wish to contact the pharmacy document the reason why and proceed with request.) (Agent: If yes, when and what did the pharmacy advise?)  Preferred Pharmacy (with phone number or street name):  [id number] - [place], [address] Phone:  [phone]  Fax:  [phone]     Has the patient been seen for an appointment in the last year OR does the patient have an upcoming appointment? Yes.    Agent: Please be advised that RX refills may take up to 3 business days. We ask that you follow-up with your pharmacy.   Pt has appointment scheduled for 09/13/21

## 2021-08-22 ENCOUNTER — Other Ambulatory Visit: Payer: Self-pay | Admitting: Family Medicine

## 2021-08-22 DIAGNOSIS — F330 Major depressive disorder, recurrent, mild: Secondary | ICD-10-CM

## 2021-08-24 NOTE — Telephone Encounter (Signed)
Requested medication (s) are due for refill today:   No  Requested medication (s) are on the active medication list:   Yes  Future visit scheduled:   Yes   Last ordered: 08/22/2021 #60, 0 refills  Returned because pharmacy requesting a DX Code   Requested Prescriptions  Pending Prescriptions Disp Refills   DULoxetine (CYMBALTA) 20 MG capsule 60 capsule 2    Sig: Take 2 capsules (40 mg total) by mouth daily.     Psychiatry: Antidepressants - SNRI Failed - 08/21/2021  8:30 PM      Failed - Valid encounter within last 6 months    Recent Outpatient Visits           6 months ago Hypothyroidism due to acquired atrophy of thyroid   [facility] Delsa Grana, PA-C   11 months ago Hypothyroidism due to acquired atrophy of thyroid   [facility] Delsa Grana, PA-C   1 year ago Essential hypertension   [facility] Delsa Grana, PA-C   1 year ago Moderate episode of recurrent major depressive disorder [facility])   [facility] Delsa Grana, PA-C   1 year ago Bloody sputum   [facility] Steele Sizer, MD       Future Appointments             In 2 weeks Steele Sizer, MD [facility], [place]   In 1 month Vanga, Tally Due, MD [place] - Completed PHQ-2 or PHQ-9 in the last 360 days      Passed - Last BP in normal range    BP Readings from Last 1 Encounters:  02/08/21 128/82

## 2021-09-13 ENCOUNTER — Ambulatory Visit: Payer: 59 | Admitting: Nurse Practitioner

## 2021-09-13 ENCOUNTER — Encounter: Payer: Self-pay | Admitting: Nurse Practitioner

## 2021-09-13 ENCOUNTER — Other Ambulatory Visit: Payer: Self-pay

## 2021-09-13 VITALS — BP 124/70 | HR 100 | Temp 98.3°F | Resp 16 | Ht 65.0 in | Wt 234.0 lb

## 2021-09-13 DIAGNOSIS — G43009 Migraine without aura, not intractable, without status migrainosus: Secondary | ICD-10-CM

## 2021-09-13 DIAGNOSIS — B0229 Other postherpetic nervous system involvement: Secondary | ICD-10-CM

## 2021-09-13 DIAGNOSIS — I10 Essential (primary) hypertension: Secondary | ICD-10-CM

## 2021-09-13 DIAGNOSIS — Z1231 Encounter for screening mammogram for malignant neoplasm of breast: Secondary | ICD-10-CM

## 2021-09-13 DIAGNOSIS — R7303 Prediabetes: Secondary | ICD-10-CM

## 2021-09-13 DIAGNOSIS — U099 Post covid-19 condition, unspecified: Secondary | ICD-10-CM

## 2021-09-13 DIAGNOSIS — E034 Atrophy of thyroid (acquired): Secondary | ICD-10-CM

## 2021-09-13 DIAGNOSIS — Z5181 Encounter for therapeutic drug level monitoring: Secondary | ICD-10-CM

## 2021-09-13 DIAGNOSIS — R197 Diarrhea, unspecified: Secondary | ICD-10-CM

## 2021-09-13 DIAGNOSIS — R300 Dysuria: Secondary | ICD-10-CM | POA: Diagnosis not present

## 2021-09-13 DIAGNOSIS — M722 Plantar fascial fibromatosis: Secondary | ICD-10-CM

## 2021-09-13 DIAGNOSIS — R0609 Other forms of dyspnea: Secondary | ICD-10-CM

## 2021-09-13 DIAGNOSIS — F330 Major depressive disorder, recurrent, mild: Secondary | ICD-10-CM

## 2021-09-13 DIAGNOSIS — E782 Mixed hyperlipidemia: Secondary | ICD-10-CM

## 2021-09-13 DIAGNOSIS — Z6839 Body mass index (BMI) 39.0-39.9, adult: Secondary | ICD-10-CM

## 2021-09-13 LAB — POCT URINALYSIS DIPSTICK
Bilirubin, UA: NEGATIVE
Glucose, UA: NEGATIVE
Ketones, UA: NEGATIVE
Leukocytes, UA: NEGATIVE
Nitrite, UA: NEGATIVE
Protein, UA: POSITIVE — AB
Spec Grav, UA: 1.02 (ref 1.010–1.025)
Urobilinogen, UA: 0.2 E.U./dL
pH, UA: 5 (ref 5.0–8.0)

## 2021-09-13 MED ORDER — SUMATRIPTAN SUCCINATE 100 MG PO TABS: ORAL_TABLET | ORAL | 1 refills | Status: DC

## 2021-09-13 MED ORDER — DULOXETINE HCL 20 MG PO CPEP: 40.0000 mg | ORAL_CAPSULE | Freq: Every day | ORAL | 3 refills | Status: DC

## 2021-09-13 MED ORDER — LEVOTHYROXINE SODIUM 112 MCG PO TABS: 112.0000 ug | ORAL_TABLET | Freq: Every day | ORAL | 3 refills | Status: DC

## 2021-09-13 NOTE — Progress Notes (Signed)
BP 124/70    Pulse 100    Temp 98.3 F (36.8 C) (Oral)    Resp 16    Ht 5\' 5"  (1.651 m)    Wt 234 lb (106.1 kg)    LMP 07/12/2016    SpO2 97%    BMI 38.94 kg/m    Subjective:    Patient ID: Nicole Osborn, female    DOB: 1971-08-12, 50 y.o.   MRN: [id number]  HPI: Nicole Osborn is a 50 y.o. female, here alone  Chief Complaint  Patient presents with   Medication Refill    Refill Duloxetine   Consult   HTN: She stopped taking her hydrochlorothiazide about a month ago.  She says she has been checking her blood pressures at home and they have been 120s-30s/70s.  Today her blood pressure is 124/70.  She says she stopped taking her medicine because she said it made her cough.  Will continue to monitor blood pressure.   Migraines/posthepatic neuralgia right side of face: She is  seeing Dr Manuella Ghazi for this. Last visit was 03/22/21. She is currently takes Imitrex for migraines.   Depression/ anxiety: She says she started going back to therapy last month.  She says it is helping.  She  says she has been on Cymbalta for a long time.  She says it is working okay for her. She is not interested in changing her treatment.  She denies any suicidal thoughts.  Her PHQ9 score is 2 today.  Her GAD score is 0 today.  Will continue current treatment.  Depression screen [facility] 2/9 09/13/2021 02/08/2021 09/05/2020 12/15/2019 11/12/2019  Decreased Interest 1 2 2 2 2   Down, Depressed, Hopeless 1 1 1  - 2  PHQ - 2 Score 2 3 3 2 4   Altered sleeping 0 3 1 3 3   Tired, decreased energy 0 3 1 2 3   Change in appetite 0 3 0 2 3  Feeling bad or failure about yourself  0 2 1 2 3   Trouble concentrating 0 2 1 3 3   Moving slowly or fidgety/restless 0 1 0 2 0  Suicidal thoughts 0 0 0 0 0  PHQ-9 Score 2 17 7 16 19   Difficult doing work/chores Not difficult at all Very difficult Somewhat difficult Somewhat difficult Very difficult  Some recent data might be hidden    GAD 7 : Generalized Anxiety Score 09/13/2021 02/08/2021  09/05/2020 12/15/2019  Nervous, Anxious, on Edge 0 2 1 3   Control/stop worrying 0 1 0 1  Worry too much - different things 0 2 0 3  Trouble relaxing 0 3 1 2   Restless 0 1 0 3  Easily annoyed or irritable 0 2 1 1   Afraid - awful might happen 0 1 0 2  Total GAD 7 Score 0 12 3 15   Anxiety Difficulty Not difficult at all Somewhat difficult Somewhat difficult Somewhat difficult     Hyperlipidemia: Her last LDL was 148 on 09/05/20.  She is not currently on any treatment.  Will get labs today. The 10-year ASCVD risk score (Arnett DK, et al., 2019) is: 2.4%   Values used to calculate the score:     Age: 42 years     Sex: Female     Is Non-Hispanic African American: No     Diabetic: No     Tobacco smoker: No     Systolic Blood Pressure: 235 mmHg     Is BP treated: No     HDL Cholesterol:  40 mg/dL     Total Cholesterol: 235 mg/dL   Hypothyroid: Last TSH was 1.25 on 02/08/21.  She is currently taking levothyroxine 112 mcg daily.  She denies any palpitations, weight changes, skin changes or fatigue.  Will get labs today.   Diarrhea: She says she has had diarrhea for three years.  She is currently being cared for by Dr Marius Ditch [facility]. Last visit was 11/30/20.  She is going to see GI again the first of the year.  Obesity:  She is not currently doing anything for weight loss. Her current weight is 234 lbs with a BMI of 38.94. Discussed recommendations of physical activity for at least 150 min a week.  She says she tries to watch what she eats.   Plantar fascitis: she says she has had plantar fascitis in her right foot for a while. She says she is doing stretches and that helps.  She says she has seen [facility] for this problem.    Covid long haulers:  She says that since she had covid in the beginning of the year.  She says she feels like she has never really recovered.  She says that even before she had covid she would get multiple episodes of bronchitis.  She says that she feels like Covid really  damaged her lungs.  She says that she would like to get a pulmonary function test to check her lungs.  She says she just doesn't breath as easy as she did before.  She denies smoking. She denies chest pain or acute shortness of breath.   Dysuria/ frequency: She says she has noticed that she has had urinary frequency and dysuria off and on for four years.  She has seen Dr. Marcelline Mates for this in the past.  She says she also has some incontinence and has seen a pelvic floor specialist.  She says she does not want to do that again.  She says that her dysuria and frequency seemed worse this week.  Will check urine for infection.     Relevant past medical, surgical, family and social history reviewed and updated as indicated. Interim medical history since our last visit reviewed. Allergies and medications reviewed and updated.  Review of Systems  Constitutional: Negative for fever or weight change.  Respiratory: Positive for cough  and shortness of breath since the beginning of the year  Cardiovascular: Negative for chest pain or palpitations.  Gastrointestinal: Negative for abdominal pain, positive for diarrhea Musculoskeletal: Negative for gait problem or joint swelling.  Skin: Negative for rash.  Neurological: Negative for dizziness or headache.  No other specific complaints in a complete review of systems (except as listed in HPI above).      Objective:    BP 124/70    Pulse 100    Temp 98.3 F (36.8 C) (Oral)    Resp 16    Ht 5\' 5"  (1.651 m)    Wt 234 lb (106.1 kg)    LMP 07/12/2016    SpO2 97%    BMI 38.94 kg/m   Wt Readings from Last 3 Encounters:  09/13/21 234 lb (106.1 kg)  02/08/21 234 lb (106.1 kg)  09/05/20 229 lb 4.8 oz (104 kg)    Physical Exam  Constitutional: Patient appears well-developed and well-nourished. Obese No distress.  HEENT: head atraumatic, normocephalic, pupils equal and reactive to light, neck supple Cardiovascular: Normal rate, regular rhythm and normal heart  sounds.  No murmur heard. No BLE edema. Pulmonary/Chest: Effort normal and  breath sounds normal. No respiratory distress. Abdominal: Soft.  There is no tenderness. Psychiatric: Patient has a normal mood and affect. behavior is normal. Judgment and thought content normal.   Results for orders placed or performed in visit on 09/13/21  POCT urinalysis dipstick  Result Value Ref Range   Color, UA yellow    Clarity, UA clear    Glucose, UA Negative Negative   Bilirubin, UA negative    Ketones, UA negative    Spec Grav, UA 1.020 1.010 - 1.025   Blood, UA trace    pH, UA 5.0 5.0 - 8.0   Protein, UA Positive (A) Negative   Urobilinogen, UA 0.2 0.2 or 1.0 E.U./dL   Nitrite, UA negative    Leukocytes, UA Negative Negative   Appearance clear    Odor none       Assessment & Plan:   1. Essential hypertension -continue to monitor blood pressure - COMPLETE METABOLIC PANEL WITH GFR - CBC with Differential/Platelet  2. Migraine without aura and without status migrainosus, not intractable -continue follow-ups with neuro - SUMAtriptan (IMITREX) 100 MG tablet; May repeat in 2 hours if headache persists or recurs.  Dispense: 12 tablet; Refill: 1  3. Postherpetic neuralgia -continue follow-ups with neuro  4. Mild episode of recurrent major depressive disorder ([place]) -continue with therapy - DULoxetine (CYMBALTA) 20 MG capsule; Take 2 capsules (40 mg total) by mouth daily.  Dispense: 60 capsule; Refill: 3  5. Mixed hyperlipidemia  - Lipid panel  6. Hypothyroidism due to acquired atrophy of thyroid  - TSH - levothyroxine (SYNTHROID) 112 MCG tablet; Take 1 tablet (112 mcg total) by mouth daily before breakfast.  Dispense: 90 tablet; Refill: 3  7. Diarrhea, unspecified type -continue follow-up with GI  8. Class 2 severe obesity due to excess calories with serious comorbidity and body mass index (BMI) of 39.0 to 39.9 in adult [facility]) -increase physical activity -work on portion  control  9. Plantar fasciitis of right foot -continue current treatment  10. Prediabetes  - Hemoglobin A1c  11. COVID-19 long hauler manifesting chronic dyspnea  - Ambulatory referral to Pulmonology  12. Dysuria  - POCT urinalysis dipstick  13. Encounter for medication monitoring  - COMPLETE METABOLIC PANEL WITH GFR - TSH - Lipid panel - CBC with Differential/Platelet - Hemoglobin A1c  14. Screening mammogram for breast cancer  - MM 3D SCREEN BREAST BILATERAL; Future   Follow up plan: Return in about 6 months (around 03/14/2022) for follow up.

## 2021-09-14 ENCOUNTER — Encounter: Payer: Self-pay | Admitting: Nurse Practitioner

## 2021-09-14 LAB — CBC WITH DIFFERENTIAL/PLATELET
Absolute Monocytes: 539 cells/uL (ref 200–950)
Basophils Absolute: 112 cells/uL (ref 0–200)
Basophils Relative: 1.8 %
Eosinophils Absolute: 198 cells/uL (ref 15–500)
Eosinophils Relative: 3.2 %
HCT: 42.2 % (ref 35.0–45.0)
Hemoglobin: 13.9 g/dL (ref 11.7–15.5)
Lymphs Abs: 2189 cells/uL (ref 850–3900)
MCH: 29.6 pg (ref 27.0–33.0)
MCHC: 32.9 g/dL (ref 32.0–36.0)
MCV: 90 fL (ref 80.0–100.0)
MPV: 10.9 fL (ref 7.5–12.5)
Monocytes Relative: 8.7 %
Neutro Abs: 3162 cells/uL (ref 1500–7800)
Neutrophils Relative %: 51 %
Platelets: 258 10*3/uL (ref 140–400)
RBC: 4.69 10*6/uL (ref 3.80–5.10)
RDW: 12.9 % (ref 11.0–15.0)
Total Lymphocyte: 35.3 %
WBC: 6.2 10*3/uL (ref 3.8–10.8)

## 2021-09-14 LAB — COMPLETE METABOLIC PANEL WITH GFR
AG Ratio: 1.6 (calc) (ref 1.0–2.5)
ALT: 14 U/L (ref 6–29)
AST: 16 U/L (ref 10–35)
Albumin: 4.4 g/dL (ref 3.6–5.1)
Alkaline phosphatase (APISO): 80 U/L (ref 37–153)
BUN: 12 mg/dL (ref 7–25)
CO2: 30 mmol/L (ref 20–32)
Calcium: 9.9 mg/dL (ref 8.6–10.4)
Chloride: 106 mmol/L (ref 98–110)
Creat: 0.72 mg/dL (ref 0.50–1.03)
Globulin: 2.7 g/dL (calc) (ref 1.9–3.7)
Glucose, Bld: 89 mg/dL (ref 65–99)
Potassium: 4.2 mmol/L (ref 3.5–5.3)
Sodium: 143 mmol/L (ref 135–146)
Total Bilirubin: 0.5 mg/dL (ref 0.2–1.2)
Total Protein: 7.1 g/dL (ref 6.1–8.1)
eGFR: 101 mL/min/{1.73_m2} (ref >=60)

## 2021-09-14 LAB — LIPID PANEL
Cholesterol: 218 mg/dL — ABNORMAL HIGH (ref <200)
HDL: 40 mg/dL — ABNORMAL LOW (ref >=50)
LDL Cholesterol (Calc): 146 mg/dL (calc) — ABNORMAL HIGH
Non-HDL Cholesterol (Calc): 178 mg/dL (calc) — ABNORMAL HIGH (ref <130)
Total CHOL/HDL Ratio: 5.5 (calc) — ABNORMAL HIGH (ref <5.0)
Triglycerides: 184 mg/dL — ABNORMAL HIGH (ref <150)

## 2021-09-14 LAB — HEMOGLOBIN A1C
Hgb A1c MFr Bld: 6.1 % of total Hgb — ABNORMAL HIGH (ref <5.7)
Mean Plasma Glucose: 128 mg/dL
eAG (mmol/L): 7.1 mmol/L

## 2021-09-14 LAB — TSH: TSH: 0.93 mIU/L

## 2021-10-04 ENCOUNTER — Encounter: Payer: Self-pay | Admitting: Gastroenterology

## 2021-10-04 ENCOUNTER — Ambulatory Visit (INDEPENDENT_AMBULATORY_CARE_PROVIDER_SITE_OTHER): Payer: Self-pay | Admitting: Gastroenterology

## 2021-10-04 VITALS — BP 138/65 | HR 80 | Temp 98.5°F | Ht 65.0 in | Wt 233.2 lb

## 2021-10-04 DIAGNOSIS — K580 Irritable bowel syndrome with diarrhea: Secondary | ICD-10-CM

## 2021-10-04 NOTE — Progress Notes (Signed)
Cephas Darby, MD [address]  [place]  [place], [place] [address]  Main: [phone]  Fax: [phone]    Gastroenterology Consultation  Referring Provider:     Delsa Grana, PA-C Primary Care Physician:  Delsa Grana, PA-C Primary Gastroenterologist:  Dr. Cephas Darby Reason for Consultation: Chronic diarrhea        HPI:   Nicole Osborn is a 51 y.o. female referred by Dr. Delsa Grana, PA-C  for consultation & management of 2 years h/o perianal itching, burning, hard to clean, occasional blood on wiping (every 39months or so), protrusion of tissue and has to push back in.  Denies constipation, or diarrhea.  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  She continues to have protrusion of the tissue per rectum after hemorrhoid ligation x2 in 2019.  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  She reports that she has been going through a lot of stress within last 1 year between school and also she was trying to get weight loss surgery performed but it did not happen.  She thinks, a lot of her GI symptoms has to do with her dietary habits.  Follow-up visit 10/04/2021 Patient is here for follow-up to see me after 2 years regarding her chronic symptoms of diarrhea, abdominal cramps and bloating.  However, her weight has been stable.  Patient reports that she stopped working as a Pharmacist, hospital due to diarrheal episodes and she cannot work in a closed to due to her ongoing GI symptoms.  She is also not sure if she wants to continue to work as a Pharmacist, hospital.  She has not been closely seeing her psychiatrist and her therapist.  She is trying  to apply for disability.  She is going through financial stress currently.  Patient underwent colonoscopy with biopsies which were unremarkable.  She had celiac panel negative, GI profile PCR and H. pylori breath test negative.  She does not smoke or drink alcohol NSAIDs: None  Antiplts/Anticoagulants/Anti thrombotics: None  GI Procedures:  Colonoscopy 12/11/2019 - Perianal skin tags found on perianal exam. - The examined portion of the ileum was normal. - Normal mucosa in the entire examined colon. Biopsied. - The distal rectum and anal verge are normal on retroflexion view. DIAGNOSIS:  A.  COLON; RANDOM COLD BIOPSY:  - COLONIC MUCOSA WITH INTACT CRYPT ARCHITECTURE AND LYMPHOID  AGGREGATES/FOLLICLES.  - NEGATIVE FOR ACTIVE INFLAMMATION, MICROSCOPIC COLITIS, AND DYSPLASIA.   She denies any GI surgeries She denies family history of GI malignancy She denies smoking or alcohol use  Past Medical History:  Diagnosis Date   Anxiety    Arthritis    Cholecystitis with cholelithiasis 09/19/2017   Chronic venous insufficiency of lower extremity    Depression    Elevated blood pressure reading    no meds   GERD (gastroesophageal reflux disease)    occ   Heartburn    History of kidney stones    h/o   Hyperlipidemia    Hypothyroidism    Migraine 09/23/2017   Migraines    without aura   Obesity    Thyroid disease    Varicose veins of  both lower extremities 09/06/2017    Past Surgical History:  Procedure Laterality Date   CHOLECYSTECTOMY N/A 10/08/2017   Procedure: LAPAROSCOPIC CHOLECYSTECTOMY;  Surgeon: Florene Glen, MD;  Location: [facility] ORS;  Service: General;  Laterality: N/A;   COLONOSCOPY WITH PROPOFOL N/A 12/11/2019   Procedure: COLONOSCOPY WITH PROPOFOL;  Surgeon: Lin Landsman, MD;  Location: [facility] ENDOSCOPY;  Service: Gastroenterology;  Laterality: N/A;   Removal of extra tooth     age 1    Current Outpatient Medications:    Cholecalciferol (VITAMIN D3) 2000  units capsule, Take 2,000 Units by mouth daily. , Disp: , Rfl:    DULoxetine (CYMBALTA) 20 MG capsule, Take 2 capsules (40 mg total) by mouth daily., Disp: 60 capsule, Rfl: 3   levothyroxine (SYNTHROID) 112 MCG tablet, Take 1 tablet (112 mcg total) by mouth daily before breakfast., Disp: 90 tablet, Rfl: 3   meloxicam (MOBIC) 7.5 MG tablet, Take 7.5 mg by mouth daily., Disp: , Rfl:    Omega-3 Fatty Acids (FISH OIL) 1000 MG CAPS, Take by mouth., Disp: , Rfl:    OREGANO PO, Take 150 capsules by mouth daily as needed (immune support)., Disp: , Rfl:    SUMAtriptan (IMITREX) 100 MG tablet, May repeat in 2 hours if headache persists or recurs., Disp: 12 tablet, Rfl: 1   tiZANidine (ZANAFLEX) 4 MG capsule, tizanidine 4 mg capsule  TAKE 1 CAPSULE BY MOUTH EVERY DAY AT BEDTIME AS NEEDED, Disp: , Rfl:    Turmeric 500 MG CAPS, Take 500 mg by mouth daily., Disp: , Rfl:    ELDERBERRY PO, Take by mouth. (Patient not taking: Reported on 10/04/2021), Disp: , Rfl:    Magnesium 250 MG TABS, Take 250 mg by mouth at bedtime. (Patient not taking: Reported on 10/04/2021), Disp: , Rfl:    Multiple Vitamin (MULTI-VITAMINS) TABS, Take 1 tablet by mouth daily.  (Patient not taking: Reported on 10/04/2021), Disp: , Rfl:     Family History  Problem Relation Age of Onset   Depression Mother    Arthritis Mother    Stroke Father    Hypertension Father    Breast cancer Paternal Aunt 21   Emphysema Maternal Grandmother    Emphysema Maternal Grandfather    Dementia Paternal Grandmother    Asthma Son    Eczema Son    Kidney disease Neg Hx    Bladder Cancer Neg Hx      Social History   Tobacco Use   Smoking status: Never   Smokeless tobacco: Never  Vaping Use   Vaping Use: Never used  Substance Use Topics   Alcohol use: No   Drug use: No    Allergies as of 10/04/2021 - Review Complete 10/04/2021  Allergen Reaction Noted   Wellbutrin [bupropion] Other (See Comments) 09/05/2020   Cefaclor Swelling 12/15/2015     Review of Systems:    All systems reviewed and negative except where noted in HPI.   Physical Exam:  BP 138/65 (BP Location: Left Arm, Patient Position: Sitting, Cuff Size: Large)    Pulse 80    Temp 98.5 F (36.9 C) (Oral)    Ht 5\' 5"  (1.651 m)    Wt 233 lb 4 oz (105.8 kg)    LMP 07/12/2016    BMI 38.81 kg/m  Patient's last menstrual period was 07/12/2016.  General:   Alert,  Well-developed, well-nourished, pleasant and cooperative in NAD Head:  Normocephalic and atraumatic. Eyes:  Sclera clear, no icterus.  Conjunctiva pink. Ears:  Normal  auditory acuity. Nose:  No deformity, discharge, or lesions. Mouth:  No deformity or lesions,oropharynx pink & moist. Neck:  Supple; no masses or thyromegaly. Lungs:  Respirations even and unlabored.  Clear throughout to auscultation.   No wheezes, crackles, or rhonchi. No acute distress. Heart:  Regular rate and rhythm; no murmurs, clicks, rubs, or gallops. Abdomen:  Normal bowel sounds. Soft, morbidly obese, non-tender and non-distended without palpable masses or hernias noted.  No guarding or rebound tenderness.   Rectal: Perianal skin tag Msk:  Symmetrical without gross deformities. Good, equal movement & strength bilaterally. Pulses:  Normal pulses noted. Extremities:  No clubbing or edema.  No cyanosis. Neurologic:  Alert and oriented x3;  grossly normal neurologically. Skin: No rash, no jaundice. Psych:  Alert and cooperative. Normal mood and affect.  Imaging Studies: Reviewed  Assessment and Plan:   Nicole Osborn is a 51 y.o. 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 pylori breath test, GI profile PCR, celiac negative Recommend pancreatic fecal elastase levels, food allergy profile, alpha gal panel Advised patient regarding trial of amitriptyline.  She is not interested as she is already on Cymbalta Provided her with samples of pancreatic enzymes today   Follow up in 3 to 4 months   Cephas Darby, MD

## 2021-10-04 NOTE — Patient Instructions (Addendum)
Gave Creon samples take 2 capsule with first bite of each meal and 1 capsule with first bite of each snack  Heart-Healthy Eating Plan Many factors influence your heart (coronary) health, including eating and exercise habits. Coronary risk increases with abnormal blood fat (lipid) levels. Heart-healthy meal planning includes limiting unhealthy fats, increasing healthy fats, and making other diet and lifestyle changes. What is my plan? Your health care provider may recommend that you: Limit your fat intake to _________% or less of your total calories each day. Limit your saturated fat intake to _________% or less of your total calories each day. Limit the amount of cholesterol in your diet to less than _________ mg per day. What are tips for following this plan? Cooking Cook foods using methods other than frying. Baking, boiling, grilling, and broiling are all good options. Other ways to reduce fat include: Removing the skin from poultry. Removing all visible fats from meats. Steaming vegetables in water or broth. Meal planning  At meals, imagine dividing your plate into fourths: Fill one-half of your plate with vegetables and green salads. Fill one-fourth of your plate with whole grains. Fill one-fourth of your plate with lean protein foods. Eat 4-5 servings of vegetables per day. One serving equals 1 cup raw or cooked vegetable, or 2 cups raw leafy greens. Eat 4-5 servings of fruit per day. One serving equals 1 medium whole fruit,  cup dried fruit,  cup fresh, frozen, or canned fruit, or  cup 100% fruit juice. Eat more foods that contain soluble fiber. Examples include apples, broccoli, carrots, beans, peas, and barley. Aim to get 25-30 g of fiber per day. Increase your consumption of legumes, nuts, and seeds to 4-5 servings per week. One serving of dried beans or legumes equals  cup cooked, 1 serving of nuts is  cup, and 1 serving of seeds equals 1 tablespoon. Fats Choose healthy fats  more often. Choose monounsaturated and polyunsaturated fats, such as olive and canola oils, flaxseeds, walnuts, almonds, and seeds. Eat more omega-3 fats. Choose salmon, mackerel, sardines, tuna, flaxseed oil, and ground flaxseeds. Aim to eat fish at least 2 times each week. Check food labels carefully to identify foods with trans fats or high amounts of saturated fat. Limit saturated fats. These are found in animal products, such as meats, butter, and cream. Plant sources of saturated fats include palm oil, palm kernel oil, and coconut oil. Avoid foods with partially hydrogenated oils in them. These contain trans fats. Examples are stick margarine, some tub margarines, cookies, crackers, and other baked goods. Avoid fried foods. General information Eat more home-cooked food and less restaurant, buffet, and fast food. Limit or avoid alcohol. Limit foods that are high in starch and sugar. Lose weight if you are overweight. Losing just 5-10% of your body weight can help your overall health and prevent diseases such as diabetes and heart disease. Monitor your salt (sodium) intake, especially if you have high blood pressure. Talk with your health care provider about your sodium intake. Try to incorporate more vegetarian meals weekly. What foods can I eat? Fruits All fresh, canned (in natural juice), or frozen fruits. Vegetables Fresh or frozen vegetables (raw, steamed, roasted, or grilled). Green salads. Grains Most grains. Choose whole wheat and whole grains most of the time. Rice and pasta, including brown rice and pastas made with whole wheat. Meats and other proteins Lean, well-trimmed beef, veal, pork, and lamb. Chicken and [place] without skin. All fish and shellfish. Wild duck, rabbit, pheasant, and  venison. Egg whites or low-cholesterol egg substitutes. Dried beans, peas, lentils, and tofu. Seeds and most nuts. Dairy Low-fat or nonfat cheeses, including ricotta and mozzarella. Skim or 1%  milk (liquid, powdered, or evaporated). Buttermilk made with low-fat milk. Nonfat or low-fat yogurt. Fats and oils Non-hydrogenated (trans-free) margarines. Vegetable oils, including soybean, sesame, sunflower, olive, peanut, safflower, corn, canola, and cottonseed. Salad dressings or mayonnaise made with a vegetable oil. Beverages Water (mineral or sparkling). Coffee and tea. Diet carbonated beverages. Sweets and desserts Sherbet, gelatin, and fruit ice. Small amounts of dark chocolate. Limit all sweets and desserts. Seasonings and condiments All seasonings and condiments. The items listed above may not be a complete list of foods and beverages you can eat. Contact a dietitian for more options. What foods are not recommended? Fruits Canned fruit in heavy syrup. Fruit in cream or butter sauce. Fried fruit. Limit coconut. Vegetables Vegetables cooked in cheese, cream, or butter sauce. Fried vegetables. Grains Breads made with saturated or trans fats, oils, or whole milk. Croissants. Sweet rolls. Donuts. High-fat crackers, such as cheese crackers. Meats and other proteins Fatty meats, such as hot dogs, ribs, sausage, bacon, rib-eye roast or steak. High-fat deli meats, such as salami and bologna. Caviar. Domestic duck and goose. Organ meats, such as liver. Dairy Cream, sour cream, cream cheese, and creamed cottage cheese. Whole-milk cheeses. Whole or 2% milk (liquid, evaporated, or condensed). Whole buttermilk. Cream sauce or high-fat cheese sauce. Whole-milk yogurt. Fats and oils Meat fat, or shortening. Cocoa butter, hydrogenated oils, palm oil, coconut oil, palm kernel oil. Solid fats and shortenings, including bacon fat, salt pork, lard, and butter. Nondairy cream substitutes. Salad dressings with cheese or sour cream. Beverages Regular sodas and any drinks with added sugar. Sweets and desserts Frosting. Pudding. Cookies. Cakes. Pies. Milk chocolate or white chocolate. Buttered syrups.  Full-fat ice cream or ice cream drinks. The items listed above may not be a complete list of foods and beverages to avoid. Contact a dietitian for more information. Summary Heart-healthy meal planning includes limiting unhealthy fats, increasing healthy fats, and making other diet and lifestyle changes. Lose weight if you are overweight. Losing just 5-10% of your body weight can help your overall health and prevent diseases such as diabetes and heart disease. Focus on eating a balance of foods, including fruits and vegetables, low-fat or nonfat dairy, lean protein, nuts and legumes, whole grains, and heart-healthy oils and fats. This information is not intended to replace advice given to you by your health care provider. Make sure you discuss any questions you have with your health care provider. Document Revised: 01/19/2021 Document Reviewed: 01/19/2021 Elsevier Patient Education  2022 [employer].

## 2021-10-05 ENCOUNTER — Encounter: Payer: Self-pay | Admitting: Gastroenterology

## 2021-10-07 LAB — ALPHA-GAL PANEL
Allergen Lamb IgE: <0.1 kU/L
Beef IgE: <0.1 kU/L
IgE (Immunoglobulin E), Serum: 74 IU/mL (ref 6–495)
O215-IgE Alpha-Gal: <0.1 kU/L
Pork IgE: <0.1 kU/L

## 2021-10-07 LAB — FOOD ALLERGY PROFILE
Allergen Corn, IgE: <0.1 kU/L
Clam IgE: <0.1 kU/L
Codfish IgE: <0.1 kU/L
Egg White IgE: 0.84 kU/L — AB
Milk IgE: 0.35 kU/L — AB
Peanut IgE: 0.1 kU/L — AB
Scallop IgE: <0.1 kU/L
Sesame Seed IgE: <0.1 kU/L
Shrimp IgE: <0.1 kU/L
Soybean IgE: <0.1 kU/L
Walnut IgE: <0.1 kU/L
Wheat IgE: 0.48 kU/L — AB

## 2021-10-08 ENCOUNTER — Encounter: Payer: Self-pay | Admitting: Gastroenterology

## 2021-10-08 DIAGNOSIS — K529 Noninfective gastroenteritis and colitis, unspecified: Secondary | ICD-10-CM

## 2021-10-09 NOTE — Telephone Encounter (Signed)
Place referral to [facility] and faxed referral to [phone]

## 2021-10-09 NOTE — Telephone Encounter (Signed)
What is the referral for? 

## 2021-10-13 LAB — PANCREATIC ELASTASE, FECAL: Pancreatic Elastase, Fecal: 300 ug Elast./g (ref >200)

## 2021-10-16 ENCOUNTER — Encounter: Payer: Self-pay | Admitting: Gastroenterology

## 2021-10-17 ENCOUNTER — Encounter: Payer: Self-pay | Admitting: Nurse Practitioner

## 2021-10-19 ENCOUNTER — Institutional Professional Consult (permissible substitution): Payer: Self-pay | Admitting: Internal Medicine

## 2021-10-19 NOTE — Progress Notes (Deleted)
° °  Nicole Osborn, female    DOB: 1971/05/24,    MRN: [id number]   Brief patient profile:  ***  yo***  *** referred to pulmonary clinic in [facility]  10/19/2021 by Dr Marland Kitchen  for ***          History of Present Illness  10/19/2021  Pulmonary/ 1st office eval/ Jayvin Hurrell / [employer]  No chief complaint on file.    Dyspnea:  *** Cough: *** Sleep: *** SABA use:   Past Medical History:  Diagnosis Date   Anxiety    Arthritis    Cholecystitis with cholelithiasis 09/19/2017   Chronic venous insufficiency of lower extremity    Depression    Elevated blood pressure reading    no meds   GERD (gastroesophageal reflux disease)    occ   Heartburn    History of kidney stones    h/o   Hyperlipidemia    Hypothyroidism    Migraine 09/23/2017   Migraines    without aura   Obesity    Thyroid disease    Varicose veins of both lower extremities 09/06/2017    Outpatient Medications Prior to Visit  Medication Sig Dispense Refill   Cholecalciferol (VITAMIN D3) 2000 units capsule Take 2,000 Units by mouth daily.      DULoxetine (CYMBALTA) 20 MG capsule Take 2 capsules (40 mg total) by mouth daily. 60 capsule 3   ELDERBERRY PO Take by mouth. (Patient not taking: Reported on 10/04/2021)     levothyroxine (SYNTHROID) 112 MCG tablet Take 1 tablet (112 mcg total) by mouth daily before breakfast. 90 tablet 3   Magnesium 250 MG TABS Take 250 mg by mouth at bedtime. (Patient not taking: Reported on 10/04/2021)     meloxicam (MOBIC) 7.5 MG tablet Take 7.5 mg by mouth daily.     Multiple Vitamin (MULTI-VITAMINS) TABS Take 1 tablet by mouth daily.  (Patient not taking: Reported on 10/04/2021)     Omega-3 Fatty Acids (FISH OIL) 1000 MG CAPS Take by mouth.     OREGANO PO Take 150 capsules by mouth daily as needed (immune support).     SUMAtriptan (IMITREX) 100 MG tablet May repeat in 2 hours if headache persists or recurs. 12 tablet 1   tiZANidine (ZANAFLEX) 4 MG capsule tizanidine 4 mg capsule  TAKE 1  CAPSULE BY MOUTH EVERY DAY AT BEDTIME AS NEEDED     Turmeric 500 MG CAPS Take 500 mg by mouth daily.     No facility-administered medications prior to visit.     Objective:     LMP 07/12/2016          Assessment   No problem-specific Assessment & Plan notes found for this encounter.     Christinia Gully, MD 10/19/2021

## 2021-10-27 ENCOUNTER — Other Ambulatory Visit: Payer: Self-pay | Admitting: Gastroenterology

## 2021-10-27 ENCOUNTER — Encounter: Payer: Self-pay | Admitting: Nurse Practitioner

## 2021-10-27 DIAGNOSIS — K219 Gastro-esophageal reflux disease without esophagitis: Secondary | ICD-10-CM

## 2021-10-27 MED ORDER — OMEPRAZOLE 40 MG PO CPDR: 40.0000 mg | DELAYED_RELEASE_CAPSULE | Freq: Two times a day (BID) | ORAL | 0 refills | Status: DC

## 2022-01-08 ENCOUNTER — Ambulatory Visit
Admission: RE | Admit: 2022-01-08 | Discharge: 2022-01-08 | Disposition: A | Discharge status: Home / Self Care | Payer: 59 | Source: Ambulatory Visit | Attending: Nurse Practitioner | Admitting: Nurse Practitioner

## 2022-01-08 DIAGNOSIS — Z1231 Encounter for screening mammogram for malignant neoplasm of breast: Secondary | ICD-10-CM | POA: Diagnosis present

## 2022-01-18 ENCOUNTER — Emergency Department (HOSPITAL_COMMUNITY)
Admission: EM | Admit: 2022-01-18 | Discharge: 2022-01-18 | Disposition: A | Discharge status: Home / Self Care | Payer: 59 | Attending: Emergency Medicine | Admitting: Emergency Medicine

## 2022-01-18 ENCOUNTER — Emergency Department (HOSPITAL_COMMUNITY): Payer: 59

## 2022-01-18 ENCOUNTER — Other Ambulatory Visit: Payer: Self-pay

## 2022-01-18 ENCOUNTER — Encounter (HOSPITAL_COMMUNITY): Payer: Self-pay

## 2022-01-18 DIAGNOSIS — R109 Unspecified abdominal pain: Secondary | ICD-10-CM | POA: Insufficient documentation

## 2022-01-18 DIAGNOSIS — Z91010 Allergy to peanuts: Secondary | ICD-10-CM | POA: Insufficient documentation

## 2022-01-18 DIAGNOSIS — S0033XA Contusion of nose, initial encounter: Secondary | ICD-10-CM | POA: Diagnosis not present

## 2022-01-18 DIAGNOSIS — Z23 Encounter for immunization: Secondary | ICD-10-CM | POA: Diagnosis not present

## 2022-01-18 DIAGNOSIS — S8001XA Contusion of right knee, initial encounter: Secondary | ICD-10-CM | POA: Insufficient documentation

## 2022-01-18 DIAGNOSIS — S30811A Abrasion of abdominal wall, initial encounter: Secondary | ICD-10-CM | POA: Diagnosis not present

## 2022-01-18 DIAGNOSIS — R519 Headache, unspecified: Secondary | ICD-10-CM | POA: Insufficient documentation

## 2022-01-18 DIAGNOSIS — Z79899 Other long term (current) drug therapy: Secondary | ICD-10-CM | POA: Diagnosis not present

## 2022-01-18 DIAGNOSIS — Y92410 Unspecified street and highway as the place of occurrence of the external cause: Secondary | ICD-10-CM | POA: Insufficient documentation

## 2022-01-18 DIAGNOSIS — S0990XA Unspecified injury of head, initial encounter: Secondary | ICD-10-CM | POA: Diagnosis present

## 2022-01-18 LAB — CBC
HCT: 39.2 % (ref 36.0–46.0)
Hemoglobin: 13.2 g/dL (ref 12.0–15.0)
MCH: 30.1 pg (ref 26.0–34.0)
MCHC: 33.7 g/dL (ref 30.0–36.0)
MCV: 89.5 fL (ref 80.0–100.0)
Platelets: 219 10*3/uL (ref 150–400)
RBC: 4.38 MIL/uL (ref 3.87–5.11)
RDW: 12.3 % (ref 11.5–15.5)
WBC: 9.2 10*3/uL (ref 4.0–10.5)
nRBC: 0 % (ref 0.0–0.2)

## 2022-01-18 LAB — COMPREHENSIVE METABOLIC PANEL
ALT: 16 U/L (ref 0–44)
AST: 21 U/L (ref 15–41)
Albumin: 3.6 g/dL (ref 3.5–5.0)
Alkaline Phosphatase: 72 U/L (ref 38–126)
Anion gap: 6 (ref 5–15)
BUN: 10 mg/dL (ref 6–20)
CO2: 26 mmol/L (ref 22–32)
Calcium: 8.9 mg/dL (ref 8.9–10.3)
Chloride: 109 mmol/L (ref 98–111)
Creatinine, Ser: 0.72 mg/dL (ref 0.44–1.00)
GFR, Estimated: >60 mL/min (ref >60)
Glucose, Bld: 105 mg/dL — ABNORMAL HIGH (ref 70–99)
Potassium: 3.8 mmol/L (ref 3.5–5.1)
Sodium: 141 mmol/L (ref 135–145)
Total Bilirubin: 0.4 mg/dL (ref 0.3–1.2)
Total Protein: 6.6 g/dL (ref 6.5–8.1)

## 2022-01-18 LAB — I-STAT CHEM 8, ED
BUN: 12 mg/dL (ref 6–20)
Calcium, Ion: 1.2 mmol/L (ref 1.15–1.40)
Chloride: 105 mmol/L (ref 98–111)
Creatinine, Ser: 0.7 mg/dL (ref 0.44–1.00)
Glucose, Bld: 103 mg/dL — ABNORMAL HIGH (ref 70–99)
HCT: 39 % (ref 36.0–46.0)
Hemoglobin: 13.3 g/dL (ref 12.0–15.0)
Potassium: 3.9 mmol/L (ref 3.5–5.1)
Sodium: 142 mmol/L (ref 135–145)
TCO2: 28 mmol/L (ref 22–32)

## 2022-01-18 LAB — LACTIC ACID, PLASMA: Lactic Acid, Venous: 1.9 mmol/L (ref 0.5–1.9)

## 2022-01-18 LAB — ETHANOL: Alcohol, Ethyl (B): <10 mg/dL (ref <10)

## 2022-01-18 LAB — I-STAT BETA HCG BLOOD, ED (MC, WL, AP ONLY): I-stat hCG, quantitative: <5 m[IU]/mL (ref <5)

## 2022-01-18 LAB — SAMPLE TO BLOOD BANK

## 2022-01-18 LAB — PROTIME-INR
INR: 1.1 (ref 0.8–1.2)
Prothrombin Time: 14.2 seconds (ref 11.4–15.2)

## 2022-01-18 MED ORDER — CYCLOBENZAPRINE HCL 10 MG PO TABS: 5.0000 mg | ORAL_TABLET | Freq: Two times a day (BID) | ORAL | 0 refills | Status: DC | PRN

## 2022-01-18 MED ORDER — FENTANYL CITRATE PF 50 MCG/ML IJ SOSY: 50.0000 ug | PREFILLED_SYRINGE | INTRAMUSCULAR | Status: DC | PRN

## 2022-01-18 MED ORDER — TETANUS-DIPHTH-ACELL PERTUSSIS 5-2.5-18.5 LF-MCG/0.5 IM SUSY
0.5000 mL | PREFILLED_SYRINGE | Freq: Once | INTRAMUSCULAR | Status: AC
  Administered 2022-01-18: 0.5 mL via INTRAMUSCULAR
  Filled 2022-01-18: qty 0.5

## 2022-01-18 MED ORDER — CELECOXIB 200 MG PO CAPS: 200.0000 mg | ORAL_CAPSULE | Freq: Two times a day (BID) | ORAL | 0 refills | Status: DC

## 2022-01-18 MED ORDER — IOHEXOL 300 MG/ML  SOLN
100.0000 mL | Freq: Once | INTRAMUSCULAR | Status: AC | PRN
  Administered 2022-01-18: 100 mL via INTRAVENOUS

## 2022-01-18 NOTE — Discharge Instructions (Signed)

## 2022-01-18 NOTE — ED Notes (Signed)
Per provider, ok to remove c-collar.  ?
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
## 2022-01-18 NOTE — Progress Notes (Signed)
Orthopedic Tech Progress Note ?Patient Details:  ?Nicole Osborn ?Jul 15, 1971 ?[id number] ? ?Ortho Devices ?Type of Ortho Device: Crutches, Knee Sleeve ?Ortho Device/Splint Location: RLE ?Ortho Device/Splint Interventions: Application, Ordered ?  ?Post Interventions ?Patient Tolerated: Well ? ?Nicole Osborn ?01/18/2022, 4:59 PM ? ?

## 2022-01-21 ENCOUNTER — Encounter: Payer: Self-pay | Admitting: Nurse Practitioner

## 2022-01-22 ENCOUNTER — Ambulatory Visit: Payer: Self-pay | Admitting: *Deleted

## 2022-01-22 ENCOUNTER — Other Ambulatory Visit: Payer: Self-pay | Admitting: Nurse Practitioner

## 2022-01-22 DIAGNOSIS — F330 Major depressive disorder, recurrent, mild: Secondary | ICD-10-CM
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
## 2022-01-23 ENCOUNTER — Ambulatory Visit (INDEPENDENT_AMBULATORY_CARE_PROVIDER_SITE_OTHER): Payer: 59 | Admitting: Nurse Practitioner

## 2022-01-23 ENCOUNTER — Encounter: Payer: Self-pay | Admitting: Nurse Practitioner

## 2022-01-23 ENCOUNTER — Other Ambulatory Visit: Payer: Self-pay

## 2022-01-23 DIAGNOSIS — S60221D Contusion of right hand, subsequent encounter: Secondary | ICD-10-CM

## 2022-01-23 DIAGNOSIS — S2001XD Contusion of right breast, subsequent encounter: Secondary | ICD-10-CM | POA: Diagnosis not present

## 2022-01-23 DIAGNOSIS — S8011XD Contusion of right lower leg, subsequent encounter: Secondary | ICD-10-CM

## 2022-01-23 DIAGNOSIS — M549 Dorsalgia, unspecified: Secondary | ICD-10-CM

## 2022-01-23 DIAGNOSIS — S301XXD Contusion of abdominal wall, subsequent encounter: Secondary | ICD-10-CM

## 2022-01-23 DIAGNOSIS — S8002XD Contusion of left knee, subsequent encounter: Secondary | ICD-10-CM

## 2022-01-23 DIAGNOSIS — M542 Cervicalgia: Secondary | ICD-10-CM

## 2022-01-23 DIAGNOSIS — M25561 Pain in right knee: Secondary | ICD-10-CM
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
## 2022-01-23 NOTE — Telephone Encounter (Signed)
Requested Prescriptions  ?Pending Prescriptions Disp Refills  ?? DULoxetine (CYMBALTA) 20 MG capsule [Pharmacy Med Name: DULOXETINE HCL DR 20 MG CAP] 60 capsule 3  ?  Sig: TAKE 2 CAPSULES BY MOUTH EVERY DAY  ?  ? Psychiatry: Antidepressants - SNRI - duloxetine Passed - 01/22/2022  2:27 AM  ?  ?  Passed - Cr in normal range and within 360 days  ?  Creat  ?Date Value Ref Range Status  ?09/13/2021 0.72 0.50 - 1.03 mg/dL Final  ? ?Creatinine, Ser  ?Date Value Ref Range Status  ?01/18/2022 0.70 0.44 - 1.00 mg/dL Final  ?   ?  ?  Passed - eGFR is 30 or above and within 360 days  ?  GFR, Est African American  ?Date Value Ref Range Status  ?02/08/2021 114 > OR = 60 mL/min/1.68m Final  ? ?GFR, Est Non African American  ?Date Value Ref Range Status  ?02/08/2021 99 > OR = 60 mL/min/1.759mFinal  ? ?GFR, Estimated  ?Date Value Ref Range Status  ?01/18/2022 >60 >60 mL/min Final  ?  Comment:  ?  (NOTE) ?Calculated using the CKD-EPI Creatinine Equation (2021) ?  ? ?eGFR  ?Date Value Ref Range Status  ?09/13/2021 101 > OR = 60 mL/min/1.735minal  ?  Comment:  ?  The eGFR is based on the CKD-EPI 2021 equation. To calculate  ?the new eGFR from a previous Creatinine or Cystatin C ?result, go to https://www.kidney.org/professionals/ ?kdoqi/gfr%5Fcalculator ?  ?   ?  ?  Passed - Completed PHQ-2 or PHQ-9 in the last 360 days  ?  ?  Passed - Last BP in normal range  ?  BP Readings from Last 1 Encounters:  ?01/18/22 116/84  ?   ?  ?  Passed - Valid encounter within last 6 months  ?  Recent Outpatient Visits   ?      ? 4 months [facility]  ? 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  ? 1 year ago Hypothyroidism due to acquired atrophy of thyroid  [facility]  ? 2 years [facility]  [facility]  ? 2  years ago Moderate episode of recurrent major depressive disorder ([place]? [facility]  ?  ?Future Appointments   ?        ? Today [facility] In 1 week [place], [place]  ? ?  ?  ?  ? ? ?

## 2022-01-30 ENCOUNTER — Other Ambulatory Visit: Payer: Self-pay

## 2022-01-31 ENCOUNTER — Other Ambulatory Visit: Payer: Self-pay | Admitting: Nurse Practitioner

## 2022-01-31 ENCOUNTER — Ambulatory Visit: Payer: 59 | Admitting: Gastroenterology

## 2022-01-31 DIAGNOSIS — R4701 Aphasia: Secondary | ICD-10-CM

## 2022-02-13 ENCOUNTER — Encounter: Payer: Self-pay | Admitting: Family Medicine

## 2022-02-14 ENCOUNTER — Other Ambulatory Visit: Payer: Self-pay | Admitting: Family Medicine

## 2022-02-14 DIAGNOSIS — E034 Atrophy of thyroid (acquired): Secondary | ICD-10-CM

## 2022-04-04 ENCOUNTER — Encounter: Payer: Self-pay | Admitting: Gastroenterology

## 2022-04-06 ENCOUNTER — Other Ambulatory Visit: Payer: Self-pay | Admitting: Nurse Practitioner

## 2022-04-06 ENCOUNTER — Encounter: Payer: Self-pay | Admitting: Nurse Practitioner

## 2022-04-06 DIAGNOSIS — G43009 Migraine without aura, not intractable, without status migrainosus: Secondary | ICD-10-CM

## 2022-04-06 DIAGNOSIS — B0229 Other postherpetic nervous system involvement: Secondary | ICD-10-CM

## 2022-04-09 ENCOUNTER — Encounter: Payer: Self-pay | Admitting: Anesthesiology

## 2022-04-09 ENCOUNTER — Other Ambulatory Visit: Payer: Self-pay

## 2022-04-09 ENCOUNTER — Encounter: Payer: Self-pay | Admitting: Gastroenterology

## 2022-04-09 ENCOUNTER — Encounter: Payer: Self-pay | Admitting: Nurse Practitioner

## 2022-04-09 DIAGNOSIS — K529 Noninfective gastroenteritis and colitis, unspecified: Secondary | ICD-10-CM

## 2022-04-12 ENCOUNTER — Telehealth: Payer: Self-pay | Admitting: Family Medicine

## 2022-04-12 NOTE — Telephone Encounter (Signed)
Copied from [place] [id number]. Topic: Referral - Request for Referral >> Apr 12, 2022  3:56 PM Everette C wrote: Has patient seen PCP for this complaint? No. *If NO, is insurance requiring patient see PCP for this issue before PCP can refer them? Referral for which specialty: Vein and Vascular Specialist  Preferred provider/office: [facility]  Reason for referral: vein concerns in both legs
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
## 2022-04-19 ENCOUNTER — Ambulatory Visit: Payer: Self-pay

## 2022-04-19 ENCOUNTER — Ambulatory Visit: Admission: RE | Admit: 2022-04-19 | Payer: 59 | Source: Home / Self Care | Admitting: Gastroenterology

## 2022-04-19 SURGERY — ESOPHAGOGASTRODUODENOSCOPY (EGD) WITH PROPOFOL
Anesthesia: Choice

## 2022-04-19 NOTE — Telephone Encounter (Signed)
Pt has an appointment.  
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
## 2022-04-27 DIAGNOSIS — M5416 Radiculopathy, lumbar region: Secondary | ICD-10-CM | POA: Diagnosis not present

## 2022-04-27 DIAGNOSIS — M5412 Radiculopathy, cervical region: Secondary | ICD-10-CM | POA: Diagnosis not present

## 2022-05-01 DIAGNOSIS — F411 Generalized anxiety disorder: Secondary | ICD-10-CM | POA: Diagnosis not present

## 2022-05-01 DIAGNOSIS — R69 Illness, unspecified: Secondary | ICD-10-CM | POA: Diagnosis not present

## 2022-05-02 ENCOUNTER — Other Ambulatory Visit: Payer: Self-pay

## 2022-05-02 ENCOUNTER — Ambulatory Visit (INDEPENDENT_AMBULATORY_CARE_PROVIDER_SITE_OTHER): Payer: 59 | Admitting: Nurse Practitioner

## 2022-05-02 ENCOUNTER — Encounter: Payer: Self-pay | Admitting: Nurse Practitioner

## 2022-05-02 VITALS — BP 124/62 | HR 93 | Temp 97.9°F | Resp 16 | Ht 65.0 in | Wt 231.6 lb

## 2022-05-02 DIAGNOSIS — M79605 Pain in left leg: Secondary | ICD-10-CM | POA: Diagnosis not present

## 2022-05-02 DIAGNOSIS — R600 Localized edema: Secondary | ICD-10-CM

## 2022-05-02 DIAGNOSIS — I8393 Asymptomatic varicose veins of bilateral lower extremities: Secondary | ICD-10-CM

## 2022-05-02 DIAGNOSIS — I872 Venous insufficiency (chronic) (peripheral): Secondary | ICD-10-CM

## 2022-05-02 DIAGNOSIS — I890 Lymphedema, not elsewhere classified: Secondary | ICD-10-CM

## 2022-05-02 DIAGNOSIS — M79604 Pain in right leg: Secondary | ICD-10-CM | POA: Insufficient documentation

## 2022-05-02 NOTE — Assessment & Plan Note (Signed)
Patient states she has had increased pain and swelling.  Last saw vascular in 2019.  Would like to go back to vascular.  Referral placed.  Discussed compression stockings and elevation.
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
## 2022-05-15 ENCOUNTER — Encounter: Payer: Self-pay | Admitting: Neurology

## 2022-05-15 ENCOUNTER — Ambulatory Visit: Payer: 59 | Admitting: Neurology

## 2022-05-15 VITALS — BP 149/81 | HR 71 | Ht 65.0 in | Wt 234.7 lb

## 2022-05-15 DIAGNOSIS — R479 Unspecified speech disturbances: Secondary | ICD-10-CM

## 2022-05-15 DIAGNOSIS — R4701 Aphasia: Secondary | ICD-10-CM

## 2022-05-15 DIAGNOSIS — G43709 Chronic migraine without aura, not intractable, without status migrainosus: Secondary | ICD-10-CM

## 2022-05-15 DIAGNOSIS — S0990XA Unspecified injury of head, initial encounter: Secondary | ICD-10-CM

## 2022-05-15 MED ORDER — RIZATRIPTAN BENZOATE 10 MG PO TBDP: 10.0000 mg | ORAL_TABLET | ORAL | 11 refills | Status: DC | PRN

## 2022-05-15 MED ORDER — AJOVY 225 MG/1.5ML ~~LOC~~ SOAJ: 225.0000 mg | SUBCUTANEOUS | 11 refills | Status: DC
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
## 2022-05-15 NOTE — Patient Instructions (Addendum)
- Recommend Ajovy or Emgality for prevention ((I don't recommend Aimovig)) monthly injections - Acute management try new medications Ubrelvy or Nurtec - would start on Ajovy or Emgality  - Can ask pcp about Wegovy for weight loss as onesity is a risk factor for chronic migraines - Try Rizatriptan instead of the sumatriptan: Please take one tablet at the onset of your headache. If it does not improve the symptoms please take one additional tablet. Do not take more then 2 tablets in 24hrs. Do not take use more then 2 to 3 times in a week. -  She snore, wake up frequently,morning headaches  Fremanezumab Injection What is this medication? FREMANEZUMAB (fre ma NEZ ue mab) prevents migraines. It works by blocking a substance in the body that causes migraines. It is a monoclonal antibody. This medicine may be used for other purposes; ask your health care provider or pharmacist if you have questions. COMMON BRAND NAME(S): AJOVY What should I tell my care team before I take this medication? They need to know if you have any of these conditions: An unusual or allergic reaction to fremanezumab, other medications, foods, dyes, or preservatives Pregnant or trying to get pregnant Breast-feeding How should I use this medication? This medication is injected under the skin. You will be taught how to prepare and give it. Take it as directed on the prescription label. Keep taking it unless your care team tells you to stop. It is important that you put your used needles and syringes in a special sharps container. Do not put them in a trash can. If you do not have a sharps container, call your pharmacist or care team to get one. Talk to your care team about the use of this medication in children. Special care may be needed. Overdosage: If you think you have taken too much of this medicine contact a poison control center or emergency room at once. NOTE: This medicine is only for you. Do not share this medicine with  others. What if I miss a dose? If you miss a dose, take it as soon as you can. If it is almost time for your next dose, take only that dose. Do not take double or extra doses. What may interact with this medication? Interactions are not expected. This list may not describe all possible interactions. Give your health care provider a list of all the medicines, herbs, non-prescription drugs, or dietary supplements you use. Also tell them if you smoke, drink alcohol, or use illegal drugs. Some items may interact with your medicine. What should I watch for while using this medication? Tell your care team if your symptoms do not start to get better or if they get worse. What side effects may I notice from receiving this medication? Side effects that you should report to your care team as soon as possible: Allergic reactions or angioedema--skin rash, itching or hives, swelling of the face, eyes, lips, tongue, arms, or legs, trouble swallowing or breathing Side effects that usually do not require medical attention (report to your care team if they continue or are bothersome): Pain, redness, or irritation at injection site This list may not describe all possible side effects. Call your doctor for medical advice about side effects. You may report side effects to FDA at 1-800-FDA-1088. Where should I keep my medication? Keep out of the reach of children and pets. Store in a refrigerator or at room temperature between 20 and 25 degrees C (68 and 77 degrees F). Refrigeration (preferred):  Store in the refrigerator. Do not freeze. Keep in the original container until you are ready to take it. Remove the dose from the carton about 30 minutes before it is time for you to use it. If the dose is not used, it may be stored in the original container at room temperature for 7 days. Get rid of any unused medication after the expiration date. Room Temperature: This medication may be stored at room temperature for up to 7  days. Keep it in the original container. Protect from light until time of use. If it is stored at room temperature, get rid of any unused medication after 7 days or after it expires, whichever is first. To get rid of medications that are no longer needed or have expired: Take the medication to a medication take-back program. Check with your pharmacy or law enforcement to find a location. If you cannot return the medication, ask your pharmacist or care team how to get rid of this medication safely. NOTE: This sheet is a summary. It may not cover all possible information. If you have questions about this medicine, talk to your doctor, pharmacist, or health care provider.  2023 Elsevier/Gold Standard (2017-06-11 00:00:00) Rizatriptan Disintegrating Tablets What is this medication? RIZATRIPTAN (rye za TRIP tan) treats migraines. It works by blocking pain signals and narrowing blood vessels in the brain. It belongs to a group of medications called triptans. It is not used to prevent migraines. This medicine may be used for other purposes; ask your health care provider or pharmacist if you have questions. COMMON BRAND NAME(S): Maxalt-MLT What should I tell my care team before I take this medication? They need to know if you have any of these conditions: Circulation problems in fingers and toes Diabetes Heart disease High blood pressure High cholesterol History of irregular heartbeat History of stroke Stomach or intestine problems Tobacco use An unusual or allergic reaction to rizatriptan, other medications, foods, dyes, or preservatives Pregnant or trying to get pregnant Breast-feeding How should I use this medication? Take this medication by mouth. Take it as directed on the prescription label. You do not need water to take this medication. Leave the tablet in the sealed pack until you are ready to take it. With dry hands, open the pack and gently remove the tablet. If the tablet breaks or  crumbles, throw it away. Use a new tablet. Place the tablet on the tongue and allow it to dissolve. Then, swallow it. Do not cut, crush, or chew this medication. Do not use it more often than directed. Talk to your care team about the use of this medication in children. While it may be prescribed for children as young as 6 years for selected conditions, precautions do apply. Overdosage: If you think you have taken too much of this medicine contact a poison control center or emergency room at once. NOTE: This medicine is only for you. Do not share this medicine with others. What if I miss a dose? This does not apply. This medication is not for regular use. What may interact with this medication? Do not take this medication with any of the following: Ergot alkaloids, such as dihydroergotamine, ergotamine MAOIs, such as Marplan, Nardil, Parnate Other medications for migraine headache, such as almotriptan, eletriptan, frovatriptan, naratriptan, sumatriptan, zolmitriptan This medication may also interact with the following: Certain medications for depression, anxiety, or other mental health conditions Propranolol This list may not describe all possible interactions. Give your health care provider a list of all  the medicines, herbs, non-prescription drugs, or dietary supplements you use. Also tell them if you smoke, drink alcohol, or use illegal drugs. Some items may interact with your medicine. What should I watch for while using this medication? Visit your care team for regular checks on your progress. Tell your care team if your symptoms do not start to get better or if they get worse. This medication may affect your coordination, reaction time, or judgment. Do not drive or operate machinery until you know how this medication affects you. Sit up or stand slowly to reduce the risk of dizzy or fainting spells. If you take migraine medications for 10 or more days a month, your migraines may get worse.  Keep a diary of headache days and medication use. Contact your care team if your migraine attacks occur more frequently. What side effects may I notice from receiving this medication? Side effects that you should report to your care team as soon as possible: Allergic reactions--skin rash, itching, hives, swelling of the face, lips, tongue, or throat Burning, pain, tingling, or color changes in the hands, arms, legs, or feet Heart attack--pain or tightness in the chest, shoulders, arms, or jaw, nausea, shortness of breath, cold or clammy skin, feeling faint or lightheaded Heart rhythm changes--fast or irregular heartbeat, dizziness, feeling faint or lightheaded, chest pain, trouble breathing Increase in blood pressure Irritability, confusion, fast or irregular heartbeat, muscle stiffness, twitching muscles, sweating, high fever, seizure, chills, vomiting, diarrhea, which may be signs of serotonin syndrome Raynaud syndrome--cool, numb, or painful fingers or toes that may change color from pale, to blue, to red Seizures Stroke--sudden numbness or weakness of the face, arm, or leg, trouble speaking, confusion, trouble walking, loss of balance or coordination, dizziness, severe headache, change in vision Sudden or severe stomach pain, bloody diarrhea, fever, nausea, vomiting Vision loss Side effects that usually do not require medical attention (report to your care team if they continue or are bothersome): Dizziness Unusual weakness or fatigue This list may not describe all possible side effects. Call your doctor for medical advice about side effects. You may report side effects to FDA at 1-800-FDA-1088. Where should I keep my medication? Keep out of the reach of children and pets. Store at room temperature between 15 and 30 degrees C (59 and 86 degrees F). Protect from light and moisture. Get rid of any unused medication after the expiration date. To get rid of medications that are no longer needed  or have expired: Take the medication to a medication take-back program. Check with your pharmacy or law enforcement to find a location. If you cannot return the medication, check the label or package insert to see if the medication should be thrown out in the garbage or flushed down the toilet. If you are not sure, ask your care team. If it is safe to put it in the trash, empty the medication out of the container. Mix the medication with cat litter, dirt, coffee grounds, or other unwanted substance. Seal the mixture in a bag or container. Put it in the trash. NOTE: This sheet is a summary. It may not cover all possible information. If you have questions about this medicine, talk to your doctor, pharmacist, or health care provider.  2023 Elsevier/Gold Standard (2022-01-11 00:00:00)

## 2022-05-16 ENCOUNTER — Other Ambulatory Visit: Payer: Self-pay | Admitting: Nurse Practitioner

## 2022-05-16 DIAGNOSIS — F330 Major depressive disorder, recurrent, mild: Secondary | ICD-10-CM

## 2022-05-16 NOTE — Telephone Encounter (Signed)
Requested Prescriptions  Pending Prescriptions Disp Refills  . DULoxetine (CYMBALTA) 20 MG capsule [Pharmacy Med Name: DULOXETINE HCL DR 20 MG CAP] 180 capsule 0    Sig: TAKE 2 CAPSULES BY MOUTH EVERY DAY     Psychiatry: Antidepressants - SNRI - duloxetine Failed - 05/16/2022  1:59 AM      Failed - Last BP in normal range    BP Readings from Last 1 Encounters:  05/15/22 (!) 149/81         Passed - Cr in normal range and within 360 days    Creat  Date Value Ref Range Status  09/13/2021 0.72 0.50 - 1.03 mg/dL Final   Creatinine, Ser  Date Value Ref Range Status  01/18/2022 0.70 0.44 - 1.00 mg/dL Final         Passed - eGFR is 30 or above and within 360 days    GFR, Est African American  Date Value Ref Range Status  02/08/2021 114 > OR = 60 mL/min/1.13m Final   GFR, Est Non African American  Date Value Ref Range Status  02/08/2021 99 > OR = 60 mL/min/1.723mFinal   GFR, Estimated  Date Value Ref Range Status  01/18/2022 >60 >60 mL/min Final    Comment:    (NOTE) Calculated using the CKD-EPI Creatinine Equation (2021)    eGFR  Date Value Ref Range Status  09/13/2021 101 > OR = 60 mL/min/1.7348minal    Comment:    The eGFR is based on the CKD-EPI 2021 equation. To calculate  the new eGFR from a previous Creatinine or Cystatin C result, go to [email or web address].[email or web address].org/professionals/ kdoqi/[email or web address]          Passed - Completed PHQ-2 or PHQ-9 in the last 360 days      Passed - Valid encounter within last 6 months    Recent Outpatient Visits          2 weeks ago [facility] venous insufficiency   [facility]   3 months [facility]   8 months ago [facility]ential [facility]   1 year ago [facility] to [facility]   1 year ago [facility] to [facility]      Future Appointments            [facility]

## 2022-05-17 ENCOUNTER — Telehealth: Payer: Self-pay | Admitting: *Deleted

## 2022-05-17 NOTE — Telephone Encounter (Signed)
Completed Ajovy PA on Cover My Meds. KeyMilagros Osborn - PA Case ID: [id number] - Rx #: [id number]. Awaiting determination from [facility].

## 2022-05-17 NOTE — Telephone Encounter (Signed)
Faxed office note to Cover My Meds. Received a receipt of confirmation.

## 2022-05-23 NOTE — Telephone Encounter (Signed)
Appeal form received, completed, included office note and copy of denial letter, then faxed to [facility] Provider Resolution Team. Received a receipt of confirmation.

## 2022-05-23 NOTE — Telephone Encounter (Signed)
Spoke with Saralyn Pilar @ [address] Call reference # [id number]. I did not have the option of doing an appeal over the phone. An appeal form will be faxed to our office to complete and fax back with clinicals.

## 2022-05-23 NOTE — Telephone Encounter (Signed)
Ajovy denied by [employer].   Coverage for this medication is denied for the following reason(s). We reviewed the information we received about your condition and circumstances. We used the plan approved policy when making this decision. The policy states that this medication may be approved when: -The member has a clinical condition or needs a specific dosage form for which there is no alternative on the formulary OR -The listed formulary alternatives are not recommended based on published guidelines or clinical literature OR -The formulary alternatives will likely be ineffective or less effective for the member OR -The formulary alternatives will likely cause an adverse effect OR -The member is unable to take the required number of formulary alternatives for the given diagnosis due to a trial and inadequate treatment response or contraindication OR -The member has tried and failed the required number of formulary alternatives. Based on the policy and the information we have, your request is denied. We did not receive any documentation that you meet any of the criteria outlined above. Formulary alternative(s) are Aimovig. Requirement: 3 in a class with 3 or more alternatives, 2 in a class with 2 alternatives, or 1 in a class with only 1 alternative. Please refer to your plan documents for a complete list of alternatives.  To appeal:   How to ask for an appeal by phone Call Member Services. The toll-free telephone number is listed on your member ID card.  Your request should include:  Your name;  Your member ID number (or date of birth) or other identifying information;  The group's name (for example, if you are covered by your employer);  Comments, documents, records, and other information you want [place] to consider  PA# [facility] [place] [phone]

## 2022-05-24 DIAGNOSIS — M5416 Radiculopathy, lumbar region: Secondary | ICD-10-CM | POA: Diagnosis not present

## 2022-05-25 DIAGNOSIS — M542 Cervicalgia: Secondary | ICD-10-CM | POA: Diagnosis not present

## 2022-05-25 DIAGNOSIS — M545 Low back pain, unspecified: Secondary | ICD-10-CM | POA: Diagnosis not present

## 2022-05-25 DIAGNOSIS — S134XXS Sprain of ligaments of cervical spine, sequela: Secondary | ICD-10-CM | POA: Diagnosis not present

## 2022-06-01 DIAGNOSIS — R69 Illness, unspecified: Secondary | ICD-10-CM | POA: Diagnosis not present

## 2022-06-01 DIAGNOSIS — F411 Generalized anxiety disorder: Secondary | ICD-10-CM | POA: Diagnosis not present

## 2022-06-11 ENCOUNTER — Encounter (INDEPENDENT_AMBULATORY_CARE_PROVIDER_SITE_OTHER): Payer: Self-pay | Admitting: Vascular Surgery

## 2022-06-11 ENCOUNTER — Ambulatory Visit (INDEPENDENT_AMBULATORY_CARE_PROVIDER_SITE_OTHER): Payer: 59 | Admitting: Vascular Surgery

## 2022-06-11 VITALS — BP 149/86 | HR 76 | Resp 17 | Ht 65.0 in | Wt 232.4 lb

## 2022-06-11 DIAGNOSIS — I10 Essential (primary) hypertension: Secondary | ICD-10-CM

## 2022-06-11 DIAGNOSIS — I872 Venous insufficiency (chronic) (peripheral): Secondary | ICD-10-CM | POA: Diagnosis not present

## 2022-06-11 DIAGNOSIS — I890 Lymphedema, not elsewhere classified: Secondary | ICD-10-CM

## 2022-06-11 DIAGNOSIS — E034 Atrophy of thyroid (acquired): Secondary | ICD-10-CM | POA: Diagnosis not present

## 2022-06-11 DIAGNOSIS — E782 Mixed hyperlipidemia: Secondary | ICD-10-CM | POA: Diagnosis not present

## 2022-06-11 NOTE — Progress Notes (Unsigned)
MRN : [id number]  Nicole Osborn is a 51 y.o. ([birth year]) female who presents with chief complaint of legs hurt and swell.  History of Present Illness:   The patient is seen for evaluation of symptomatic varicose veins. The patient relates stinging, particularly on the tops of the feet which worsened steadily throughout the course of the day, particularly with walking.  She notes that is discomfort makes it very difficult to walk and is having trouble maintaining her exercise.  The patient also notes an aching pain over the medial calf when she wears compression, particularly with prolonged dependent positions. The symptoms are improved with elevation.   The patient states the discomfort interferes with work, daily exercise, shopping and household maintenance. At this point, the symptoms are persistent and severe enough that they're having a negative impact on lifestyle and are interfering with daily activities.  There is no history of DVT, PE or superficial thrombophlebitis. There is no history of ulceration or hemorrhage. The patient denies a significant family history of varicose veins.  The patient has not worn graduated compression in the past. At the present time the patient has not been using over-the-counter analgesics. There is no history of prior surgical intervention or sclerotherapy.   Previous venous duplex from 2019:  bilateral lower extremity venous reflux study which was notable for Right: Abnormal reflux times were noted in the great saphenous vein at the knee and great saphenous vein at the proximal calf.  Evidence of chronic venous insufficiency is detected in the deep system.  There is no evidence of deep vein thrombosis or superficial thrombophlebitis. Left: Abnormal reflux times were noted in the common femoral vein.  Evidence of chronic venous insufficiency is detected in the deep system.  There is no evidence of deep vein thrombosis or superficial  thrombophlebitis.  No outpatient medications have been marked as taking for the 06/11/22 encounter (Appointment) with Delana Meyer, Dolores Lory, MD.    Past Medical History:  Diagnosis Date   Anxiety    Arthritis    Cholecystitis with cholelithiasis 09/19/2017   Chronic venous insufficiency of lower extremity    Depression    Elevated blood pressure reading    no meds   GERD (gastroesophageal reflux disease)    occ   Heartburn    History of kidney stones    h/o   Hyperlipidemia    Hypothyroidism    Migraine 09/23/2017   Migraines    without aura   Obesity    Thyroid disease    Varicose veins of both lower extremities 09/06/2017    Past Surgical History:  Procedure Laterality Date   CHOLECYSTECTOMY N/A 10/08/2017   Procedure: LAPAROSCOPIC CHOLECYSTECTOMY;  Surgeon: Florene Glen, MD;  Location: [facility] ORS;  Service: General;  Laterality: N/A;   COLONOSCOPY WITH PROPOFOL N/A 12/11/2019   Procedure: COLONOSCOPY WITH PROPOFOL;  Surgeon: Lin Landsman, MD;  Location: [facility] ENDOSCOPY;  Service: Gastroenterology;  Laterality: N/A;   Removal of extra tooth     age 51    Social History Social History   Tobacco Use   Smoking status: Never   Smokeless tobacco: Never  Vaping Use   Vaping Use: Never used  Substance Use Topics   Alcohol use: No   Drug use: No    Family History Family History  Problem Relation Age of Onset   Migraines Mother    Depression Mother    Arthritis Mother  Stroke Father    Hypertension Father    Migraines Maternal Uncle    Breast cancer Paternal Aunt 20   Emphysema Maternal Grandmother    Emphysema Maternal Grandfather    Dementia Paternal Grandmother    Asthma Son    Eczema Son    Kidney disease Neg Hx    Bladder Cancer Neg Hx     Allergies  Allergen Reactions   Wellbutrin [Bupropion] Other (See Comments)    Reaction - "insects crawling in my head"   Cefaclor Swelling    Lips swelling    Peanut-Containing Drug Products       REVIEW OF SYSTEMS (Negative unless checked)  Constitutional: '[]'$ Weight loss  '[]'$ Fever  '[]'$ Chills Cardiac: '[]'$ Chest pain   '[]'$ Chest pressure   '[]'$ Palpitations   '[]'$ Shortness of breath when laying flat   '[]'$ Shortness of breath with exertion. Vascular:  '[]'$ Pain in legs with walking   '[x]'$ Pain in legs at rest  '[]'$ History of DVT   '[]'$ Phlebitis   '[x]'$ Swelling in legs   '[]'$ Varicose veins   '[]'$ Non-healing ulcers Pulmonary:   '[]'$ Uses home oxygen   '[]'$ Productive cough   '[]'$ Hemoptysis   '[]'$ Wheeze  '[]'$ COPD   '[]'$ Asthma Neurologic:  '[]'$ Dizziness   '[]'$ Seizures   '[]'$ History of stroke   '[]'$ History of TIA  '[]'$ Aphasia   '[]'$ Vissual changes   '[]'$ Weakness or numbness in arm   '[]'$ Weakness or numbness in leg Musculoskeletal:   '[]'$ Joint swelling   '[]'$ Joint pain   '[]'$ Low back pain Hematologic:  '[]'$ Easy bruising  '[]'$ Easy bleeding   '[]'$ Hypercoagulable state   '[]'$ Anemic Gastrointestinal:  '[]'$ Diarrhea   '[]'$ Vomiting  '[]'$ Gastroesophageal reflux/heartburn   '[]'$ Difficulty swallowing. Genitourinary:  '[]'$ Chronic kidney disease   '[]'$ Difficult urination  '[]'$ Frequent urination   '[]'$ Blood in urine Skin:  '[]'$ Rashes   '[]'$ Ulcers  Psychological:  '[]'$ History of anxiety   '[]'$  History of major depression.  Physical Examination  There were no vitals filed for this visit. There is no height or weight on file to calculate BMI. Gen: WD/WN, NAD Head: [place]/AT, No temporalis wasting.  Ear/Nose/Throat: Hearing grossly intact, nares w/o erythema or drainage, pinna without lesions Eyes: PER, EOMI, sclera nonicteric.  Neck: Supple, no gross masses.  No JVD.  Pulmonary:  Good air movement, no audible wheezing, no use of accessory muscles.  Cardiac: RRR, precordium not hyperdynamic. Vascular:  scattered varicosities present bilaterally.  Moderate venous stasis changes to the legs bilaterally.  3-4+ soft pitting edema  Vessel Right Left  Radial Palpable Palpable  Gastrointestinal: soft, non-distended. No guarding/no peritoneal signs.  Musculoskeletal: M/S 5/5 throughout.  No deformity.   Neurologic: CN 2-12 intact. Pain and light touch intact in extremities.  Symmetrical.  Speech is fluent. Motor exam as listed above. Psychiatric: Judgment intact, Mood & affect appropriate for pt's clinical situation. Dermatologic: Venous rashes no ulcers noted.  No changes consistent with cellulitis. Lymph : No lichenification or skin changes of chronic lymphedema.  CBC Lab Results  Component Value Date   WBC 9.2 01/18/2022   HGB 13.3 01/18/2022   HCT 39.0 01/18/2022   MCV 89.5 01/18/2022   PLT 219 01/18/2022    BMET    Component Value Date/Time   NA 142 01/18/2022 1214   K 3.9 01/18/2022 1214   CL 105 01/18/2022 1214   CO2 26 01/18/2022 1140   GLUCOSE 103 (H) 01/18/2022 1214   BUN 12 01/18/2022 1214   CREATININE 0.70 01/18/2022 1214   CREATININE 0.72 09/13/2021 1402   CALCIUM 8.9 01/18/2022 1140   GFRNONAA >60 01/18/2022 1140  GFRNONAA 99 02/08/2021 1600   GFRAA 114 02/08/2021 1600   CrCl cannot be calculated (Patient's most recent lab result is older than the maximum 21 days allowed.).  COAG Lab Results  Component Value Date   INR 1.1 01/18/2022   INR 1.0 10/27/2019   INR 1.0 07/17/2019    Radiology No results found.   Assessment/Plan 1. Lymphedema Recommend:  I have had a long discussion with the patient regarding swelling and why it  causes symptoms.  Patient will begin wearing graduated compression on a daily basis a prescription was given. The patient will  wear the stockings first thing in the morning and removing them in the evening. The patient is instructed specifically not to sleep in the stockings.   In addition, behavioral modification will be initiated.  This will include frequent elevation, use of over the counter pain medications and exercise such as walking.  Consideration for a lymph pump will also be made based upon the effectiveness of conservative therapy.  This would help to improve the edema control and prevent sequela such as ulcers and  infections   Patient should undergo duplex ultrasound of the venous system to ensure that DVT or reflux is not present.  The patient will follow-up with me after the ultrasound.   - VAS [place] LOWER EXTREMITY VENOUS REFLUX; Future  2. Chronic venous insufficiency Recommend:  I have had a long discussion with the patient regarding swelling and why it  causes symptoms.  Patient will begin wearing graduated compression on a daily basis a prescription was given. The patient will  wear the stockings first thing in the morning and removing them in the evening. The patient is instructed specifically not to sleep in the stockings.   In addition, behavioral modification will be initiated.  This will include frequent elevation, use of over the counter pain medications and exercise such as walking.  Consideration for a lymph pump will also be made based upon the effectiveness of conservative therapy.  This would help to improve the edema control and prevent sequela such as ulcers and infections   Patient should undergo duplex ultrasound of the venous system to ensure that DVT or reflux is not present.  The patient will follow-up with me after the ultrasound.   - VAS [place] LOWER EXTREMITY VENOUS REFLUX; Future  3. Primary hypertension Continue antihypertensive medications as already ordered, these medications have been reviewed and there are no changes at this time.   4. Hypothyroidism due to acquired atrophy of thyroid Continue hormone replacement as ordered and reviewed, no changes at this time   5. Mixed hyperlipidemia Continue statin as ordered and reviewed, no changes at this time     Hortencia Pilar, MD  06/11/2022 8:29 AM

## 2022-06-12 ENCOUNTER — Encounter (INDEPENDENT_AMBULATORY_CARE_PROVIDER_SITE_OTHER): Payer: Self-pay | Admitting: Vascular Surgery

## 2022-06-13 ENCOUNTER — Encounter (INDEPENDENT_AMBULATORY_CARE_PROVIDER_SITE_OTHER): Payer: Self-pay | Admitting: Vascular Surgery

## 2022-06-19 ENCOUNTER — Encounter: Payer: Self-pay | Admitting: Nurse Practitioner

## 2022-06-26 ENCOUNTER — Encounter: Payer: Self-pay | Admitting: Neurology

## 2022-07-04 ENCOUNTER — Encounter: Payer: Self-pay | Admitting: Nurse Practitioner

## 2022-07-09 ENCOUNTER — Encounter: Payer: Self-pay | Admitting: Neurology

## 2022-07-09 DIAGNOSIS — R479 Unspecified speech disturbances: Secondary | ICD-10-CM

## 2022-07-09 DIAGNOSIS — S0990XA Unspecified injury of head, initial encounter: Secondary | ICD-10-CM

## 2022-07-09 DIAGNOSIS — R4701 Aphasia: Secondary | ICD-10-CM

## 2022-07-10 ENCOUNTER — Telehealth: Payer: Self-pay | Admitting: Neurology

## 2022-07-10 NOTE — Telephone Encounter (Signed)
I messaged the pt in mychart.
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
## 2022-07-10 NOTE — Telephone Encounter (Signed)
[employer] sent to [facility] they obtain auth [id number] 

## 2022-07-10 NOTE — Addendum Note (Signed)
Addended by: Sarina Ill B on: 07/10/2022 09:34 AM   Modules accepted: Orders

## 2022-07-11 ENCOUNTER — Telehealth: Payer: Self-pay | Admitting: Neurology

## 2022-07-11 NOTE — Addendum Note (Signed)
Addended by: Gildardo Griffes on: 07/11/2022 12:58 PM   Modules accepted: Orders

## 2022-07-11 NOTE — Telephone Encounter (Signed)
Referral for Speech Therapy faxed to [facility].  Phone: [phone], Fax: [phone]

## 2022-07-19 ENCOUNTER — Encounter: Payer: Self-pay | Admitting: Speech Pathology

## 2022-07-19 ENCOUNTER — Ambulatory Visit: Payer: 59 | Attending: Neurology | Admitting: Speech Pathology

## 2022-07-19 DIAGNOSIS — R41841 Cognitive communication deficit: Secondary | ICD-10-CM | POA: Insufficient documentation
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
## 2022-07-23 ENCOUNTER — Encounter (INDEPENDENT_AMBULATORY_CARE_PROVIDER_SITE_OTHER): Payer: Self-pay

## 2022-08-06 DIAGNOSIS — M659 Synovitis and tenosynovitis, unspecified: Secondary | ICD-10-CM | POA: Diagnosis not present

## 2022-08-06 DIAGNOSIS — M778 Other enthesopathies, not elsewhere classified: Secondary | ICD-10-CM | POA: Diagnosis not present

## 2022-08-06 DIAGNOSIS — R7303 Prediabetes: Secondary | ICD-10-CM | POA: Diagnosis not present

## 2022-08-08 DIAGNOSIS — F411 Generalized anxiety disorder: Secondary | ICD-10-CM | POA: Diagnosis not present

## 2022-08-08 DIAGNOSIS — R69 Illness, unspecified: Secondary | ICD-10-CM | POA: Diagnosis not present

## 2022-08-09 ENCOUNTER — Encounter: Payer: Self-pay | Admitting: *Deleted

## 2022-08-14 ENCOUNTER — Other Ambulatory Visit: Payer: Self-pay | Admitting: Family Medicine

## 2022-08-14 DIAGNOSIS — F330 Major depressive disorder, recurrent, mild: Secondary | ICD-10-CM

## 2022-09-07 DIAGNOSIS — F411 Generalized anxiety disorder: Secondary | ICD-10-CM | POA: Diagnosis not present

## 2022-09-07 DIAGNOSIS — R69 Illness, unspecified: Secondary | ICD-10-CM | POA: Diagnosis not present

## 2022-09-12 ENCOUNTER — Telehealth: Payer: Self-pay | Admitting: Neurology

## 2022-09-12 ENCOUNTER — Other Ambulatory Visit: Payer: Self-pay | Admitting: Neurology

## 2022-09-12 ENCOUNTER — Encounter: Payer: Self-pay | Admitting: Neurology

## 2022-09-12 ENCOUNTER — Telehealth: Payer: 59 | Admitting: Neurology

## 2022-09-12 DIAGNOSIS — G43709 Chronic migraine without aura, not intractable, without status migrainosus: Secondary | ICD-10-CM | POA: Diagnosis not present

## 2022-09-12 MED ORDER — AIMOVIG 140 MG/ML ~~LOC~~ SOAJ
140.0000 mg | SUBCUTANEOUS | 11 refills | Status: DC
Start: 2022-09-12 — End: 2022-09-13

## 2022-09-12 NOTE — Telephone Encounter (Signed)
I called and explained process to pt and she was in line at drive thru and then driving and asked for the information to be sent via mychart.  I said would be glad to do.

## 2022-09-12 NOTE — Telephone Encounter (Signed)
I initiated [place] for Aimovig '140mg'$ /ml.  [employer]

## 2022-09-12 NOTE — Telephone Encounter (Signed)
Let patient know I spoke with insurance and they prefer once monthly Aimovig instead of Ajovy. If she has any problems getting it call us or mychart next week. There is a copay card she can fill out. And Schedule her for video or office in 4 months with NP. Most common side effect is constipation which she said may be good since she has a problem with diarrhea. Thanks. Called it in to [facility].
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
## 2022-09-13 ENCOUNTER — Telehealth: Payer: Self-pay | Admitting: Neurology

## 2022-09-13 NOTE — Telephone Encounter (Signed)
error 

## 2022-09-13 NOTE — Telephone Encounter (Signed)
Pt scheduled for vv f/u with Megan for 01/15/22 at 3:45pm

## 2022-09-13 NOTE — Telephone Encounter (Signed)
Schedule follow up with Np in 4 months (with NP) she can come to the office of video thanks

## 2022-09-25 NOTE — Telephone Encounter (Signed)
I called [employer] the previous CMM Key was rejected, not enough information.  I re initiated to [facility] and determination pending.

## 2022-09-27 NOTE — Telephone Encounter (Addendum)
Received approval from [place]. Aimovig has been approved from 09/27/2022 through 09/28/2023. I faxed the approval letter to pt's pharmacy and notified her via mychart. PA # [id number]

## 2022-10-08 ENCOUNTER — Ambulatory Visit (INDEPENDENT_AMBULATORY_CARE_PROVIDER_SITE_OTHER): Payer: 59 | Admitting: Vascular Surgery

## 2022-12-01 ENCOUNTER — Other Ambulatory Visit: Payer: Self-pay | Admitting: Family Medicine

## 2022-12-01 DIAGNOSIS — F330 Major depressive disorder, recurrent, mild: Secondary | ICD-10-CM

## 2022-12-03 MED ORDER — DULOXETINE HCL 20 MG PO CPEP: 40.0000 mg | ORAL_CAPSULE | Freq: Every day | ORAL | 0 refills | Status: DC

## 2022-12-03 NOTE — Telephone Encounter (Signed)
Lvm for pt to call and schedule an appt  

## 2022-12-03 NOTE — Telephone Encounter (Signed)
Patient needs appt

## 2022-12-03 NOTE — Addendum Note (Signed)
Addended by: Delsa Grana on: 12/03/2022 10:29 AM   Modules accepted: Orders

## 2022-12-21 ENCOUNTER — Telehealth: Payer: Self-pay | Admitting: Family Medicine

## 2022-12-21 ENCOUNTER — Encounter: Payer: Self-pay | Admitting: Nurse Practitioner

## 2022-12-21 DIAGNOSIS — Z1231 Encounter for screening mammogram for malignant neoplasm of breast: Secondary | ICD-10-CM

## 2022-12-21 NOTE — Telephone Encounter (Signed)
Pt is calling to request to switch providers to [facility]. Please advise CB- [phone]

## 2023-01-08 NOTE — Progress Notes (Signed)
BP 122/84   Pulse 83   Temp 97.6 F (36.4 C)   Resp 18   Ht 5\' 5"  (1.651 m)   Wt 230 lb 8 oz (104.6 kg)   LMP 07/12/2016   SpO2 94%   BMI 38.36 kg/m    Subjective:    Patient ID: Nicole Osborn, female    DOB: [birth year], 52 y.o.   MRN: [id number]  HPI: Nicole Osborn is a 52 y.o. female, here alone  Chief Complaint  Patient presents with   Follow-up   Medication Refill   HTN: she is no longer taking blood pressure medication. She denies any chest pain, shortness of breath, or blurred vision.  Her blood pressure today is 122/84.   Migraines/posthepatic neuralgia right side of face: is followed by Dr. Lucia Gaskins, neurology.  Last seen on 09/12/2022. She was prescribed aimovig but does not take it.  She says that she takes the sumatriptan when she has one.  She says she has had about 8-10 migraines a month.  She says she needs a refill of her imitrex. Refill sent.   Depression/ anxiety: she says she is taking the cymbalta 40 mg daily.  PHQ9 and GAD scores were negative.  Patient reports she is doing well.      01/09/2023    8:39 AM 05/02/2022    9:45 AM 01/23/2022    1:27 PM 09/13/2021    1:30 PM 02/08/2021    3:21 PM  Depression screen PHQ 2/9  Decreased Interest 0 2 2 1 2   Down, Depressed, Hopeless 1 2 1 1 1   PHQ - 2 Score 1 4 3 2 3   Altered sleeping 0 2 0 0 3  Tired, decreased energy 0 1 1 0 3  Change in appetite 0 2 0 0 3  Feeling bad or failure about yourself  0 2 0 0 2  Trouble concentrating 0 2 2 0 2  Moving slowly or fidgety/restless 0 1 0 0 1  Suicidal thoughts 0 0 0 0 0  PHQ-9 Score 1 14 6 2 17   Difficult doing work/chores Not difficult at all Very difficult Somewhat difficult Not difficult at all Very difficult       09/13/2021    1:31 PM 02/08/2021    3:21 PM 09/05/2020   11:17 AM 12/15/2019    2:39 PM  GAD 7 : Generalized Anxiety Score  Nervous, Anxious, on Edge 0 2 1 3   Control/stop worrying 0 1 0 1  Worry too much - different things 0 2 0 3  Trouble  relaxing 0 3 1 2   Restless 0 1 0 3  Easily annoyed or irritable 0 2 1 1   Afraid - awful might happen 0 1 0 2  Total GAD 7 Score 0 12 3 15   Anxiety Difficulty Not difficult at all Somewhat difficult Somewhat difficult Somewhat difficult     Hyperlipidemia: her last LDL was 146 on 09/13/2021.  She is not currently on any treatment.  Will get labs today.  The 10-year ASCVD risk score (Arnett DK, et al., 2019) is: 2.3%   Values used to calculate the score:     Age: 66 years     Sex: Female     Is Non-Hispanic African American: No     Diabetic: No     Tobacco smoker: No     Systolic Blood Pressure: 122 mmHg     Is BP treated: No     HDL Cholesterol: 40 mg/dL  Total Cholesterol: 218 mg/dL   Hypothyroid: Last TSH was 0.93 on 09/13/2021. She is currently taking levothyroxine 112 mcg daily.  She reports she is doing well. She has not noticed any problems.    Obesity:  Her weight today is 230 lbs with a BMI of 38.36. she does have comorbidity that include: HTN, hyperlipidemia,migraines,  hypothyroidism, anxiety and depression. Continue working on lifestyle modification.   Relevant past medical, surgical, family and social history reviewed and updated as indicated. Interim medical history since our last visit reviewed. Allergies and medications reviewed and updated.  Review of Systems  Constitutional: Negative for fever or weight change.  Respiratory: Positive for cough  and shortness of breath since the beginning of the year  Cardiovascular: Negative for chest pain or palpitations.  Gastrointestinal: Negative for abdominal pain, positive for diarrhea Musculoskeletal: Negative for gait problem or joint swelling.  Skin: Negative for rash.  Neurological: Negative for dizziness , positive for  headache.  No other specific complaints in a complete review of systems (except as listed in HPI above).      Objective:    BP 122/84   Pulse 83   Temp 97.6 F (36.4 C)   Resp 18   Ht 5\' 5"   (1.651 m)   Wt 230 lb 8 oz (104.6 kg)   LMP 07/12/2016   SpO2 94%   BMI 38.36 kg/m   Wt Readings from Last 3 Encounters:  01/09/23 230 lb 8 oz (104.6 kg)  06/11/22 232 lb 6.4 oz (105.4 kg)  05/15/22 234 lb 11.2 oz (106.5 kg)    Physical Exam  Constitutional: Patient appears well-developed and well-nourished. Obese No distress.  HEENT: head atraumatic, normocephalic, pupils equal and reactive to light, neck supple Cardiovascular: Normal rate, regular rhythm and normal heart sounds.  No murmur heard. No BLE edema. Pulmonary/Chest: Effort normal and breath sounds normal. No respiratory distress. Abdominal: Soft.  There is no tenderness. Psychiatric: Patient has a normal mood and affect. behavior is normal. Judgment and thought content normal.   Results for orders placed or performed during the hospital encounter of 01/18/22  Comprehensive metabolic panel  Result Value Ref Range   Sodium 141 135 - 145 mmol/L   Potassium 3.8 3.5 - 5.1 mmol/L   Chloride 109 98 - 111 mmol/L   CO2 26 22 - 32 mmol/L   Glucose, Bld 105 (H) 70 - 99 mg/dL   BUN 10 6 - 20 mg/dL   Creatinine, Ser 1.61 0.44 - 1.00 mg/dL   Calcium 8.9 8.9 - 09.6 mg/dL   Total Protein 6.6 6.5 - 8.1 g/dL   Albumin 3.6 3.5 - 5.0 g/dL   AST 21 15 - 41 U/L   ALT 16 0 - 44 U/L   Alkaline Phosphatase 72 38 - 126 U/L   Total Bilirubin 0.4 0.3 - 1.2 mg/dL   GFR, Estimated >04 >54 mL/min   Anion gap 6 5 - 15  CBC  Result Value Ref Range   WBC 9.2 4.0 - 10.5 K/uL   RBC 4.38 3.87 - 5.11 MIL/uL   Hemoglobin 13.2 12.0 - 15.0 g/dL   HCT 09.8 11.9 - 14.7 %   MCV 89.5 80.0 - 100.0 fL   MCH 30.1 26.0 - 34.0 pg   MCHC 33.7 30.0 - 36.0 g/dL   RDW 82.9 56.2 - 13.0 %   Platelets 219 150 - 400 K/uL   nRBC 0.0 0.0 - 0.2 %  Ethanol  Result Value Ref Range   Alcohol,  Ethyl (B) <10 <10 mg/dL  Lactic acid, plasma  Result Value Ref Range   Lactic Acid, Venous 1.9 0.5 - 1.9 mmol/L  Protime-INR  Result Value Ref Range   Prothrombin Time  14.2 11.4 - 15.2 seconds   INR 1.1 0.8 - 1.2  I-Stat Chem 8, ED  Result Value Ref Range   Sodium 142 135 - 145 mmol/L   Potassium 3.9 3.5 - 5.1 mmol/L   Chloride 105 98 - 111 mmol/L   BUN 12 6 - 20 mg/dL   Creatinine, Ser 7.82 0.44 - 1.00 mg/dL   Glucose, Bld 956 (H) 70 - 99 mg/dL   Calcium, Ion 2.13 1.15 - 1.40 mmol/L   TCO2 28 22 - 32 mmol/L   Hemoglobin 13.3 12.0 - 15.0 g/dL   HCT 08.6 57.8 - 46.9 %  I-Stat beta hCG blood, ED  Result Value Ref Range   I-stat hCG, quantitative <5.0 <5 mIU/mL   Comment 3          Sample to Blood Bank  Result Value Ref Range   Blood Bank Specimen SAMPLE AVAILABLE FOR TESTING    Sample Expiration      01/19/2022,2359 Performed at [facility], [address] [place], [place] [address]       Assessment & Plan:   Problem List Items Addressed This Visit       Cardiovascular and Mediastinum   Essential hypertension - Primary    Blood pressure 122/84.  Patient is not on medication at this time.  She is doing well.      Relevant Orders   CBC with Differential/Platelet   COMPLETE METABOLIC PANEL WITH GFR   Migraine    Has about 10 migraines a month.  Patient is seen by neurology was prescribed aimovig but has not started it yet.  Patient does need a refill of her Imitrex.  Refill sent      Relevant Medications   DULoxetine (CYMBALTA) 20 MG capsule   SUMAtriptan (IMITREX) 100 MG tablet     Endocrine   Hypothyroidism, unspecified    Currently  takes levothyroxine 112 mcg daily.  Will check labs today.       Relevant Orders   TSH     Nervous and Auditory   Postherpetic neuralgia    Followed by Neurology.        Other   Hyperlipidemia    Not currently on cholesterol-lowering medication.  Will get labs today.      Relevant Orders   Lipid panel   Anxiety disorder    Continue taking Cymbalta 40 mg daily.      Relevant Medications   DULoxetine (CYMBALTA) 20 MG capsule   Class 2 severe obesity with serious comorbidity  and body mass index (BMI) of 38.0 to 38.9 in adult    Her weight today is 230 lbs with a BMI of 38.36. she does have comorbidity that include: HTN, hyperlipidemia,migraines,  hypothyroidism, anxiety and depression. Continue working on lifestyle modification.       Mild episode of recurrent major depressive disorder    Patient currently taking Cymbalta 40 mg daily.  Patient reports she is doing well.      Relevant Medications   DULoxetine (CYMBALTA) 20 MG capsule   Other Visit Diagnoses     Screening for diabetes mellitus       Relevant Orders   Hemoglobin A1c   Encounter for hepatitis C screening test for low risk patient  Relevant Orders   Hepatitis C antibody   Screening mammogram for breast cancer       Relevant Orders   MM 3D SCREENING MAMMOGRAM BILATERAL BREAST        Follow up plan: Return for AWV & CPE.

## 2023-01-09 ENCOUNTER — Encounter: Payer: Self-pay | Admitting: Nurse Practitioner

## 2023-01-09 ENCOUNTER — Ambulatory Visit (INDEPENDENT_AMBULATORY_CARE_PROVIDER_SITE_OTHER): Payer: Medicare Other | Admitting: Nurse Practitioner

## 2023-01-09 VITALS — BP 122/84 | HR 83 | Temp 97.6°F | Resp 18 | Ht 65.0 in | Wt 230.5 lb

## 2023-01-09 DIAGNOSIS — Z1159 Encounter for screening for other viral diseases: Secondary | ICD-10-CM

## 2023-01-09 DIAGNOSIS — I10 Essential (primary) hypertension: Secondary | ICD-10-CM | POA: Diagnosis not present

## 2023-01-09 DIAGNOSIS — K589 Irritable bowel syndrome without diarrhea: Secondary | ICD-10-CM | POA: Insufficient documentation

## 2023-01-09 DIAGNOSIS — B0229 Other postherpetic nervous system involvement: Secondary | ICD-10-CM | POA: Diagnosis not present

## 2023-01-09 DIAGNOSIS — Z6838 Body mass index (BMI) 38.0-38.9, adult: Secondary | ICD-10-CM

## 2023-01-09 DIAGNOSIS — E782 Mixed hyperlipidemia: Secondary | ICD-10-CM

## 2023-01-09 DIAGNOSIS — Z1231 Encounter for screening mammogram for malignant neoplasm of breast: Secondary | ICD-10-CM | POA: Diagnosis not present

## 2023-01-09 DIAGNOSIS — F419 Anxiety disorder, unspecified: Secondary | ICD-10-CM

## 2023-01-09 DIAGNOSIS — E034 Atrophy of thyroid (acquired): Secondary | ICD-10-CM | POA: Diagnosis not present

## 2023-01-09 DIAGNOSIS — Z131 Encounter for screening for diabetes mellitus: Secondary | ICD-10-CM

## 2023-01-09 DIAGNOSIS — G43009 Migraine without aura, not intractable, without status migrainosus: Secondary | ICD-10-CM

## 2023-01-09 DIAGNOSIS — F331 Major depressive disorder, recurrent, moderate: Secondary | ICD-10-CM | POA: Insufficient documentation

## 2023-01-09 DIAGNOSIS — F330 Major depressive disorder, recurrent, mild: Secondary | ICD-10-CM

## 2023-01-09 MED ORDER — DULOXETINE HCL 20 MG PO CPEP: 40.0000 mg | ORAL_CAPSULE | Freq: Every day | ORAL | 1 refills | Status: DC

## 2023-01-09 MED ORDER — SUMATRIPTAN SUCCINATE 100 MG PO TABS
50.0000 mg | ORAL_TABLET | ORAL | 1 refills | Status: DC | PRN
Start: 2023-01-09 — End: 2023-10-24

## 2023-01-09 NOTE — Assessment & Plan Note (Signed)
Not currently on cholesterol-lowering medication.  Will get labs today.

## 2023-01-09 NOTE — Assessment & Plan Note (Signed)
Patient currently taking Cymbalta 40 mg daily.  Patient reports she is doing well.

## 2023-01-09 NOTE — Assessment & Plan Note (Signed)
Currently  takes levothyroxine 112 mcg daily.  Will check labs today.

## 2023-01-09 NOTE — Assessment & Plan Note (Signed)
Has about 10 migraines a month.  Patient is seen by neurology was prescribed aimovig but has not started it yet.  Patient does need a refill of her Imitrex.  Refill sent

## 2023-01-09 NOTE — Assessment & Plan Note (Signed)
Blood pressure 122/84.  Patient is not on medication at this time.  She is doing well.

## 2023-01-09 NOTE — Assessment & Plan Note (Signed)
-  Followed by Neurology. °

## 2023-01-09 NOTE — Assessment & Plan Note (Signed)
Continue taking Cymbalta 40 mg daily.

## 2023-01-09 NOTE — Assessment & Plan Note (Signed)
Her weight today is 230 lbs with a BMI of 38.36. she does have comorbidity that include: HTN, hyperlipidemia,migraines,  hypothyroidism, anxiety and depression. Continue working on lifestyle modification.

## 2023-01-10 LAB — LIPID PANEL
Cholesterol: 217 mg/dL — ABNORMAL HIGH (ref <200)
HDL: 45 mg/dL — ABNORMAL LOW (ref >=50)
LDL Cholesterol (Calc): 146 mg/dL (calc) — ABNORMAL HIGH
Non-HDL Cholesterol (Calc): 172 mg/dL (calc) — ABNORMAL HIGH (ref <130)
Total CHOL/HDL Ratio: 4.8 (calc) (ref <5.0)
Triglycerides: 137 mg/dL (ref <150)

## 2023-01-10 LAB — HEMOGLOBIN A1C
Hgb A1c MFr Bld: 6.2 % of total Hgb — ABNORMAL HIGH (ref <5.7)
Mean Plasma Glucose: 131 mg/dL
eAG (mmol/L): 7.3 mmol/L

## 2023-01-10 LAB — COMPLETE METABOLIC PANEL WITH GFR
AG Ratio: 1.9 (calc) (ref 1.0–2.5)
ALT: 11 U/L (ref 6–29)
AST: 13 U/L (ref 10–35)
Albumin: 4.4 g/dL (ref 3.6–5.1)
Alkaline phosphatase (APISO): 78 U/L (ref 37–153)
BUN: 13 mg/dL (ref 7–25)
CO2: 24 mmol/L (ref 20–32)
Calcium: 9.4 mg/dL (ref 8.6–10.4)
Chloride: 108 mmol/L (ref 98–110)
Creat: 0.65 mg/dL (ref 0.50–1.03)
Globulin: 2.3 g/dL (calc) (ref 1.9–3.7)
Glucose, Bld: 118 mg/dL — ABNORMAL HIGH (ref 65–99)
Potassium: 4 mmol/L (ref 3.5–5.3)
Sodium: 142 mmol/L (ref 135–146)
Total Bilirubin: 0.3 mg/dL (ref 0.2–1.2)
Total Protein: 6.7 g/dL (ref 6.1–8.1)
eGFR: 105 mL/min/{1.73_m2} (ref >=60)

## 2023-01-10 LAB — CBC WITH DIFFERENTIAL/PLATELET
Absolute Monocytes: 421 cells/uL (ref 200–950)
Basophils Absolute: 70 cells/uL (ref 0–200)
Basophils Relative: 1.3 %
Eosinophils Absolute: 189 cells/uL (ref 15–500)
Eosinophils Relative: 3.5 %
HCT: 39.5 % (ref 35.0–45.0)
Hemoglobin: 13.4 g/dL (ref 11.7–15.5)
Lymphs Abs: 1912 cells/uL (ref 850–3900)
MCH: 30 pg (ref 27.0–33.0)
MCHC: 33.9 g/dL (ref 32.0–36.0)
MCV: 88.4 fL (ref 80.0–100.0)
MPV: 10.7 fL (ref 7.5–12.5)
Monocytes Relative: 7.8 %
Neutro Abs: 2808 cells/uL (ref 1500–7800)
Neutrophils Relative %: 52 %
Platelets: 240 10*3/uL (ref 140–400)
RBC: 4.47 10*6/uL (ref 3.80–5.10)
RDW: 13.1 % (ref 11.0–15.0)
Total Lymphocyte: 35.4 %
WBC: 5.4 10*3/uL (ref 3.8–10.8)

## 2023-01-10 LAB — HEPATITIS C ANTIBODY: Hepatitis C Ab: NONREACTIVE

## 2023-01-10 LAB — TSH: TSH: 2.01 mIU/L

## 2023-01-14 ENCOUNTER — Telehealth: Payer: Self-pay | Admitting: Neurology

## 2023-01-14 NOTE — Telephone Encounter (Signed)
Pt called stated she has new insurance. [employer] Card:   : Member ID Number: [id number]  Group Number: [id number]   RX Bin: [id number]  RX PCN: [id number] RX GRP: [id number]   2Nd Card: [employer]  Medicare # [id number]
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
## 2023-01-16 ENCOUNTER — Telehealth (INDEPENDENT_AMBULATORY_CARE_PROVIDER_SITE_OTHER): Payer: Medicare Other | Admitting: Neurology

## 2023-01-16 DIAGNOSIS — G43009 Migraine without aura, not intractable, without status migrainosus: Secondary | ICD-10-CM | POA: Diagnosis not present

## 2023-01-16 NOTE — Patient Instructions (Signed)
Great to meet you! We have other options for migraine management if you decide to pursue, please reach out and let me know :)

## 2023-01-28 ENCOUNTER — Encounter: Payer: Self-pay | Admitting: Nurse Practitioner

## 2023-02-06 ENCOUNTER — Ambulatory Visit (HOSPITAL_COMMUNITY): Payer: 59

## 2023-02-06 NOTE — Progress Notes (Unsigned)
Name: Nicole Osborn   MRN: [id number]    DOB: [birth year]   Date:02/07/2023       Progress Note  Subjective  Chief Complaint  Chief Complaint  Patient presents with   Annual Exam    HPI  Patient presents for annual CPE.  Diet: she tries to eat well balanced, some days are better than others Exercise: goal is to walk 5-6 days a weeks, she says most weeks its three days a week Sleep: 8 hours Last dental exam:last month Last eye exam: she says she is due, she will need to schedule  Flowsheet Row Office Visit from 02/07/2023 in [facility]  AUDIT-C Score 0      Depression: Phq 9 is  positive, she is currently taking cymbalta 40 mg daily. She is going to go back in to counseling.     02/07/2023    9:44 AM 01/09/2023    8:39 AM 05/02/2022    9:45 AM 01/23/2022    1:27 PM 09/13/2021    1:30 PM  Depression screen PHQ 2/9  Decreased Interest 2 0 2 2 1   Down, Depressed, Hopeless 2 1 2 1 1   PHQ - 2 Score 4 1 4 3 2   Altered sleeping 2 0 2 0 0  Tired, decreased energy 0 0 1 1 0  Change in appetite 2 0 2 0 0  Feeling bad or failure about yourself  2 0 2 0 0  Trouble concentrating 2 0 2 2 0  Moving slowly or fidgety/restless 0 0 1 0 0  Suicidal thoughts 0 0 0 0 0  PHQ-9 Score 12 1 14 6 2   Difficult doing work/chores Somewhat difficult Not difficult at all Very difficult Somewhat difficult Not difficult at all   Hypertension: BP Readings from Last 3 Encounters:  02/07/23 128/78  01/09/23 122/84  06/11/22 (!) 149/86   Obesity: Wt Readings from Last 3 Encounters:  02/07/23 229 lb 8 oz (104.1 kg)  01/09/23 230 lb 8 oz (104.6 kg)  06/11/22 232 lb 6.4 oz (105.4 kg)   BMI Readings from Last 3 Encounters:  02/07/23 38.19 kg/m  01/09/23 38.36 kg/m  06/11/22 38.67 kg/m     Vaccines:  HPV: up to at age 51 , ask insurance if age between 53-45  Shingrix: 76-64 yo and ask insurance if covered when patient above 43 yo Pneumonia:  educated and discussed  with patient. Flu:  educated and discussed with patient.  Hep C Screening: 01/09/2023 STD testing and prevention (HIV/chl/gon/syphilis): 10/08/2017 Intimate partner violence:none Sexual History : not currently Menstrual History/LMP/Abnormal Bleeding: postmenopausal  Incontinence Symptoms: yes, she says it has gotten better  Breast cancer:  - Last Mammogram: 01/08/2022, she is schedule for mammogram at the end of this month - BRCA gene screening: none  Osteoporosis: Discussed high calcium and vitamin D supplementation, weight bearing exercises  Cervical cancer screening: 04/15/2018, she is due this year, she is going to do this at GYN office  Skin cancer: Discussed monitoring for atypical lesions  Colorectal cancer: 12/11/2019   Lung cancer:   Low Dose CT Chest recommended if Age 10-80 years, 20 pack-year currently smoking OR have quit w/in 15years. Patient does not qualify.   ECG: 10/28/2019  Advanced Care Planning: A voluntary discussion about advance care planning including the explanation and discussion of advance directives.  Discussed health care proxy and Living will, and the patient was able to identify a health care proxy as thinking about it.  Patient  does not have a living will at present time. If patient does have living will, I have requested they bring this to the clinic to be scanned in to their chart.  Lipids: Lab Results  Component Value Date   CHOL 217 (H) 01/09/2023   CHOL 218 (H) 09/13/2021   CHOL 235 (H) 09/05/2020   Lab Results  Component Value Date   HDL 45 (L) 01/09/2023   HDL 40 (L) 09/13/2021   HDL 40 (L) 09/05/2020   Lab Results  Component Value Date   LDLCALC 146 (H) 01/09/2023   LDLCALC 146 (H) 09/13/2021   LDLCALC 148 (H) 09/05/2020   Lab Results  Component Value Date   TRIG 137 01/09/2023   TRIG 184 (H) 09/13/2021   TRIG 287 (H) 09/05/2020   Lab Results  Component Value Date   CHOLHDL 4.8 01/09/2023   CHOLHDL 5.5 (H) 09/13/2021   CHOLHDL  5.9 (H) 09/05/2020   No results found for: "LDLDIRECT"  Glucose: Glucose, Bld  Date Value Ref Range Status  01/09/2023 118 (H) 65 - 99 mg/dL Final    Comment:    .            Fasting reference interval . For someone without known diabetes, a glucose value between 100 and 125 mg/dL is consistent with prediabetes and should be confirmed with a follow-up test. .   01/18/2022 103 (H) 70 - 99 mg/dL Final    Comment:    Glucose reference range applies only to samples taken after fasting for at least 8 hours.  01/18/2022 105 (H) 70 - 99 mg/dL Final    Comment:    Glucose reference range applies only to samples taken after fasting for at least 8 hours.    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   Chronic venous insufficiency 07/15/2018   Lymphedema 07/15/2018   Class 2 severe obesity with serious comorbidity and body mass index (BMI) of 38.0 to 38.9 in adult [facility]) 02/06/2018   Essential hypertension 09/23/2017   Migraine 09/23/2017   Varicose veins of both lower extremities 09/06/2017   Hyperlipidemia 12/23/2016   Anxiety disorder 12/23/2016   Hypothyroidism, unspecified 04/23/2016    Past Surgical History:  Procedure Laterality Date   CHOLECYSTECTOMY N/A 10/08/2017   Procedure: LAPAROSCOPIC CHOLECYSTECTOMY;  Surgeon: Lattie Haw, MD;  Location: [facility] ORS;  Service: General;  Laterality: N/A;   COLONOSCOPY WITH PROPOFOL N/A 12/11/2019   Procedure: COLONOSCOPY WITH PROPOFOL;  Surgeon: Toney Reil, MD;  Location: [facility] ENDOSCOPY;  Service: Gastroenterology;  Laterality: N/A;   Removal of extra tooth     age 16    Family History  Problem Relation Age of Onset   Migraines Mother    Depression Mother    Arthritis Mother    Stroke  Father    Hypertension Father    Migraines Maternal Uncle    Breast cancer Paternal Aunt 49   Emphysema Maternal Grandmother    Emphysema Maternal Grandfather    Dementia Paternal Grandmother    Asthma Son    Eczema Son    Kidney disease Neg Hx    Bladder Cancer Neg Hx     Social History   Socioeconomic History   Marital status: Married    Spouse name: Not on  file   Number of children: 2   Years of education: Not on file   Highest education level: Not on file  Occupational History   Not on file  Tobacco Use   Smoking status: Never   Smokeless tobacco: Never  Vaping Use   Vaping Use: Never used  Substance and Sexual Activity   Alcohol use: No   Drug use: No   Sexual activity: Yes    Birth control/protection: Surgical, Other-see comments    Comment: Husband with vastectomy   Other Topics Concern   Not on file  Social History Narrative   Caffiene; 2 sodas per day   Teacher   Applied disability: migraines/ anxiety/depression/ PHN/ chronic venous insufficiency thru SSD.   Social Determinants of Health   Financial Resource Strain: Low Risk  (02/07/2023)   Overall Financial Resource Strain (CARDIA)    Difficulty of Paying Living Expenses: Not hard at all  Food Insecurity: No Food Insecurity (02/07/2023)   Hunger Vital Sign    Worried About Running Out of Food in the Last Year: Never true    Ran Out of Food in the Last Year: Never true  Transportation Needs: No Transportation Needs (02/07/2023)   PRAPARE - Administrator, Civil Service (Medical): No    Lack of Transportation (Non-Medical): No  Physical Activity: Insufficiently Active (02/07/2023)   Exercise Vital Sign    Days of Exercise per Week: 3 days    Minutes of Exercise per Session: 40 min  Stress: Stress Concern Present (02/07/2023)   [employer] of Occupational Health - Occupational Stress Questionnaire    Feeling of Stress : To some extent  Social Connections: Moderately Integrated  (02/07/2023)   Social Connection and Isolation Panel [NHANES]    Frequency of Communication with Friends and Family: More than three times a week    Frequency of Social Gatherings with Friends and Family: Three times a week    Attends Religious Services: More than 4 times per year    Active Member of Clubs or Organizations: No    Attends Banker Meetings: Never    Marital Status: Married  Catering manager Violence: Not At Risk (02/07/2023)   Humiliation, Afraid, Rape, and Kick questionnaire    Fear of Current or Ex-Partner: No    Emotionally Abused: No    Physically Abused: No    Sexually Abused: No     Current Outpatient Medications:    celecoxib (CELEBREX) 200 MG capsule, Take 1 capsule (200 mg total) by mouth 2 (two) times daily., Disp: 20 capsule, Rfl: 0   DULoxetine (CYMBALTA) 20 MG capsule, Take 2 capsules (40 mg total) by mouth daily., Disp: 180 capsule, Rfl: 1   Erenumab-aooe (AIMOVIG) 140 MG/ML SOAJ, Inject 140 mg into the skin every 30 (thirty) days., Disp: 1 mL, Rfl: 11   levothyroxine (SYNTHROID) 112 MCG tablet, TAKE 1 TABLET BY MOUTH DAILY BEFORE BREAKFAST., Disp: 90 tablet, Rfl: 3   meloxicam (MOBIC) 15 MG tablet, Take 15 mg by mouth daily., Disp: , Rfl:    OREGANO PO, Take 150 capsules by mouth daily as needed (immune support)., Disp: , Rfl:    SUMAtriptan (IMITREX) 100 MG tablet, Take 0.5-1 tablets (50-100 mg total) by mouth every 2 (two) hours as needed for migraine. May repeat in 2 hours if headache persists or recurs., Disp: 30 tablet, Rfl: 1   tiZANidine (ZANAFLEX) 4 MG tablet, Take 4 mg by mouth 4 (four) times daily., Disp: , Rfl:   Allergies  Allergen Reactions   Wellbutrin [Bupropion] Other (See Comments)    Reaction - "insects crawling in my head"   Cefaclor Swelling    Lips swelling    Peanut-Containing Drug Products      ROS  Constitutional: Negative for fever or weight change.  Respiratory: Negative for cough and shortness of breath.    Cardiovascular: Negative for chest pain or palpitations.  Gastrointestinal: Negative for abdominal pain, no bowel changes.  Musculoskeletal: Negative for gait problem or joint swelling.  Skin: Negative for rash.  Neurological: Negative for dizziness or headache.  No other specific complaints in a complete review of systems (except as listed in HPI above).   Objective  Vitals:   02/07/23 0942  BP: 128/78  Pulse: 93  Resp: 18  Temp: 97.6 F (36.4 C)  TempSrc: Oral  SpO2: 95%  Weight: 229 lb 8 oz (104.1 kg)  Height: 5\' 5"  (1.651 m)    Body mass index is 38.19 kg/m.  Physical Exam Constitutional: Patient appears well-developed and well-nourished. No distress.  HENT: Head: Normocephalic and atraumatic. Ears: B TMs ok, no erythema or effusion; Nose: Nose normal. Mouth/Throat: Oropharynx is clear and moist. No oropharyngeal exudate.  Eyes: Conjunctivae and EOM are normal. Pupils are equal, round, and reactive to light. No scleral icterus.  Neck: Normal range of motion. Neck supple. No JVD present. No thyromegaly present.  Cardiovascular: Normal rate, regular rhythm and normal heart sounds.  No murmur heard. No BLE edema. Pulmonary/Chest: Effort normal and breath sounds normal. No respiratory distress. Abdominal: Soft. Bowel sounds are normal, no distension. There is no tenderness. no masses Breast: no lumps or masses, no nipple discharge or rashes Musculoskeletal: Normal range of motion, no joint effusions. No gross deformities Neurological: he is alert and oriented to person, place, and time. No cranial nerve deficit. Coordination, balance, strength, speech and gait are normal.  Skin: Skin is warm and dry. No rash noted. No erythema.  Psychiatric: Patient has a normal mood and affect. behavior is normal. Judgment and thought content normal.   Recent Results (from the past 2160 hour(s))  CBC with Differential/Platelet     Status: None   Collection Time: 01/09/23  9:05 AM  Result  Value Ref Range   WBC 5.4 3.8 - 10.8 Thousand/uL   RBC 4.47 3.80 - 5.10 Million/uL   Hemoglobin 13.4 11.7 - 15.5 g/dL   HCT 30.8 65.7 - 84.6 %   MCV 88.4 80.0 - 100.0 fL   MCH 30.0 27.0 - 33.0 pg   MCHC 33.9 32.0 - 36.0 g/dL   RDW 96.2 95.2 - 84.1 %   Platelets 240 140 - 400 Thousand/uL   MPV 10.7 7.5 - 12.5 fL   Neutro Abs 2,808 1,500 - 7,800 cells/uL   Lymphs Abs 1,912 850 - 3,900 cells/uL   Absolute Monocytes 421 200 - 950 cells/uL   Eosinophils Absolute 189 15 - 500 cells/uL   Basophils Absolute 70 0 - 200 cells/uL   Neutrophils Relative % 52 %   Total Lymphocyte 35.4 %   Monocytes Relative 7.8 %   Eosinophils Relative 3.5 %   Basophils Relative 1.3 %  COMPLETE METABOLIC PANEL WITH GFR     Status: Abnormal   Collection Time: 01/09/23  9:05 AM  Result Value Ref Range   Glucose, Bld 118 (H) 65 - 99 mg/dL    Comment: .            Fasting reference interval . For someone without known diabetes, a  glucose value between 100 and 125 mg/dL is consistent with prediabetes and should be confirmed with a follow-up test. .    BUN 13 7 - 25 mg/dL   Creat 0.98 1.19 - 1.47 mg/dL   eGFR 829 > OR = 60 FA/OZH/0.86V7   BUN/Creatinine Ratio SEE NOTE: 6 - 22 (calc)    Comment:    Not Reported: BUN and Creatinine are within    reference range. .    Sodium 142 135 - 146 mmol/L   Potassium 4.0 3.5 - 5.3 mmol/L   Chloride 108 98 - 110 mmol/L   CO2 24 20 - 32 mmol/L   Calcium 9.4 8.6 - 10.4 mg/dL   Total Protein 6.7 6.1 - 8.1 g/dL   Albumin 4.4 3.6 - 5.1 g/dL   Globulin 2.3 1.9 - 3.7 g/dL (calc)   AG Ratio 1.9 1.0 - 2.5 (calc)   Total Bilirubin 0.3 0.2 - 1.2 mg/dL   Alkaline phosphatase (APISO) 78 37 - 153 U/L   AST 13 10 - 35 U/L   ALT 11 6 - 29 U/L  Lipid panel     Status: Abnormal   Collection Time: 01/09/23  9:05 AM  Result Value Ref Range   Cholesterol 217 (H) <200 mg/dL   HDL 45 (L) > OR = 50 mg/dL   Triglycerides 846 <962 mg/dL   LDL Cholesterol (Calc) 146 (H) mg/dL  (calc)    Comment: Reference range: <100 . Desirable range <100 mg/dL for primary prevention;   <70 mg/dL for patients with CHD or diabetic patients  with > or = 2 CHD risk factors. Marland Kitchen LDL-C is now calculated using the Martin-Hopkins  calculation, which is a validated novel method providing  better accuracy than the Friedewald equation in the  estimation of LDL-C.  Horald Pollen et al. Lenox Ahr. 9528;413(24): 2061-2068  (http://education.[facility].com/faq/FAQ164)    Total CHOL/HDL Ratio 4.8 <5.0 (calc)   Non-HDL Cholesterol (Calc) 172 (H) <130 mg/dL (calc)    Comment: For patients with diabetes plus 1 major ASCVD risk  factor, treating to a non-HDL-C goal of <100 mg/dL  (LDL-C of <40 mg/dL) is considered a therapeutic  option.   Hemoglobin A1c     Status: Abnormal   Collection Time: 01/09/23  9:05 AM  Result Value Ref Range   Hgb A1c MFr Bld 6.2 (H) <5.7 % of total Hgb    Comment: For someone without known diabetes, a hemoglobin  A1c value between 5.7% and 6.4% is consistent with prediabetes and should be confirmed with a  follow-up test. . For someone with known diabetes, a value <7% indicates that their diabetes is well controlled. A1c targets should be individualized based on duration of diabetes, age, comorbid conditions, and other considerations. . This assay result is consistent with an increased risk of diabetes. . Currently, no consensus exists regarding use of hemoglobin A1c for diagnosis of diabetes for children. .    Mean Plasma Glucose 131 mg/dL   eAG (mmol/L) 7.3 mmol/L    Comment: . This test was performed on the Roche cobas c503 platform. Effective 07/02/22, a change in test platforms from the Abbott Architect to the Roche cobas c503 may have shifted HbA1c results compared to historical results. Based on laboratory validation testing conducted at [facility], the Roche platform relative to the Abbott platform had an average increase in HbA1c value of < or =  0.3%. This difference is within accepted  variability established by the [facility]. Note that not all individuals  will have had a shift in their results and direct comparisons between historical and current results for testing conducted on different platforms is not recommended.   TSH     Status: None   Collection Time: 01/09/23  9:05 AM  Result Value Ref Range   TSH 2.01 mIU/L    Comment:           Reference Range .           > or = 20 Years  0.40-4.50 .                Pregnancy Ranges           First trimester    0.26-2.66           Second trimester   0.55-2.73           Third trimester    0.43-2.91   Hepatitis C antibody     Status: None   Collection Time: 01/09/23  9:05 AM  Result Value Ref Range   Hepatitis C Ab NON-REACTIVE NON-REACTIVE    Comment: . HCV antibody was non-reactive. There is no laboratory  evidence of HCV infection. . In most cases, no further action is required. However, if recent HCV exposure is suspected, a test for HCV RNA (test code 16109) is suggested. . For additional information please refer to http://education.[facility].com/faq/FAQ22v1 (This link is being provided for informational/ educational purposes only.) .      Fall Risk:    02/07/2023    9:44 AM 01/09/2023    8:40 AM 05/02/2022    9:41 AM 01/23/2022    1:27 PM 09/13/2021    1:30 PM  Fall Risk   Falls in the past year? 0 0 0 0 1  Number falls in past yr: 0 0 0 0 1  Injury with Fall? 0 0 0 0 0  Risk for fall due to :     History of fall(s)  Follow up   Falls evaluation completed Falls evaluation completed Falls evaluation completed    Functional Status Survey: Is the patient deaf or have difficulty hearing?: No Does the patient have difficulty seeing, even when wearing glasses/contacts?: Yes Does the patient have difficulty concentrating, remembering, or making decisions?: Yes Does the patient have difficulty walking or climbing  stairs?: Yes Does the patient have difficulty dressing or bathing?: No Does the patient have difficulty doing errands alone such as visiting a doctor's office or shopping?: No   Assessment & Plan  1. Annual physical exam Labs recently done Increase physical activity to 150 min a week Keep appointment for mammogram Scheduled for pap in July with Dr. Valentino Saxon.   -USPSTF grade A and B recommendations reviewed with patient; age-appropriate recommendations, preventive care, screening tests, etc discussed and encouraged; healthy living encouraged; see AVS for patient education given to patient -Discussed importance of 150 minutes of physical activity weekly, eat two servings of fish weekly, eat one serving of tree nuts ( cashews, pistachios, pecans, almonds.Marland Kitchen) every other day, eat 6 servings of fruit/vegetables daily and drink plenty of water and avoid sweet beverages.

## 2023-02-07 ENCOUNTER — Other Ambulatory Visit: Payer: Self-pay

## 2023-02-07 ENCOUNTER — Ambulatory Visit (INDEPENDENT_AMBULATORY_CARE_PROVIDER_SITE_OTHER): Payer: Medicare Other | Admitting: Nurse Practitioner

## 2023-02-07 ENCOUNTER — Encounter: Payer: Self-pay | Admitting: Nurse Practitioner

## 2023-02-07 VITALS — BP 128/78 | HR 93 | Temp 97.6°F | Resp 18 | Ht 65.0 in | Wt 229.5 lb

## 2023-02-07 DIAGNOSIS — Z0000 Encounter for general adult medical examination without abnormal findings: Secondary | ICD-10-CM | POA: Diagnosis not present

## 2023-02-21 ENCOUNTER — Ambulatory Visit
Admission: RE | Admit: 2023-02-21 | Discharge: 2023-02-21 | Disposition: A | Discharge status: Home / Self Care | Payer: Medicare Other | Source: Ambulatory Visit | Attending: Nurse Practitioner | Admitting: Nurse Practitioner

## 2023-02-21 DIAGNOSIS — Z1231 Encounter for screening mammogram for malignant neoplasm of breast: Secondary | ICD-10-CM | POA: Diagnosis not present

## 2023-03-03 ENCOUNTER — Other Ambulatory Visit: Payer: Self-pay | Admitting: Nurse Practitioner

## 2023-03-03 DIAGNOSIS — E034 Atrophy of thyroid (acquired): Secondary | ICD-10-CM
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
## 2023-03-07 ENCOUNTER — Ambulatory Visit (INDEPENDENT_AMBULATORY_CARE_PROVIDER_SITE_OTHER): Payer: Medicare Other

## 2023-03-07 VITALS — BP 122/74 | Ht 65.0 in | Wt 229.4 lb

## 2023-03-07 DIAGNOSIS — Z0000 Encounter for general adult medical examination without abnormal findings: Secondary | ICD-10-CM | POA: Diagnosis not present

## 2023-03-07 DIAGNOSIS — Z0100 Encounter for examination of eyes and vision without abnormal findings: Secondary | ICD-10-CM

## 2023-03-07 NOTE — Patient Instructions (Signed)
Ms. Balsam , Thank you for taking time to come for your Medicare Wellness Visit. I appreciate your ongoing commitment to your health goals. Please review the following plan we discussed and let me know if I can assist you in the future.   These are the goals we discussed:  Goals   None     This is a list of the screening recommended for you and due dates:  Health Maintenance  Topic Date Due   COVID-19 Vaccine (3 - 2023-24 season) 05/25/2022   Pap Smear  04/16/2023   Zoster (Shingles) Vaccine (1 of 2) 04/10/2023*   Mammogram  02/21/2024   Medicare Annual Wellness Visit  03/06/2024   Colon Cancer Screening  12/10/2029   DTaP/Tdap/Td vaccine (2 - Td or Tdap) 01/19/2032   Hepatitis C Screening  Completed   HIV Screening  Completed   HPV Vaccine  Aged Out   Flu Shot  Discontinued  *Topic was postponed. The date shown is not the original due date.    Advanced directives: no  Conditions/risks identified: low falls risk  Next appointment: Follow up in one year for your annual wellness visit. 03/12/2024 @11 :45am in person  Preventive Care 40-64 Years, Female Preventive care refers to lifestyle choices and visits with your health care provider that can promote health and wellness. What does preventive care include? A yearly physical exam. This is also called an annual well check. Dental exams once or twice a year. Routine eye exams. Ask your health care provider how often you should have your eyes checked. Personal lifestyle choices, including: Daily care of your teeth and gums. Regular physical activity. Eating a healthy diet. Avoiding tobacco and drug use. Limiting alcohol use. Practicing safe sex. Taking low-dose aspirin daily starting at age 48. Taking vitamin and mineral supplements as recommended by your health care provider. What happens during an annual well check? The services and screenings done by your health care provider during your annual well check will depend on  your age, overall health, lifestyle risk factors, and family history of disease. Counseling  Your health care provider may ask you questions about your: Alcohol use. Tobacco use. Drug use. Emotional well-being. Home and relationship well-being. Sexual activity. Eating habits. Work and work Astronomer. Method of birth control. Menstrual cycle. Pregnancy history. Screening  You may have the following tests or measurements: Height, weight, and BMI. Blood pressure. Lipid and cholesterol levels. These may be checked every 5 years, or more frequently if you are over 18 years old. Skin check. Lung cancer screening. You may have this screening every year starting at age 47 if you have a 30-pack-year history of smoking and currently smoke or have quit within the past 15 years. Fecal occult blood test (FOBT) of the stool. You may have this test every year starting at age 52. Flexible sigmoidoscopy or colonoscopy. You may have a sigmoidoscopy every 5 years or a colonoscopy every 10 years starting at age 47. Hepatitis C blood test. Hepatitis B blood test. Sexually transmitted disease (STD) testing. Diabetes screening. This is done by checking your blood sugar (glucose) after you have not eaten for a while (fasting). You may have this done every 1-3 years. Mammogram. This may be done every 1-2 years. Talk to your health care provider about when you should start having regular mammograms. This may depend on whether you have a family history of breast cancer. BRCA-related cancer screening. This may be done if you have a family history of breast, ovarian, tubal,  or peritoneal cancers. Pelvic exam and Pap test. This may be done every 3 years starting at age 39. Starting at age 8, this may be done every 5 years if you have a Pap test in combination with an HPV test. Bone density scan. This is done to screen for osteoporosis. You may have this scan if you are at high risk for osteoporosis. Discuss your  test results, treatment options, and if necessary, the need for more tests with your health care provider. Vaccines  Your health care provider may recommend certain vaccines, such as: Influenza vaccine. This is recommended every year. Tetanus, diphtheria, and acellular pertussis (Tdap, Td) vaccine. You may need a Td booster every 10 years. Zoster vaccine. You may need this after age 15. Pneumococcal 13-valent conjugate (PCV13) vaccine. You may need this if you have certain conditions and were not previously vaccinated. Pneumococcal polysaccharide (PPSV23) vaccine. You may need one or two doses if you smoke cigarettes or if you have certain conditions. Talk to your health care provider about which screenings and vaccines you need and how often you need them. This information is not intended to replace advice given to you by your health care provider. Make sure you discuss any questions you have with your health care provider. Document Released: 10/07/2015 Document Revised: 05/30/2016 Document Reviewed: 07/12/2015 Elsevier Interactive Patient Education  2017 [employer].    Fall Prevention in the Home Falls can cause injuries. They can happen to people of all ages. There are many things you can do to make your home safe and to help prevent falls. What can I do on the outside of my home? Regularly fix the edges of walkways and driveways and fix any cracks. Remove anything that might make you trip as you walk through a door, such as a raised step or threshold. Trim any bushes or trees on the path to your home. Use bright outdoor lighting. Clear any walking paths of anything that might make someone trip, such as rocks or tools. Regularly check to see if handrails are loose or broken. Make sure that both sides of any steps have handrails. Any raised decks and porches should have guardrails on the edges. Have any leaves, snow, or ice cleared regularly. Use sand or salt on walking paths during  winter. Clean up any spills in your garage right away. This includes oil or grease spills. What can I do in the bathroom? Use night lights. Install grab bars by the toilet and in the tub and shower. Do not use towel bars as grab bars. Use non-skid mats or decals in the tub or shower. If you need to sit down in the shower, use a plastic, non-slip stool. Keep the floor dry. Clean up any water that spills on the floor as soon as it happens. Remove soap buildup in the tub or shower regularly. Attach bath mats securely with double-sided non-slip rug tape. Do not have throw rugs and other things on the floor that can make you trip. What can I do in the bedroom? Use night lights. Make sure that you have a light by your bed that is easy to reach. Do not use any sheets or blankets that are too big for your bed. They should not hang down onto the floor. Have a firm chair that has side arms. You can use this for support while you get dressed. Do not have throw rugs and other things on the floor that can make you trip. What can I do  in the kitchen? Clean up any spills right away. Avoid walking on wet floors. Keep items that you use a lot in easy-to-reach places. If you need to reach something above you, use a strong step stool that has a grab bar. Keep electrical cords out of the way. Do not use floor polish or wax that makes floors slippery. If you must use wax, use non-skid floor wax. Do not have throw rugs and other things on the floor that can make you trip. What can I do with my stairs? Do not leave any items on the stairs. Make sure that there are handrails on both sides of the stairs and use them. Fix handrails that are broken or loose. Make sure that handrails are as long as the stairways. Check any carpeting to make sure that it is firmly attached to the stairs. Fix any carpet that is loose or worn. Avoid having throw rugs at the top or bottom of the stairs. If you do have throw rugs,  attach them to the floor with carpet tape. Make sure that you have a light switch at the top of the stairs and the bottom of the stairs. If you do not have them, ask someone to add them for you. What else can I do to help prevent falls? Wear shoes that: Do not have high heels. Have rubber bottoms. Are comfortable and fit you well. Are closed at the toe. Do not wear sandals. If you use a stepladder: Make sure that it is fully opened. Do not climb a closed stepladder. Make sure that both sides of the stepladder are locked into place. Ask someone to hold it for you, if possible. Clearly mark and make sure that you can see: Any grab bars or handrails. First and last steps. Where the edge of each step is. Use tools that help you move around (mobility aids) if they are needed. These include: Canes. Walkers. Scooters. Crutches. Turn on the lights when you go into a dark area. Replace any light bulbs as soon as they burn out. Set up your furniture so you have a clear path. Avoid moving your furniture around. If any of your floors are uneven, fix them. If there are any pets around you, be aware of where they are. Review your medicines with your doctor. Some medicines can make you feel dizzy. This can increase your chance of falling. Ask your doctor what other things that you can do to help prevent falls. This information is not intended to replace advice given to you by your health care provider. Make sure you discuss any questions you have with your health care provider. Document Released: 07/07/2009 Document Revised: 02/16/2016 Document Reviewed: 10/15/2014 Elsevier Interactive Patient Education  2017 [employer].

## 2023-03-07 NOTE — Progress Notes (Signed)
Subjective:   Nicole Osborn is a 52 y.o. female who presents for an Initial Medicare Annual Wellness Visit.  Review of Systems    Cardiac Risk Factors include: dyslipidemia;hypertension;obesity (BMI >30kg/m2)    Objective:    Today's Vitals   03/07/23 0840  BP: 122/74  Weight: 229 lb 6.4 oz (104.1 kg)  Height: 5\' 5"  (1.651 m)  PainSc: 4    Body mass index is 38.17 kg/m.     03/07/2023    9:03 AM 07/19/2022   10:23 AM 01/18/2022   10:47 AM 12/11/2019    9:20 AM 10/27/2019    6:55 PM 10/08/2017    3:58 PM 10/01/2017    4:48 PM  Advanced Directives  Does Patient Have a Medical Advance Directive? No No No No No No No  Would patient like information on creating a medical advance directive?  No - Patient declined No - Patient declined  No - Patient declined No - Patient declined     Current Medications (verified) Outpatient Encounter Medications as of 03/07/2023  Medication Sig   celecoxib (CELEBREX) 200 MG capsule Take 1 capsule (200 mg total) by mouth 2 (two) times daily.   DULoxetine (CYMBALTA) 20 MG capsule Take 2 capsules (40 mg total) by mouth daily.   Erenumab-aooe (AIMOVIG) 140 MG/ML SOAJ Inject 140 mg into the skin every 30 (thirty) days.   levothyroxine (SYNTHROID) 112 MCG tablet TAKE 1 TABLET BY MOUTH EVERY DAY BEFORE BREAKFAST   meloxicam (MOBIC) 15 MG tablet Take 15 mg by mouth daily.   OREGANO PO Take 150 capsules by mouth daily as needed (immune support).   SUMAtriptan (IMITREX) 100 MG tablet Take 0.5-1 tablets (50-100 mg total) by mouth every 2 (two) hours as needed for migraine. May repeat in 2 hours if headache persists or recurs.   tiZANidine (ZANAFLEX) 4 MG tablet Take 4 mg by mouth 4 (four) times daily.   No facility-administered encounter medications on file as of 03/07/2023.    Allergies (verified) Wellbutrin [bupropion], Cefaclor, and Peanut-containing drug products   History: Past Medical History:  Diagnosis Date   Anxiety    Arthritis     Cholecystitis with cholelithiasis 09/19/2017   Chronic venous insufficiency of lower extremity    Depression    Elevated blood pressure reading    no meds   GERD (gastroesophageal reflux disease)    occ   Heartburn    History of kidney stones    h/o   Hyperlipidemia    Hypothyroidism    Migraine 09/23/2017   Migraines    without aura   Obesity    Thyroid disease    Varicose veins of both lower extremities 09/06/2017   Past Surgical History:  Procedure Laterality Date   CHOLECYSTECTOMY N/A 10/08/2017   Procedure: LAPAROSCOPIC CHOLECYSTECTOMY;  Surgeon: Lattie Haw, MD;  Location: [facility] ORS;  Service: General;  Laterality: N/A;   COLONOSCOPY WITH PROPOFOL N/A 12/11/2019   Procedure: COLONOSCOPY WITH PROPOFOL;  Surgeon: Toney Reil, MD;  Location: [facility] ENDOSCOPY;  Service: Gastroenterology;  Laterality: N/A;   Removal of extra tooth     age 11   Family History  Problem Relation Age of Onset   Migraines Mother    Depression Mother    Arthritis Mother    Stroke Father    Hypertension Father    Migraines Maternal Uncle    Breast cancer Paternal Aunt 15   Emphysema Maternal Grandmother    Emphysema Maternal Grandfather    Dementia  Paternal Grandmother    Asthma Son    Eczema Son    Kidney disease Neg Hx    Bladder Cancer Neg Hx    Social History   Socioeconomic History   Marital status: Married    Spouse name: Not on file   Number of children: 2   Years of education: Not on file   Highest education level: Not on file  Occupational History   Not on file  Tobacco Use   Smoking status: Never   Smokeless tobacco: Never  Vaping Use   Vaping Use: Never used  Substance and Sexual Activity   Alcohol use: No   Drug use: No   Sexual activity: Yes    Birth control/protection: Surgical, Other-see comments    Comment: Husband with vastectomy   Other Topics Concern   Not on file  Social History Narrative   Caffiene; 2 sodas per day   Teacher   Applied  disability: migraines/ anxiety/depression/ PHN/ chronic venous insufficiency thru SSD.   Social Determinants of Health   Financial Resource Strain: Low Risk  (03/07/2023)   Overall Financial Resource Strain (CARDIA)    Difficulty of Paying Living Expenses: Not hard at all  Food Insecurity: No Food Insecurity (03/07/2023)   Hunger Vital Sign    Worried About Running Out of Food in the Last Year: Never true    Ran Out of Food in the Last Year: Never true  Transportation Needs: No Transportation Needs (03/07/2023)   PRAPARE - Administrator, Civil Service (Medical): No    Lack of Transportation (Non-Medical): No  Physical Activity: Insufficiently Active (03/07/2023)   Exercise Vital Sign    Days of Exercise per Week: 3 days    Minutes of Exercise per Session: 40 min  Stress: Stress Concern Present (03/07/2023)   [employer] of Occupational Health - Occupational Stress Questionnaire    Feeling of Stress : To some extent  Social Connections: Moderately Integrated (03/07/2023)   Social Connection and Isolation Panel [NHANES]    Frequency of Communication with Friends and Family: More than three times a week    Frequency of Social Gatherings with Friends and Family: Three times a week    Attends Religious Services: More than 4 times per year    Active Member of Clubs or Organizations: No    Attends Banker Meetings: Never    Marital Status: Married    Tobacco Counseling Counseling given: Not Answered   Clinical Intake:  Pre-visit preparation completed: Yes  Pain : 0-10 Pain Score: 4  Pain Type: Chronic pain Pain Location: Hand (thumbs;headache at 2; lower back at 4) Pain Descriptors / Indicators: Aching Pain Onset: More than a month ago Pain Frequency: Intermittent Pain Relieving Factors: resting, heat,IBU  Pain Relieving Factors: resting, heat,IBU  BMI - recorded: 38.17 Nutritional Status: BMI > 30  Obese Nutritional Risks: Nausea/ vomitting/  diarrhea (n/v sometimes with migraines) Diabetes: No  How often do you need to have someone help you when you read instructions, pamphlets, or other written materials from your doctor or pharmacy?: 1 - Never  Diabetic?no  Interpreter Needed?: No  Comments: lives with husband Information entered by :: B.Izzie Geers,LPN   Activities of Daily Living    03/07/2023    9:03 AM 02/07/2023    9:44 AM  In your present state of health, do you have any difficulty performing the following activities:  Hearing? 0 0  Vision? 1 1  Difficulty concentrating or making decisions? 1  1  Walking or climbing stairs? 1 1  Dressing or bathing? 0 0  Doing errands, shopping? 0 0  Preparing Food and eating ? N   Using the Toilet? N   In the past six months, have you accidently leaked urine? Y   Do you have problems with loss of bowel control? Y   Managing your Medications? N   Managing your Finances? N   Housekeeping or managing your Housekeeping? Y     Patient Care Team: Berniece Salines, FNP as PCP - General (Nurse Practitioner) Aida Raider, MD as Referring Physician  Indicate any recent Medical Services you may have received from other than [facility] providers in the past year (date may be approximate).     Assessment:   This is a routine wellness examination for Mayetta.  Hearing/Vision screen Hearing Screening - Comments:: Adequate hearing  Vision Screening - Comments:: Adequate vision with glasses/readers Does not have eye provider since insurance changed. Referral to Dr Clydene Pugh made    Dietary issues and exercise activities discussed: Current Exercise Habits: Home exercise routine, Type of exercise: walking, Time (Minutes): 40, Frequency (Times/Week): 3, Weekly Exercise (Minutes/Week): 120, Intensity: Mild, Exercise limited by: orthopedic condition(s);psychological condition(s);neurologic condition(s)   Goals Addressed   None    Depression Screen    03/07/2023    8:54 AM 02/07/2023     9:44 AM 01/09/2023    8:39 AM 05/02/2022    9:45 AM 01/23/2022    1:27 PM 09/13/2021    1:30 PM 02/08/2021    3:21 PM  PHQ 2/9 Scores  PHQ - 2 Score 3 4 1 4 3 2 3   PHQ- 9 Score 8 12 1 14 6 2 17     Fall Risk    03/07/2023    8:50 AM 02/07/2023    9:44 AM 01/09/2023    8:40 AM 05/02/2022    9:41 AM 01/23/2022    1:27 PM  Fall Risk   Falls in the past year? 0 0 0 0 0  Number falls in past yr: 0 0 0 0 0  Injury with Fall? 0 0 0 0 0  Risk for fall due to : No Fall Risks      Follow up Education provided;Falls prevention discussed   Falls evaluation completed Falls evaluation completed    FALL RISK PREVENTION PERTAINING TO THE HOME:  Any stairs in or around the home? Yes  If so, are there any without handrails? Yes  Home free of loose throw rugs in walkways, pet beds, electrical cords, etc? Yes  Adequate lighting in your home to reduce risk of falls? Yes   ASSISTIVE DEVICES UTILIZED TO PREVENT FALLS:  Life alert? No  Use of a cane, walker or w/c? No  Grab bars in the bathroom? No  Shower chair or bench in shower? No  Elevated toilet seat or a handicapped toilet? No   TIMED UP AND GO:  Was the test performed? Yes .  Length of time to ambulate 10 feet: 10 sec.   Gait slow and steady without use of assistive device  Cognitive Function:        03/07/2023    9:11 AM  6CIT Screen  What Year? 0 points  What month? 0 points  What time? 0 points  Count back from 20 0 points  Months in reverse 0 points  Repeat phrase 0 points  Total Score 0 points    Immunizations Immunization History  Administered Date(s) Administered   Influenza,inj,Quad  PF,6+ Mos 07/02/2019   PFIZER(Purple Top)SARS-COV-2 Vaccination 11/24/2019, 12/15/2019   Tdap 01/18/2022    TDAP status: Up to date  Flu Vaccine status: Declined, Education has been provided regarding the importance of this vaccine but patient still declined. Advised may receive this vaccine at local pharmacy or Health Dept. Aware to  provide a copy of the vaccination record if obtained from local pharmacy or Health Dept. Verbalized acceptance and understanding.   Covid-19 vaccine status: Completed vaccines  Qualifies for Shingles Vaccine? No    Screening Tests Health Maintenance  Topic Date Due   COVID-19 Vaccine (3 - 2023-24 season) 05/25/2022   PAP SMEAR-Modifier  04/16/2023   Zoster Vaccines- Shingrix (1 of 2) 04/10/2023 (Originally 09/12/2019)   MAMMOGRAM  02/21/2024   Medicare Annual Wellness (AWV)  03/06/2024   Colonoscopy  12/10/2029   DTaP/Tdap/Td (2 - Td or Tdap) 01/19/2032   Hepatitis C Screening  Completed   HIV Screening  Completed   HPV VACCINES  Aged Out   INFLUENZA VACCINE  Discontinued    Health Maintenance  Health Maintenance Due  Topic Date Due   COVID-19 Vaccine (3 - 2023-24 season) 05/25/2022   PAP SMEAR-Modifier  04/16/2023    Colorectal cancer screening: Type of screening: Colonoscopy. Completed yes. Repeat every 10 years  Mammogram status: Completed yes. Repeat every year  Lung Cancer Screening: (Low Dose CT Chest recommended if Age 37-80 years, 30 pack-year currently smoking OR have quit w/in 15years.) does not qualify.   Lung Cancer Screening Referral: no  Additional Screening:  Hepatitis C Screening: does not qualify; Completed yes  Vision Screening: Recommended annual ophthalmology exams for early detection of glaucoma and other disorders of the eye. Is the patient up to date with their annual eye exam?  Yes  Who is the provider or what is the name of the office in which the patient attends annual eye exams? none If pt is not established with a provider, would they like to be referred to a provider to establish care? Yes .   Dental Screening: Recommended annual dental exams for proper oral hygiene  Community Resource Referral / Chronic Care Management: CRR required this visit?  No   CCM required this visit?  No      Plan:     I have personally reviewed and  noted the following in the patient's chart:   Medical and social history Use of alcohol, tobacco or illicit drugs  Current medications and supplements including opioid prescriptions. Patient is not currently taking opioid prescriptions. Functional ability and status Nutritional status Physical activity Advanced directives List of other physicians Hospitalizations, surgeries, and ER visits in previous 12 months Vitals Screenings to include cognitive, depression, and falls Referrals and appointments  In addition, I have reviewed and discussed with patient certain preventive protocols, quality metrics, and best practice recommendations. A written personalized care plan for preventive services as well as general preventive health recommendations were provided to patient.     Sue Lush, LPN   1/61/0960   Nurse Notes: Pt recently disabled and has problems with depression and sleeping. She is on medication and was seeing a therapist until her insurance changed and no longer in network. Pt says she was given a list of therapist and will reach out to establish with one. She also reports experiencing 10-15 migraines per month which limits her desired activities.  Pt has no concerns or questions at this time.

## 2023-03-27 DIAGNOSIS — L821 Other seborrheic keratosis: Secondary | ICD-10-CM | POA: Diagnosis not present

## 2023-03-27 DIAGNOSIS — L570 Actinic keratosis: Secondary | ICD-10-CM | POA: Diagnosis not present

## 2023-04-10 DIAGNOSIS — M545 Low back pain, unspecified: Secondary | ICD-10-CM | POA: Diagnosis not present

## 2023-04-10 DIAGNOSIS — M542 Cervicalgia: Secondary | ICD-10-CM | POA: Diagnosis not present

## 2023-04-18 ENCOUNTER — Ambulatory Visit (INDEPENDENT_AMBULATORY_CARE_PROVIDER_SITE_OTHER): Payer: Medicare Other | Admitting: Vascular Surgery

## 2023-04-22 ENCOUNTER — Telehealth: Payer: Self-pay | Admitting: Nurse Practitioner

## 2023-04-22 NOTE — Telephone Encounter (Signed)
Pt is calling Helen's call regarding questions for completing forms CB- [phone]

## 2023-04-24 NOTE — Telephone Encounter (Signed)
Patient notifeed to pick up paperwork

## 2023-05-02 ENCOUNTER — Ambulatory Visit (INDEPENDENT_AMBULATORY_CARE_PROVIDER_SITE_OTHER): Payer: Medicare Other | Admitting: Vascular Surgery

## 2023-05-07 ENCOUNTER — Telehealth: Payer: Self-pay | Admitting: *Deleted

## 2023-05-07 NOTE — Patient Outreach (Signed)
  Care Coordination   05/07/2023 Name: Nicole Osborn MRN: [id number] DOB: 01-29-1971   Care Coordination Outreach Attempts:  An unsuccessful telephone outreach was attempted today to offer the patient information about available care coordination services.  Follow Up Plan:  Additional outreach attempts will be made to offer the patient care coordination information and services.   Encounter Outcome:  No Answer   Care Coordination Interventions:  No, not indicated    Kemper Durie, RN, MSN, [facility] Care Management Care Management Coordinator [phone]

## 2023-05-10 ENCOUNTER — Telehealth: Payer: Self-pay | Admitting: *Deleted

## 2023-05-10 NOTE — Patient Outreach (Signed)
  Care Coordination   05/10/2023 Name: Nicole Osborn MRN: [id number] DOB: 1971/03/19   Care Coordination Outreach Attempts:  A second unsuccessful outreach was attempted today to offer the patient with information about available care coordination services.  Follow Up Plan:  Additional outreach attempts will be made to offer the patient care coordination information and services.   Encounter Outcome:  No Answer   Care Coordination Interventions:  No, not indicated    Kemper Durie, RN, MSN, [facility] Care Management Care Management Coordinator [phone]

## 2023-05-15 ENCOUNTER — Telehealth: Payer: Self-pay | Admitting: *Deleted

## 2023-05-15 NOTE — Patient Outreach (Signed)
  Care Coordination   05/15/2023 Name: Nicole Osborn MRN: [id number] DOB: 1970-12-16   Care Coordination Outreach Attempts:  A third unsuccessful outreach was attempted today to offer the patient with information about available care coordination services.  Follow Up Plan:  No further outreach attempts will be made at this time. We have been unable to contact the patient to offer or enroll patient in care coordination services  Encounter Outcome:  No Answer   Care Coordination Interventions:  No, not indicated    Kemper Durie, RN, MSN, [facility] Care Management Care Management Coordinator [phone]

## 2023-05-16 ENCOUNTER — Ambulatory Visit (INDEPENDENT_AMBULATORY_CARE_PROVIDER_SITE_OTHER): Payer: Medicare Other | Admitting: Vascular Surgery

## 2023-05-16 ENCOUNTER — Encounter (INDEPENDENT_AMBULATORY_CARE_PROVIDER_SITE_OTHER): Payer: Self-pay | Admitting: Vascular Surgery

## 2023-05-16 VITALS — BP 149/94 | HR 76 | Resp 18 | Ht 65.0 in | Wt 233.0 lb

## 2023-05-16 DIAGNOSIS — I872 Venous insufficiency (chronic) (peripheral): Secondary | ICD-10-CM | POA: Diagnosis not present

## 2023-05-16 DIAGNOSIS — E782 Mixed hyperlipidemia: Secondary | ICD-10-CM | POA: Diagnosis not present

## 2023-05-16 DIAGNOSIS — I890 Lymphedema, not elsewhere classified: Secondary | ICD-10-CM

## 2023-05-16 DIAGNOSIS — I10 Essential (primary) hypertension: Secondary | ICD-10-CM

## 2023-05-20 ENCOUNTER — Encounter: Payer: Self-pay | Admitting: *Deleted

## 2023-05-20 ENCOUNTER — Telehealth: Payer: Self-pay | Admitting: *Deleted

## 2023-05-20 NOTE — Patient Outreach (Signed)
  Care Coordination   Initial Visit Note   05/20/2023 Name: Nicole Osborn MRN: [id number] DOB: [birth year]  Nicole Osborn is a 52 y.o. year old female who sees Berniece Salines, FNP for primary care. I spoke with  [name] by phone today.  What matters to the patients health and wellness today?  Manage anxiety levels.  She is active with counselor and doing well, will call this care manager with needs/concerns.    Goals Addressed             This Visit's Progress    COMPLETED: Care Coordination Activities - No follow up needed       Interventions Today    Flowsheet Row Most Recent Value  Chronic Disease   Chronic disease during today's visit Hypertension (HTN), Other  [anxiety and migraines]  General Interventions   General Interventions Discussed/Reviewed General Interventions Reviewed, Doctor Visits, Vaccines, Health Screening  Vaccines Flu, Pneumonia  Doctor Visits Discussed/Reviewed Doctor Visits Reviewed, Specialist, PCP  Health Screening Mammogram  PCP/Specialist Visits Compliance with follow-up visit  [name] with GI, psych counselor, and PCP on a regular basis]  Exercise Interventions   Exercise Discussed/Reviewed Weight Managment, Exercise Reviewed  Weight Management Weight maintenance  Education Interventions   Education Provided Provided Education  Provided Verbal Education On Nutrition, Medication, When to see the doctor, Mental Health/Coping with Illness  Mental Health Interventions   Mental Health Discussed/Reviewed Mental Health Reviewed, Anxiety  [already active with counselor]              SDOH assessments and interventions completed:  Yes  SDOH Interventions Today    Flowsheet Row Most Recent Value  SDOH Interventions   Food Insecurity Interventions Intervention Not Indicated  Housing Interventions Intervention Not Indicated  Transportation Interventions Intervention Not Indicated        Care Coordination Interventions:  Yes, provided    Follow up plan: No further intervention required.   Encounter Outcome:  Pt. Visit Completed   Kemper Durie, RN, MSN, [facility] Care Management Care Management Coordinator [phone]

## 2023-05-23 DIAGNOSIS — M545 Low back pain, unspecified: Secondary | ICD-10-CM | POA: Diagnosis not present

## 2023-05-26 ENCOUNTER — Encounter (INDEPENDENT_AMBULATORY_CARE_PROVIDER_SITE_OTHER): Payer: Self-pay | Admitting: Vascular Surgery

## 2023-05-26 NOTE — Progress Notes (Signed)
MRN : [id number]  Nicole Osborn is a 52 y.o. (1971-08-11) female who presents with chief complaint of legs swell.  History of Present Illness: The patient returns to the office for followup evaluation regarding leg swelling.  The swelling has persisted and the pain associated with swelling continues. There have not been any interval development of a ulcerations or wounds.  Since the previous visit the patient has been wearing graduated compression stockings and has noted little if any improvement in the lymphedema. The patient has been using compression routinely morning until night.  The patient also states elevation during the day and exercise is being done too.  Of note she did not receive a lymph pump after her last visit.  She is uncertain as to the reasons but I believe there were some insurance concerns.  Current Meds  Medication Sig   celecoxib (CELEBREX) 200 MG capsule Take 1 capsule (200 mg total) by mouth 2 (two) times daily.   DULoxetine (CYMBALTA) 20 MG capsule Take 2 capsules (40 mg total) by mouth daily.   Erenumab-aooe (AIMOVIG) 140 MG/ML SOAJ Inject 140 mg into the skin every 30 (thirty) days.   levothyroxine (SYNTHROID) 112 MCG tablet TAKE 1 TABLET BY MOUTH EVERY DAY BEFORE BREAKFAST   meloxicam (MOBIC) 15 MG tablet Take 15 mg by mouth daily.   OREGANO PO Take 150 capsules by mouth daily as needed (immune support).    Past Medical History:  Diagnosis Date   Anxiety    Arthritis    Cholecystitis with cholelithiasis 09/19/2017   Chronic venous insufficiency of lower extremity    Depression    Elevated blood pressure reading    no meds   GERD (gastroesophageal reflux disease)    occ   Heartburn    History of kidney stones    h/o   Hyperlipidemia    Hypothyroidism    Migraine 09/23/2017   Migraines    without aura   Obesity    Thyroid disease    Varicose veins of both lower extremities 09/06/2017    Past Surgical History:   Procedure Laterality Date   CHOLECYSTECTOMY N/A 10/08/2017   Procedure: LAPAROSCOPIC CHOLECYSTECTOMY;  Surgeon: Lattie Haw, MD;  Location: [facility] ORS;  Service: General;  Laterality: N/A;   COLONOSCOPY WITH PROPOFOL N/A 12/11/2019   Procedure: COLONOSCOPY WITH PROPOFOL;  Surgeon: Toney Reil, MD;  Location: [facility] ENDOSCOPY;  Service: Gastroenterology;  Laterality: N/A;   Removal of extra tooth     age 36    Social History Social History   Tobacco Use   Smoking status: Never   Smokeless tobacco: Never  Vaping Use   Vaping status: Never Used  Substance Use Topics   Alcohol use: No   Drug use: No    Family History Family History  Problem Relation Age of Onset   Migraines Mother    Depression Mother    Arthritis Mother    Stroke Father    Hypertension Father    Migraines Maternal Uncle    Breast cancer Paternal Aunt 24   Emphysema Maternal Grandmother    Emphysema Maternal Grandfather    Dementia Paternal Grandmother    Asthma Son    Eczema Son    Kidney disease Neg Hx    Bladder Cancer Neg Hx     Allergies  Allergen  Reactions   Wellbutrin [Bupropion] Other (See Comments)    Reaction - "insects crawling in my head"   Cefaclor Swelling    Lips swelling    Peanut-Containing Drug Products      REVIEW OF SYSTEMS (Negative unless checked)  Constitutional: [] Weight loss  [] Fever  [] Chills Cardiac: [] Chest pain   [] Chest pressure   [] Palpitations   [] Shortness of breath when laying flat   [] Shortness of breath with exertion. Vascular:  [] Pain in legs with walking   [x] Pain in legs with standing  [] History of DVT   [] Phlebitis   [x] Swelling in legs   [] Varicose veins   [] Non-healing ulcers Pulmonary:   [] Uses home oxygen   [] Productive cough   [] Hemoptysis   [] Wheeze  [] COPD   [] Asthma Neurologic:  [] Dizziness   [] Seizures   [] History of stroke   [] History of TIA  [] Aphasia   [] Vissual changes   [] Weakness or numbness in arm   [] Weakness or numbness in  leg Musculoskeletal:   [] Joint swelling   [] Joint pain   [] Low back pain Hematologic:  [] Easy bruising  [] Easy bleeding   [] Hypercoagulable state   [] Anemic Gastrointestinal:  [] Diarrhea   [] Vomiting  [] Gastroesophageal reflux/heartburn   [] Difficulty swallowing. Genitourinary:  [] Chronic kidney disease   [] Difficult urination  [] Frequent urination   [] Blood in urine Skin:  [] Rashes   [] Ulcers  Psychological:  [] History of anxiety   []  History of major depression.  Physical Examination  Vitals:   05/16/23 1425  BP: (!) 149/94  Pulse: 76  Resp: 18  Weight: 233 lb (105.7 kg)  Height: 5\' 5"  (1.651 m)   Body mass index is 38.77 kg/m. Gen: WD/WN, NAD Head: [place]/AT, No temporalis wasting.  Ear/Nose/Throat: Hearing grossly intact, nares w/o erythema or drainage, pinna without lesions Eyes: PER, EOMI, sclera nonicteric.  Neck: Supple, no gross masses.  No JVD.  Pulmonary:  Good air movement, no audible wheezing, no use of accessory muscles.  Cardiac: RRR, precordium not hyperdynamic. Vascular:  scattered varicosities present bilaterally.  Mild venous stasis changes to the legs bilaterally.  3-4+ soft pitting edema, CEAP C4sEpAsPr  Vessel Right Left  Radial Palpable Palpable  Gastrointestinal: soft, non-distended. No guarding/no peritoneal signs.  Musculoskeletal: M/S 5/5 throughout.  No deformity.  Neurologic: CN 2-12 intact. Pain and light touch intact in extremities.  Symmetrical.  Speech is fluent. Motor exam as listed above. Psychiatric: Judgment intact, Mood & affect appropriate for pt's clinical situation. Dermatologic: Venous rashes no ulcers noted.  No changes consistent with cellulitis. Lymph : No lichenification or skin changes of chronic lymphedema.  CBC Lab Results  Component Value Date   WBC 5.4 01/09/2023   HGB 13.4 01/09/2023   HCT 39.5 01/09/2023   MCV 88.4 01/09/2023   PLT 240 01/09/2023    BMET    Component Value Date/Time   NA 142 01/09/2023 0905   K 4.0  01/09/2023 0905   CL 108 01/09/2023 0905   CO2 24 01/09/2023 0905   GLUCOSE 118 (H) 01/09/2023 0905   BUN 13 01/09/2023 0905   CREATININE 0.65 01/09/2023 0905   CALCIUM 9.4 01/09/2023 0905   GFRNONAA >60 01/18/2022 1140   GFRNONAA 99 02/08/2021 1600   GFRAA 114 02/08/2021 1600   CrCl cannot be calculated (Patient's most recent lab result is older than the maximum 21 days allowed.).  COAG Lab Results  Component Value Date   INR 1.1 01/18/2022   INR 1.0 10/27/2019   INR 1.0 07/17/2019    Radiology No  results found.   Assessment/Plan 1. Lymphedema Recommend:  No surgery or intervention at this point in time.   The Patient is CEAP C4sEpAsPr.  The patient has been wearing compression for more than 12 weeks with no or little benefit.  The patient has been exercising daily for more than 12 weeks. The patient has been elevating and taking OTC pain medications for more than 12 weeks.  None of these have have eliminated the pain related to the lymphedema or the discomfort regarding excessive swelling and venous congestion.  In fact, the patient has worsening of her lymphedema and worsening of her symptoms.  Attempts at obtaining a pump for her in the past were not successful and this is the second request based on her worsening symptoms.  I have reviewed my discussion with the patient regarding lymphedema and why it  causes symptoms.  Patient will continue wearing graduated compression on a daily basis. The patient should put the compression on first thing in the morning and removing them in the evening. The patient should not sleep in the compression.   In addition, behavioral modification throughout the day will be continued.  This will include frequent elevation (such as in a recliner), use of over the counter pain medications as needed and exercise such as walking.  The systemic causes for chronic edema such as liver, kidney and cardiac etiologies do not appear to have significant  changed over the past year.    The patient has chronic , severe lymphedema with hyperpigmentation of the skin and has done MLD, skin care, medication, diet, exercise, elevation and compression for 4 weeks with no improvement,  I am recommending a lymphedema pump.  The patient still has stage 3 lymphedema and therefore, I believe that a lymph pump is needed to improve the control of the patient's lymphedema and improve the quality of life.  Additionally, a lymph pump is warranted because it will reduce the risk of cellulitis and ulceration in the future.  Patient should follow-up in six months   2. Chronic venous insufficiency Recommend:  No surgery or intervention at this point in time.   The Patient is CEAP C4sEpAsPr.  The patient has been wearing compression for more than 12 weeks with no or little benefit.  The patient has been exercising daily for more than 12 weeks. The patient has been elevating and taking OTC pain medications for more than 12 weeks.  None of these have have eliminated the pain related to the lymphedema or the discomfort regarding excessive swelling and venous congestion.  In fact, the patient has worsening of her lymphedema and worsening of her symptoms.  Attempts at obtaining a pump for her in the past were not successful and this is the second request based on her worsening symptoms.  I have reviewed my discussion with the patient regarding lymphedema and why it  causes symptoms.  Patient will continue wearing graduated compression on a daily basis. The patient should put the compression on first thing in the morning and removing them in the evening. The patient should not sleep in the compression.   In addition, behavioral modification throughout the day will be continued.  This will include frequent elevation (such as in a recliner), use of over the counter pain medications as needed and exercise such as walking.  The systemic causes for chronic edema such as liver,  kidney and cardiac etiologies do not appear to have significant changed over the past year.    The patient has  chronic , severe lymphedema with hyperpigmentation of the skin and has done MLD, skin care, medication, diet, exercise, elevation and compression for 4 weeks with no improvement,  I am recommending a lymphedema pump.  The patient still has stage 3 lymphedema and therefore, I believe that a lymph pump is needed to improve the control of the patient's lymphedema and improve the quality of life.  Additionally, a lymph pump is warranted because it will reduce the risk of cellulitis and ulceration in the future.  Patient should follow-up in six months   3. Essential hypertension Continue antihypertensive medications as already ordered, these medications have been reviewed and there are no changes at this time.  4. Mixed hyperlipidemia Continue statin as ordered and reviewed, no changes at this time    Levora Dredge, MD  05/26/2023 12:17 PM

## 2023-06-10 DIAGNOSIS — M545 Low back pain, unspecified: Secondary | ICD-10-CM | POA: Diagnosis not present

## 2023-06-18 DIAGNOSIS — Z872 Personal history of diseases of the skin and subcutaneous tissue: Secondary | ICD-10-CM | POA: Diagnosis not present

## 2023-06-18 DIAGNOSIS — Z859 Personal history of malignant neoplasm, unspecified: Secondary | ICD-10-CM | POA: Diagnosis not present

## 2023-06-18 DIAGNOSIS — D485 Neoplasm of uncertain behavior of skin: Secondary | ICD-10-CM | POA: Diagnosis not present

## 2023-06-18 DIAGNOSIS — L821 Other seborrheic keratosis: Secondary | ICD-10-CM | POA: Diagnosis not present

## 2023-06-18 DIAGNOSIS — D2261 Melanocytic nevi of right upper limb, including shoulder: Secondary | ICD-10-CM | POA: Diagnosis not present

## 2023-06-18 DIAGNOSIS — Z86018 Personal history of other benign neoplasm: Secondary | ICD-10-CM | POA: Diagnosis not present

## 2023-06-18 DIAGNOSIS — L578 Other skin changes due to chronic exposure to nonionizing radiation: Secondary | ICD-10-CM | POA: Diagnosis not present

## 2023-06-19 ENCOUNTER — Ambulatory Visit: Payer: Medicare Other | Admitting: Gastroenterology

## 2023-06-19 ENCOUNTER — Encounter: Payer: Self-pay | Admitting: Gastroenterology

## 2023-06-19 VITALS — BP 138/85 | HR 81 | Temp 98.4°F | Ht 65.0 in | Wt 234.4 lb

## 2023-06-19 DIAGNOSIS — K580 Irritable bowel syndrome with diarrhea: Secondary | ICD-10-CM | POA: Diagnosis not present

## 2023-06-19 NOTE — Patient Instructions (Signed)
Gave Ibguard samples and if they work you can buy this over the counter.  Printed you off the Low Fod Map diet information.

## 2023-06-19 NOTE — Progress Notes (Signed)
Arlyss Repress, MD [address]  [address]  [place], [place] [address]  Main: [phone]  Fax: [phone]    Gastroenterology Consultation  Referring Provider:     Berniece Salines, FNP Primary Care Physician:  Berniece Salines, FNP Primary Gastroenterologist:  Dr. Arlyss Repress Reason for Consultation: IBS-diarrhea        HPI:   Nicole Osborn is a 52 y.o. female referred by Berniece Salines, FNP  for consultation & management of 2 years h/o perianal itching, burning, hard to clean, occasional blood on wiping (every 2months or so), protrusion of tissue and has to push back in.  Denies constipation, or diarrhea.  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  She continues to have protrusion of the tissue per rectum after hemorrhoid ligation x2 in 2019.  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  She reports that she has been going through a lot of stress within last 1 year between school and also she was trying to get weight loss surgery performed but it did not happen.  She thinks, a lot of her GI symptoms has to do with her dietary habits.  Follow-up visit 10/04/2021 Patient is here for follow-up to see me after 2 years regarding her chronic symptoms of diarrhea, abdominal cramps and bloating.  However, her weight has been stable.  Patient reports that she stopped working as a Runner, broadcasting/film/video due to diarrheal episodes and she cannot work in a closed to due to her ongoing GI symptoms.  She is also not sure if she wants to continue to work as a Runner, broadcasting/film/video.  She has not been closely seeing her psychiatrist and her therapist.  She is trying  to apply for disability.  She is going through financial stress currently.  Patient underwent colonoscopy with biopsies which were unremarkable.  She had celiac panel negative, GI profile PCR and H. pylori breath test negative.  Follow-up visit 06/19/2023 Nicole Osborn is here for follow-up of diarrhea predominant IBS.  Her symptoms include lower abdominal cramps followed by diarrhea.  She denies any abdominal bloating.  Sometimes, she cannot make it to the bathroom.  She also reports that she generally has 2 formed bowel movements and 2-3 loose, yellow watery bowel movements a day.  She denies any nocturnal diarrhea.  She states that she is a stress eater and her weakness is processed sugars, sodas, bread and pasta.  She has been gaining weight.  Pancreatic fecal elastase was normal.  Alpha gal was negative, food allergy profile showed intolerance to milk, peanut, egg white, wheat.  She did not undergo upper endoscopy as recommended because she maxed her deductible.  She now has better insurance but she is going through a lot of stress.  She is also seeing a counselor for depression/ADD.  She does not smoke or drink alcohol NSAIDs: None  Antiplts/Anticoagulants/Anti thrombotics: None  GI Procedures:  Colonoscopy 12/11/2019 - Perianal skin tags found on perianal exam. - The examined portion of the ileum was normal. - Normal mucosa in the entire examined colon. Biopsied. - The distal rectum and anal verge are normal on retroflexion view. DIAGNOSIS:  A.  COLON; RANDOM COLD BIOPSY:  - COLONIC MUCOSA WITH INTACT CRYPT ARCHITECTURE AND LYMPHOID  AGGREGATES/FOLLICLES.  - NEGATIVE FOR ACTIVE INFLAMMATION, MICROSCOPIC COLITIS, AND DYSPLASIA.   She denies any GI surgeries She denies family history of GI malignancy She denies smoking or alcohol use  Past Medical History:  Diagnosis Date   Anxiety    Arthritis    Cholecystitis with cholelithiasis 09/19/2017   Chronic venous insufficiency of lower  extremity    Depression    Elevated blood pressure reading    no meds   GERD (gastroesophageal reflux disease)    occ   Heartburn    History of kidney stones    h/o   Hyperlipidemia    Hypothyroidism    Migraine 09/23/2017   Migraines    without aura   Obesity    Thyroid disease    Varicose veins of both lower extremities 09/06/2017    Past Surgical History:  Procedure Laterality Date   CHOLECYSTECTOMY N/A 10/08/2017   Procedure: LAPAROSCOPIC CHOLECYSTECTOMY;  Surgeon: Lattie Haw, MD;  Location: [facility] ORS;  Service: General;  Laterality: N/A;   COLONOSCOPY WITH PROPOFOL N/A 12/11/2019   Procedure: COLONOSCOPY WITH PROPOFOL;  Surgeon: Toney Reil, MD;  Location: [facility] ENDOSCOPY;  Service: Gastroenterology;  Laterality: N/A;   Removal of extra tooth     age 56    Current Outpatient Medications:    DULoxetine (CYMBALTA) 20 MG capsule, Take 2 capsules (40 mg total) by mouth daily., Disp: 180 capsule, Rfl: 1   levothyroxine (SYNTHROID) 112 MCG tablet, TAKE 1 TABLET BY MOUTH EVERY DAY BEFORE BREAKFAST, Disp: 90 tablet, Rfl: 2   meloxicam (MOBIC) 15 MG tablet, Take 15 mg by mouth daily., Disp: , Rfl:    OREGANO PO, Take 150 capsules by mouth daily as needed (immune support)., Disp: , Rfl:    SUMAtriptan (IMITREX) 100 MG tablet, Take 0.5-1 tablets (50-100 mg total) by mouth every 2 (two) hours as needed for migraine. May repeat in 2 hours if headache persists or recurs., Disp: 30 tablet, Rfl: 1   tiZANidine (ZANAFLEX) 4 MG tablet, Take 4 mg by mouth 4 (four) times daily., Disp: , Rfl:    Erenumab-aooe (AIMOVIG) 140 MG/ML SOAJ, Inject 140 mg into the skin every 30 (thirty) days. (Patient not taking: Reported on 06/19/2023), Disp: 1 mL, Rfl: 11    Family History  Problem Relation Age of Onset   Migraines Mother    Depression Mother    Arthritis Mother    Stroke Father    Hypertension Father    Migraines Maternal Uncle    Breast cancer Paternal Aunt 31   Emphysema  Maternal Grandmother    Emphysema Maternal Grandfather    Dementia Paternal Grandmother    Asthma Son    Eczema Son    Kidney disease Neg Hx    Bladder Cancer Neg Hx      Social History   Tobacco Use   Smoking status: Never   Smokeless tobacco: Never  Vaping Use   Vaping status: Never Used  Substance Use Topics   Alcohol use: No   Drug use: No    Allergies as of 06/19/2023 - Review Complete 06/19/2023  Allergen Reaction Noted   Wellbutrin [bupropion] Other (See Comments) 09/05/2020   Cefaclor Swelling 12/15/2015   Peanut-containing drug products  01/18/2022    Review of Systems:    All systems reviewed and negative except where noted in HPI.   Physical Exam:  BP 138/85 (BP  Location: Right Arm, Patient Position: Sitting, Cuff Size: Large)   Pulse 81   Temp 98.4 F (36.9 C) (Oral)   Ht 5\' 5"  (1.651 m)   Wt 234 lb 6 oz (106.3 kg)   LMP 07/12/2016   BMI 39.00 kg/m  Patient's last menstrual period was 07/12/2016.  General:   Alert,  Well-developed, well-nourished, pleasant and cooperative in NAD Head:  Normocephalic and atraumatic. Eyes:  Sclera clear, no icterus.  Conjunctiva pink. Ears:  Normal auditory acuity. Nose:  No deformity, discharge, or lesions. Mouth:  No deformity or lesions,oropharynx pink & moist. Neck:  Supple; no masses or thyromegaly. Lungs:  Respirations even and unlabored.  Clear throughout to auscultation.   No wheezes, crackles, or rhonchi. No acute distress. Heart:  Regular rate and rhythm; no murmurs, clicks, rubs, or gallops. Abdomen:  Normal bowel sounds. Soft, morbidly obese, non-tender and non-distended without palpable masses or hernias noted.  No guarding or rebound tenderness.   Rectal: Perianal skin tag Msk:  Symmetrical without gross deformities. Good, equal movement & strength bilaterally. Pulses:  Normal pulses noted. Extremities:  No clubbing or edema.  No cyanosis. Neurologic:  Alert and oriented x3;  grossly normal  neurologically. Skin: No rash, no jaundice. Psych:  Alert and cooperative. Normal mood and affect.  Imaging Studies: Reviewed  Assessment and Plan:   Nicole Osborn is a 52 y.o. Caucasian female with morbid obesity, history of anxiety, depression, status post laparoscopic cholecystectomy, hypothyroidism, outpatient hemorrhoid ligation for symptomatic external hemorrhoids x2 in 2019 is seen for follow-up of diarrhea predominant IBS  Diarrhea predominant IBS Colonoscopy with TI evaluation and random colon biopsies were unremarkable H. pylori breath test, GI profile PCR, celiac negative pancreatic fecal elastase levels were normal,  alpha gal panel was negative Food allergy profile revealed intolerance to egg white, peanuts, wheat, milk, she did not try eliminating any of these foods Discussed about low FODMAP diet, information provided Trial of IBgard, samples provided  Follow up as needed   Arlyss Repress, MD

## 2023-06-24 DIAGNOSIS — M545 Low back pain, unspecified: Secondary | ICD-10-CM | POA: Diagnosis not present

## 2023-07-02 DIAGNOSIS — M545 Low back pain, unspecified: Secondary | ICD-10-CM | POA: Diagnosis not present

## 2023-07-16 DIAGNOSIS — M545 Low back pain, unspecified: Secondary | ICD-10-CM | POA: Diagnosis not present

## 2023-08-27 ENCOUNTER — Other Ambulatory Visit: Payer: Self-pay | Admitting: Nurse Practitioner

## 2023-08-27 DIAGNOSIS — F330 Major depressive disorder, recurrent, mild: Secondary | ICD-10-CM

## 2023-08-29 NOTE — Telephone Encounter (Signed)
Called patient to schedule appt for medication refills. No answer, LVMTCB 
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
## 2023-10-02 DIAGNOSIS — D2261 Melanocytic nevi of right upper limb, including shoulder: Secondary | ICD-10-CM | POA: Diagnosis not present

## 2023-10-02 DIAGNOSIS — D2361 Other benign neoplasm of skin of right upper limb, including shoulder: Secondary | ICD-10-CM | POA: Diagnosis not present

## 2023-10-23 ENCOUNTER — Ambulatory Visit: Payer: Medicare Other | Admitting: Nurse Practitioner

## 2023-10-24 ENCOUNTER — Encounter: Payer: Self-pay | Admitting: Nurse Practitioner

## 2023-10-24 ENCOUNTER — Ambulatory Visit (INDEPENDENT_AMBULATORY_CARE_PROVIDER_SITE_OTHER): Payer: Medicare Other | Admitting: Nurse Practitioner

## 2023-10-24 VITALS — BP 132/84 | HR 94 | Temp 98.2°F | Resp 18 | Ht 65.0 in | Wt 234.7 lb

## 2023-10-24 DIAGNOSIS — R7303 Prediabetes: Secondary | ICD-10-CM

## 2023-10-24 DIAGNOSIS — F330 Major depressive disorder, recurrent, mild: Secondary | ICD-10-CM | POA: Diagnosis not present

## 2023-10-24 DIAGNOSIS — I10 Essential (primary) hypertension: Secondary | ICD-10-CM

## 2023-10-24 DIAGNOSIS — F411 Generalized anxiety disorder: Secondary | ICD-10-CM

## 2023-10-24 DIAGNOSIS — G43009 Migraine without aura, not intractable, without status migrainosus: Secondary | ICD-10-CM | POA: Diagnosis not present

## 2023-10-24 DIAGNOSIS — E782 Mixed hyperlipidemia: Secondary | ICD-10-CM | POA: Diagnosis not present

## 2023-10-24 DIAGNOSIS — E034 Atrophy of thyroid (acquired): Secondary | ICD-10-CM | POA: Diagnosis not present

## 2023-10-24 DIAGNOSIS — Z6838 Body mass index (BMI) 38.0-38.9, adult: Secondary | ICD-10-CM

## 2023-10-24 MED ORDER — DULOXETINE HCL 20 MG PO CPEP: 40.0000 mg | ORAL_CAPSULE | Freq: Every day | ORAL | 0 refills | Status: DC

## 2023-10-24 MED ORDER — SUMATRIPTAN SUCCINATE 100 MG PO TABS: 50.0000 mg | ORAL_TABLET | ORAL | 1 refills | Status: DC | PRN

## 2023-10-24 NOTE — Progress Notes (Signed)
BP 132/84   Pulse 94   Temp 98.2 F (36.8 C)   Resp 18   Ht 5\' 5"  (1.651 m)   Wt 234 lb 11.2 oz (106.5 kg)   LMP 07/12/2016   SpO2 97%   BMI 39.06 kg/m    Subjective:    Patient ID: Nicole Osborn, female    DOB: 01-Oct-1970, 53 y.o.   MRN: [id number]  HPI: Nicole Osborn is a 53 y.o. female  Chief Complaint  Patient presents with   Medical Management of Chronic Issues   Medication Refill    Discussed the use of AI scribe software for clinical note transcription with the patient, who gave verbal consent to proceed.  History of Present Illness   The patient, with a history of hypertension, migraines, hypothyroidism, hyperlipidemia, obesity, anxiety, and depression, presents with ongoing migraines and weight gain. She reports that her migraines have been occurring sporadically, with an episode lasting for two days in the past week. She uses Imitrex for her migraines, which she reports is effective most of the time. However, she also mentions that there are times when the medication does not provide sufficient relief.  The patient also discusses her struggle with weight gain. Despite attempts at diet changes, such as considering a carnivore diet and meal prepping, she has not been successful in losing weight. She attributes her weight gain to stress eating, emotional eating, addictive eating, and a slow metabolism.  In terms of her mental health, the patient is currently on Cymbalta. She expresses a desire to eventually stop taking the medication due to concerns about its long-term effects on the brain. However, she is also aware of the potential dangers of abruptly stopping the medication. She is considering seeking help from a functional medicine doctor but is unsure about making any major changes at this time.  The patient also mentions that she has started a new part-time job, which has added to her stress levels. She reports having difficulty remembering details and processes at  work. Additionally, she is dealing with financial stress due to her husband's job situation.       10/24/2023   11:06 AM 03/07/2023    8:54 AM 02/07/2023    9:44 AM  Depression screen PHQ 2/9  Decreased Interest 2 1 2   Down, Depressed, Hopeless 1 2 2   PHQ - 2 Score 3 3 4   Altered sleeping 1 2 2   Tired, decreased energy 1  0  Change in appetite 3 2 2   Feeling bad or failure about yourself  2 1 2   Trouble concentrating 2 0 2  Moving slowly or fidgety/restless 0 0 0  Suicidal thoughts 0 0 0  PHQ-9 Score 12 8 12   Difficult doing work/chores Somewhat difficult Somewhat difficult Somewhat difficult       10/24/2023   11:15 AM 02/07/2023    9:45 AM 09/13/2021    1:31 PM 02/08/2021    3:21 PM  GAD 7 : Generalized Anxiety Score  Nervous, Anxious, on Edge 1 2 0 2  Control/stop worrying 1 2 0 1  Worry too much - different things 1 2 0 2  Trouble relaxing 2 2 0 3  Restless 0 0 0 1  Easily annoyed or irritable 1 0 0 2  Afraid - awful might happen 1 0 0 1  Total GAD 7 Score 7 8 0 12  Anxiety Difficulty Somewhat difficult Somewhat difficult Not difficult at all Somewhat difficult     Relevant past  medical, surgical, family and social history reviewed and updated as indicated. Interim medical history since our last visit reviewed. Allergies and medications reviewed and updated.  Review of Systems  Constitutional: Negative for fever or weight change.  Respiratory: Negative for cough and shortness of breath.   Cardiovascular: Negative for chest pain or palpitations.  Gastrointestinal: Negative for abdominal pain, no bowel changes.  Musculoskeletal: Negative for gait problem or joint swelling.  Skin: Negative for rash.  Neurological: Negative for dizziness, positive for  headache.  No other specific complaints in a complete review of systems (except as listed in HPI above).      Objective:    BP 132/84   Pulse 94   Temp 98.2 F (36.8 C)   Resp 18   Ht 5\' 5"  (1.651 m)   Wt 234 lb  11.2 oz (106.5 kg)   LMP 07/12/2016   SpO2 97%   BMI 39.06 kg/m   BP Readings from Last 3 Encounters:  10/24/23 132/84  06/19/23 138/85  05/16/23 (!) 149/94     Wt Readings from Last 3 Encounters:  10/24/23 234 lb 11.2 oz (106.5 kg)  06/19/23 234 lb 6 oz (106.3 kg)  05/16/23 233 lb (105.7 kg)    Physical Exam Vitals reviewed.  Constitutional:      Appearance: Normal appearance.  HENT:     Head: Normocephalic.  Cardiovascular:     Rate and Rhythm: Normal rate and regular rhythm.  Pulmonary:     Effort: Pulmonary effort is normal.     Breath sounds: Normal breath sounds.  Musculoskeletal:        General: Normal range of motion.  Skin:    General: Skin is warm and dry.  Neurological:     General: No focal deficit present.     Mental Status: She is alert and oriented to person, place, and time. Mental status is at baseline.  Psychiatric:        Mood and Affect: Mood normal.        Behavior: Behavior normal.        Thought Content: Thought content normal.        Judgment: Judgment normal.     Results for orders placed or performed in visit on 01/09/23  CBC with Differential/Platelet   Collection Time: 01/09/23  9:05 AM  Result Value Ref Range   WBC 5.4 3.8 - 10.8 Thousand/uL   RBC 4.47 3.80 - 5.10 Million/uL   Hemoglobin 13.4 11.7 - 15.5 g/dL   HCT 09.8 11.9 - 14.7 %   MCV 88.4 80.0 - 100.0 fL   MCH 30.0 27.0 - 33.0 pg   MCHC 33.9 32.0 - 36.0 g/dL   RDW 82.9 56.2 - 13.0 %   Platelets 240 140 - 400 Thousand/uL   MPV 10.7 7.5 - 12.5 fL   Neutro Abs 2,808 1,500 - 7,800 cells/uL   Lymphs Abs 1,912 850 - 3,900 cells/uL   Absolute Monocytes 421 200 - 950 cells/uL   Eosinophils Absolute 189 15 - 500 cells/uL   Basophils Absolute 70 0 - 200 cells/uL   Neutrophils Relative % 52 %   Total Lymphocyte 35.4 %   Monocytes Relative 7.8 %   Eosinophils Relative 3.5 %   Basophils Relative 1.3 %  COMPLETE METABOLIC PANEL WITH GFR   Collection Time: 01/09/23  9:05 AM   Result Value Ref Range   Glucose, Bld 118 (H) 65 - 99 mg/dL   BUN 13 7 - 25 mg/dL   Creat  0.65 0.50 - 1.03 mg/dL   eGFR 161 > OR = 60 WR/UEA/[id number]   BUN/Creatinine Ratio SEE NOTE: 6 - 22 (calc)   Sodium 142 135 - 146 mmol/L   Potassium 4.0 3.5 - 5.3 mmol/L   Chloride 108 98 - 110 mmol/L   CO2 24 20 - 32 mmol/L   Calcium 9.4 8.6 - 10.4 mg/dL   Total Protein 6.7 6.1 - 8.1 g/dL   Albumin 4.4 3.6 - 5.1 g/dL   Globulin 2.3 1.9 - 3.7 g/dL (calc)   AG Ratio 1.9 1.0 - 2.5 (calc)   Total Bilirubin 0.3 0.2 - 1.2 mg/dL   Alkaline phosphatase (APISO) 78 37 - 153 U/L   AST 13 10 - 35 U/L   ALT 11 6 - 29 U/L  Lipid panel   Collection Time: 01/09/23  9:05 AM  Result Value Ref Range   Cholesterol 217 (H) <200 mg/dL   HDL 45 (L) > OR = 50 mg/dL   Triglycerides 119 <147 mg/dL   LDL Cholesterol (Calc) 146 (H) mg/dL (calc)   Total CHOL/HDL Ratio 4.8 <5.0 (calc)   Non-HDL Cholesterol (Calc) 172 (H) <130 mg/dL (calc)  Hemoglobin W2N   Collection Time: 01/09/23  9:05 AM  Result Value Ref Range   Hgb A1c MFr Bld 6.2 (H) <5.7 % of total Hgb   Mean Plasma Glucose 131 mg/dL   eAG (mmol/L) 7.3 mmol/L  TSH   Collection Time: 01/09/23  9:05 AM  Result Value Ref Range   TSH 2.01 mIU/L  Hepatitis C antibody   Collection Time: 01/09/23  9:05 AM  Result Value Ref Range   Hepatitis C Ab NON-REACTIVE NON-REACTIVE       Assessment & Plan:   Problem List Items Addressed This Visit       Cardiovascular and Mediastinum   Essential hypertension - Primary   Relevant Orders   CBC with Differential/Platelet   COMPLETE METABOLIC PANEL WITH GFR   Migraine   Relevant Medications   SUMAtriptan (IMITREX) 100 MG tablet   DULoxetine (CYMBALTA) 20 MG capsule     Endocrine   Hypothyroidism, unspecified   Relevant Orders   TSH     Other   Hyperlipidemia   Relevant Orders   Lipid panel   Anxiety disorder   Relevant Medications   DULoxetine (CYMBALTA) 20 MG capsule   Class 2 severe obesity with  serious comorbidity and body mass index (BMI) of 38.0 to 38.9 in adult [facility])   Comorbidities include htn, hld, hypothyroidism, depression and anxiety      Mild episode of recurrent major depressive disorder (HCC)   Relevant Medications   DULoxetine (CYMBALTA) 20 MG capsule   Other Visit Diagnoses       Prediabetes       Relevant Orders   COMPLETE METABOLIC PANEL WITH GFR   Hemoglobin A1c        Assessment and Plan    Hypertension Reports occasional high readings, potentially related to stress. No changes in medication discussed. -decrease sodium in your diet  -Continue current management and stress management strategies.  Migraines Reports recent episodes, managed with Imitrex which is generally effective. No changes in medication discussed. -Continue Imitrex as needed for migraines.  Hypothyroidism On Levothyroxine daily. No symptoms suggestive of hypo/hyperthyroidism. Labs to be drawn. -Check thyroid function tests today. -If labs are within normal limits, continue Levothyroxine daily.  Depression/Anxiety On Cymbalta 40mg  daily. Reports ongoing stressors but no acute concerns or changes in mood symptoms  discussed. -continue therapy -Continue Cymbalta 40mg  daily.  Obesity Patient expresses desire to lose weight and discusses dietary changes. No specific plan discussed. -Encourage continued efforts towards healthy diet and weight loss.  Hyperlipidemia -will get labs today -Continue current management.  General Health Maintenance -Draw labs today for routine monitoring.        Follow up plan: Return in about 6 months (around 04/22/2024) for follow up.

## 2023-10-24 NOTE — Assessment & Plan Note (Signed)
Comorbidities include htn, hld, hypothyroidism, depression and anxiety

## 2023-10-25 ENCOUNTER — Encounter: Payer: Self-pay | Admitting: Nurse Practitioner

## 2023-10-25 LAB — CBC WITH DIFFERENTIAL/PLATELET
Absolute Lymphocytes: 2057 {cells}/uL (ref 850–3900)
Absolute Monocytes: 429 {cells}/uL (ref 200–950)
Basophils Absolute: 110 {cells}/uL (ref 0–200)
Basophils Relative: 2 %
Eosinophils Absolute: 220 {cells}/uL (ref 15–500)
Eosinophils Relative: 4 %
HCT: 42 % (ref 35.0–45.0)
Hemoglobin: 13.9 g/dL (ref 11.7–15.5)
MCH: 30.2 pg (ref 27.0–33.0)
MCHC: 33.1 g/dL (ref 32.0–36.0)
MCV: 91.3 fL (ref 80.0–100.0)
MPV: 10.9 fL (ref 7.5–12.5)
Monocytes Relative: 7.8 %
Neutro Abs: 2684 {cells}/uL (ref 1500–7800)
Neutrophils Relative %: 48.8 %
Platelets: 241 10*3/uL (ref 140–400)
RBC: 4.6 10*6/uL (ref 3.80–5.10)
RDW: 12.9 % (ref 11.0–15.0)
Total Lymphocyte: 37.4 %
WBC: 5.5 10*3/uL (ref 3.8–10.8)

## 2023-10-25 LAB — COMPLETE METABOLIC PANEL WITH GFR
AG Ratio: 1.7 (calc) (ref 1.0–2.5)
ALT: 11 U/L (ref 6–29)
AST: 12 U/L (ref 10–35)
Albumin: 4.5 g/dL (ref 3.6–5.1)
Alkaline phosphatase (APISO): 83 U/L (ref 37–153)
BUN: 12 mg/dL (ref 7–25)
CO2: 25 mmol/L (ref 20–32)
Calcium: 9.5 mg/dL (ref 8.6–10.4)
Chloride: 107 mmol/L (ref 98–110)
Creat: 0.65 mg/dL (ref 0.50–1.03)
Globulin: 2.7 g/dL (ref 1.9–3.7)
Glucose, Bld: 109 mg/dL — ABNORMAL HIGH (ref 65–99)
Potassium: 4.3 mmol/L (ref 3.5–5.3)
Sodium: 142 mmol/L (ref 135–146)
Total Bilirubin: 0.5 mg/dL (ref 0.2–1.2)
Total Protein: 7.2 g/dL (ref 6.1–8.1)
eGFR: 105 mL/min/{1.73_m2} (ref >=60)

## 2023-10-25 LAB — LIPID PANEL
Cholesterol: 221 mg/dL — ABNORMAL HIGH (ref <200)
HDL: 44 mg/dL — ABNORMAL LOW (ref >=50)
LDL Cholesterol (Calc): 148 mg/dL — ABNORMAL HIGH
Non-HDL Cholesterol (Calc): 177 mg/dL — ABNORMAL HIGH (ref <130)
Total CHOL/HDL Ratio: 5 (calc) — ABNORMAL HIGH (ref <5.0)
Triglycerides: 159 mg/dL — ABNORMAL HIGH (ref <150)

## 2023-10-25 LAB — TSH: TSH: 1.6 m[IU]/L

## 2023-10-25 LAB — HEMOGLOBIN A1C
Hgb A1c MFr Bld: 6.3 %{Hb} — ABNORMAL HIGH (ref <5.7)
Mean Plasma Glucose: 134 mg/dL
eAG (mmol/L): 7.4 mmol/L

## 2023-11-20 NOTE — Progress Notes (Signed)
 MRN : [id number]  Nicole Osborn is a 53 y.o. (31-Jul-1971) female who presents with chief complaint of legs swell.  History of Present Illness:   The patient returns to the office for followup evaluation regarding leg swelling.  The swelling has persisted and the pain associated with swelling continues. There have not been any interval development of a ulcerations or wounds.   Since the previous visit the patient has been wearing graduated compression stockings and has noted little if any improvement in the lymphedema. The patient has been using compression routinely morning until night.   The patient also states elevation during the day and exercise is being done too.  Of note, yet again she did not receive a lymph pump after her last visit.  This is now the third request that we are making.  She is uncertain as to the reasons but I believe there were some insurance concerns.  No outpatient medications have been marked as taking for the 11/21/23 encounter (Appointment) with Gilda Crease, Latina Craver, MD.    Past Medical History:  Diagnosis Date   Anxiety    Arthritis    Cholecystitis with cholelithiasis 09/19/2017   Chronic venous insufficiency of lower extremity    Depression    Elevated blood pressure reading    no meds   GERD (gastroesophageal reflux disease)    occ   Heartburn    History of kidney stones    h/o   Hyperlipidemia    Hypothyroidism    Migraine 09/23/2017   Migraines    without aura   Obesity    Thyroid disease    Varicose veins of both lower extremities 09/06/2017    Past Surgical History:  Procedure Laterality Date   CHOLECYSTECTOMY N/A 10/08/2017   Procedure: LAPAROSCOPIC CHOLECYSTECTOMY;  Surgeon: Lattie Haw, MD;  Location: [facility] ORS;  Service: General;  Laterality: N/A;   COLONOSCOPY WITH PROPOFOL N/A 12/11/2019   Procedure: COLONOSCOPY WITH PROPOFOL;  Surgeon: Toney Reil, MD;  Location: [facility] ENDOSCOPY;   Service: Gastroenterology;  Laterality: N/A;   Removal of extra tooth     age 89    Social History Social History   Tobacco Use   Smoking status: Never   Smokeless tobacco: Never  Vaping Use   Vaping status: Never Used  Substance Use Topics   Alcohol use: No   Drug use: No    Family History Family History  Problem Relation Age of Onset   Migraines Mother    Depression Mother    Arthritis Mother    Stroke Father    Hypertension Father    Migraines Maternal Uncle    Breast cancer Paternal Aunt 48   Emphysema Maternal Grandmother    Emphysema Maternal Grandfather    Dementia Paternal Grandmother    Asthma Son    Eczema Son    Kidney disease Neg Hx    Bladder Cancer Neg Hx     Allergies  Allergen Reactions   Wellbutrin [Bupropion] Other (See Comments)    Reaction - "insects crawling in my head"   Cefaclor Swelling    Lips swelling    Peanut-Containing Drug Products      REVIEW OF SYSTEMS (Negative unless checked)  Constitutional: [] Weight loss  [] Fever  [] Chills Cardiac: [] Chest pain   [] Chest pressure   [] Palpitations   [] Shortness  of breath when laying flat   [] Shortness of breath with exertion. Vascular:  [] Pain in legs with walking   [x] Pain in legs with standing  [] History of DVT   [] Phlebitis   [x] Swelling in legs   [] Varicose veins   [] Non-healing ulcers Pulmonary:   [] Uses home oxygen   [] Productive cough   [] Hemoptysis   [] Wheeze  [] COPD   [] Asthma Neurologic:  [] Dizziness   [] Seizures   [] History of stroke   [] History of TIA  [] Aphasia   [] Vissual changes   [] Weakness or numbness in arm   [] Weakness or numbness in leg Musculoskeletal:   [] Joint swelling   [] Joint pain   [] Low back pain Hematologic:  [] Easy bruising  [] Easy bleeding   [] Hypercoagulable state   [] Anemic Gastrointestinal:  [] Diarrhea   [] Vomiting  [] Gastroesophageal reflux/heartburn   [] Difficulty swallowing. Genitourinary:  [] Chronic kidney disease   [] Difficult urination  [] Frequent  urination   [] Blood in urine Skin:  [] Rashes   [] Ulcers  Psychological:  [] History of anxiety   []  History of major depression.  Physical Examination  There were no vitals filed for this visit. There is no height or weight on file to calculate BMI. Gen: WD/WN, NAD Head: [place]/AT, No temporalis wasting.  Ear/Nose/Throat: Hearing grossly intact, nares w/o erythema or drainage, pinna without lesions Eyes: PER, EOMI, sclera nonicteric.  Neck: Supple, no gross masses.  No JVD.  Pulmonary:  Good air movement, no audible wheezing, no use of accessory muscles.  Cardiac: RRR, precordium not hyperdynamic. Vascular:  scattered varicosities present bilaterally.  Mild venous stasis changes to the legs bilaterally.  3-4+ soft pitting edema, CEAP C4sEpAsPr  Vessel Right Left  Radial Palpable Palpable  Gastrointestinal: soft, non-distended. No guarding/no peritoneal signs.  Musculoskeletal: M/S 5/5 throughout.  No deformity.  Neurologic: CN 2-12 intact. Pain and light touch intact in extremities.  Symmetrical.  Speech is fluent. Motor exam as listed above. Psychiatric: Judgment intact, Mood & affect appropriate for pt's clinical situation. Dermatologic: Venous rashes no ulcers noted.  No changes consistent with cellulitis. Lymph : No lichenification or skin changes of chronic lymphedema.  CBC Lab Results  Component Value Date   WBC 5.5 10/24/2023   HGB 13.9 10/24/2023   HCT 42.0 10/24/2023   MCV 91.3 10/24/2023   PLT 241 10/24/2023    BMET    Component Value Date/Time   NA 142 10/24/2023 1142   K 4.3 10/24/2023 1142   CL 107 10/24/2023 1142   CO2 25 10/24/2023 1142   GLUCOSE 109 (H) 10/24/2023 1142   BUN 12 10/24/2023 1142   CREATININE 0.65 10/24/2023 1142   CALCIUM 9.5 10/24/2023 1142   GFRNONAA >60 01/18/2022 1140   GFRNONAA 99 02/08/2021 1600   GFRAA 114 02/08/2021 1600   CrCl cannot be calculated (Patient's most recent lab result is older than the maximum 21 days  allowed.).  COAG Lab Results  Component Value Date   INR 1.1 01/18/2022   INR 1.0 10/27/2019   INR 1.0 07/17/2019    Radiology No results found.   Assessment/Plan 1. Lymphedema (Primary) Recommend:  No surgery or intervention at this point in time.   The Patient is CEAP C4sEpAsPr.  The patient has been wearing compression for more than 12 weeks with no or little benefit.  The patient has been exercising daily for more than 12 weeks. The patient has been elevating and taking OTC pain medications for more than 12 weeks.  None of these have have eliminated the pain related to  the lymphedema or the discomfort regarding excessive swelling and venous congestion.    I have reviewed my discussion with the patient regarding lymphedema and why it  causes symptoms.  Patient will continue wearing graduated compression on a daily basis. The patient should put the compression on first thing in the morning and removing them in the evening. The patient should not sleep in the compression.   In addition, behavioral modification throughout the day will be continued.  This will include frequent elevation (such as in a recliner), use of over the counter pain medications as needed and exercise such as walking.  The systemic causes for chronic edema such as liver, kidney and cardiac etiologies do not appear to have significant changed over the past year.    The patient has chronic , severe lymphedema with hyperpigmentation of the skin and has done MLD, skin care, medication, diet, exercise, elevation and compression for 4 weeks with no improvement,  I am recommending a lymphedema pump.  The patient still has stage 3 lymphedema and therefore, I believe that a lymph pump is needed to improve the control of the patient's lymphedema and improve the quality of life.  Additionally, a lymph pump is warranted because it will reduce the risk of cellulitis and ulceration in the future.  Patient should follow-up in  six months   2. Chronic venous insufficiency Recommend:  No surgery or intervention at this point in time.   The Patient is CEAP C4sEpAsPr.  The patient has been wearing compression for more than 12 weeks with no or little benefit.  The patient has been exercising daily for more than 12 weeks. The patient has been elevating and taking OTC pain medications for more than 12 weeks.  None of these have have eliminated the pain related to the lymphedema or the discomfort regarding excessive swelling and venous congestion.    I have reviewed my discussion with the patient regarding lymphedema and why it  causes symptoms.  Patient will continue wearing graduated compression on a daily basis. The patient should put the compression on first thing in the morning and removing them in the evening. The patient should not sleep in the compression.   In addition, behavioral modification throughout the day will be continued.  This will include frequent elevation (such as in a recliner), use of over the counter pain medications as needed and exercise such as walking.  The systemic causes for chronic edema such as liver, kidney and cardiac etiologies do not appear to have significant changed over the past year.    The patient has chronic , severe lymphedema with hyperpigmentation of the skin and has done MLD, skin care, medication, diet, exercise, elevation and compression for 4 weeks with no improvement,  I am recommending a lymphedema pump.  The patient still has stage 3 lymphedema and therefore, I believe that a lymph pump is needed to improve the control of the patient's lymphedema and improve the quality of life.  Additionally, a lymph pump is warranted because it will reduce the risk of cellulitis and ulceration in the future.  Patient should follow-up in six months   3. Essential hypertension Continue antihypertensive medications as already ordered, these medications have been reviewed and there are no  changes at this time.  4. Mixed hyperlipidemia Continue statin as ordered and reviewed, no changes at this time    Levora Dredge, MD  11/20/2023 8:26 PM

## 2023-11-21 ENCOUNTER — Ambulatory Visit (INDEPENDENT_AMBULATORY_CARE_PROVIDER_SITE_OTHER): Payer: Medicare Other | Admitting: Vascular Surgery

## 2023-11-21 VITALS — BP 143/81 | HR 70 | Resp 16 | Wt 236.0 lb

## 2023-11-21 DIAGNOSIS — I890 Lymphedema, not elsewhere classified: Secondary | ICD-10-CM

## 2023-11-21 DIAGNOSIS — I872 Venous insufficiency (chronic) (peripheral): Secondary | ICD-10-CM | POA: Diagnosis not present

## 2023-11-21 DIAGNOSIS — I10 Essential (primary) hypertension: Secondary | ICD-10-CM | POA: Diagnosis not present

## 2023-11-21 DIAGNOSIS — E782 Mixed hyperlipidemia: Secondary | ICD-10-CM

## 2023-11-24 ENCOUNTER — Encounter (INDEPENDENT_AMBULATORY_CARE_PROVIDER_SITE_OTHER): Payer: Self-pay | Admitting: Vascular Surgery

## 2023-11-27 ENCOUNTER — Other Ambulatory Visit: Payer: Self-pay | Admitting: Nurse Practitioner

## 2023-11-27 DIAGNOSIS — E034 Atrophy of thyroid (acquired): Secondary | ICD-10-CM
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
## 2023-12-03 ENCOUNTER — Encounter (INDEPENDENT_AMBULATORY_CARE_PROVIDER_SITE_OTHER): Payer: Self-pay | Admitting: Vascular Surgery

## 2023-12-09 ENCOUNTER — Encounter (INDEPENDENT_AMBULATORY_CARE_PROVIDER_SITE_OTHER): Payer: Self-pay | Admitting: Vascular Surgery

## 2023-12-09 ENCOUNTER — Encounter (INDEPENDENT_AMBULATORY_CARE_PROVIDER_SITE_OTHER): Payer: Self-pay | Admitting: Nurse Practitioner

## 2024-01-09 DIAGNOSIS — I890 Lymphedema, not elsewhere classified: Secondary | ICD-10-CM | POA: Diagnosis not present

## 2024-02-11 ENCOUNTER — Encounter (INDEPENDENT_AMBULATORY_CARE_PROVIDER_SITE_OTHER): Payer: Self-pay

## 2024-02-23 ENCOUNTER — Other Ambulatory Visit: Payer: Self-pay | Admitting: Nurse Practitioner

## 2024-02-23 DIAGNOSIS — F330 Major depressive disorder, recurrent, mild: Secondary | ICD-10-CM

## 2024-02-24 NOTE — Telephone Encounter (Signed)
 Requested Prescriptions  Pending Prescriptions Disp Refills   DULoxetine  (CYMBALTA ) 20 MG capsule [Pharmacy Med Name: DULOXETINE  HCL DR 20 MG CAP] 180 capsule 0    Sig: TAKE 2 CAPSULES BY MOUTH EVERY DAY     Psychiatry: Antidepressants - SNRI - duloxetine  Failed - 02/24/2024  4:39 PM      Failed - Last BP in normal range    BP Readings from Last 1 Encounters:  11/21/23 (!) 143/81         Failed - Valid encounter within last 6 months    Recent Outpatient Visits   None     Future Appointments             In 1 month Pender, Monalisa Angles, FNP [facility]            Passed - Cr in normal range and within 360 days    Creat  Date Value Ref Range Status  10/24/2023 0.65 0.50 - 1.03 mg/dL Final         Passed - eGFR is 30 or above and within 360 days    GFR, Est African American  Date Value Ref Range Status  02/08/2021 114 > OR = 60 mL/min/1.54m2 Final   GFR, Est Non African American  Date Value Ref Range Status  02/08/2021 99 > OR = 60 mL/min/1.91m2 Final   GFR, Estimated  Date Value Ref Range Status  01/18/2022 >60 >60 mL/min Final    Comment:    (NOTE) Calculated using the CKD-EPI Creatinine Equation (2021)    eGFR  Date Value Ref Range Status  10/24/2023 105 > OR = 60 mL/min/1.67m2 Final         Passed - Completed PHQ-2 or PHQ-9 in the last 360 days

## 2024-02-25 DIAGNOSIS — J019 Acute sinusitis, unspecified: Secondary | ICD-10-CM | POA: Diagnosis not present

## 2024-02-25 DIAGNOSIS — J029 Acute pharyngitis, unspecified: Secondary | ICD-10-CM | POA: Diagnosis not present

## 2024-02-25 DIAGNOSIS — J40 Bronchitis, not specified as acute or chronic: Secondary | ICD-10-CM | POA: Diagnosis not present

## 2024-02-25 DIAGNOSIS — Z03818 Encounter for observation for suspected exposure to other biological agents ruled out: Secondary | ICD-10-CM | POA: Diagnosis not present

## 2024-03-12 ENCOUNTER — Ambulatory Visit: Payer: Self-pay

## 2024-03-12 DIAGNOSIS — Z0000 Encounter for general adult medical examination without abnormal findings: Secondary | ICD-10-CM | POA: Diagnosis not present

## 2024-03-12 NOTE — Progress Notes (Signed)
 Subjective:   Nicole Osborn is a 53 y.o. who presents for a Medicare Wellness preventive visit.  As a reminder, Annual Wellness Visits don't include a physical exam, and some assessments may be limited, especially if this visit is performed virtually. We may recommend an in-person follow-up visit with your provider if needed.  Visit Complete: Virtual I connected with  NAVAYAH SOK on 03/12/24 by a audio enabled telemedicine application and verified that I am speaking with the correct person using two identifiers.  Patient Location: Home  Provider Location: Home Office  I discussed the limitations of evaluation and management by telemedicine. The patient expressed understanding and agreed to proceed.  Vital Signs: Because this visit was a virtual/telehealth visit, some criteria may be missing or patient reported. Any vitals not documented were not able to be obtained and vitals that have been documented are patient reported.  VideoDeclined- This patient declined Librarian, academic. Therefore the visit was completed with audio only.  Persons Participating in Visit: Patient.  AWV Questionnaire: No: Patient Medicare AWV questionnaire was not completed prior to this visit.  Cardiac Risk Factors include: advanced age (>4men, >21 women);dyslipidemia;hypertension;obesity (BMI >30kg/m2)     Objective:    Today's Vitals   03/12/24 1137  PainSc: 3    There is no height or weight on file to calculate BMI.     03/12/2024   11:44 AM 03/07/2023    9:03 AM 07/19/2022   10:23 AM 01/18/2022   10:47 AM 12/11/2019    9:20 AM 10/27/2019    6:55 PM 10/08/2017    3:58 PM  Advanced Directives  Does Patient Have a Medical Advance Directive? No No No No No No No   Would patient like information on creating a medical advance directive? No - Patient declined  No - Patient declined No - Patient declined  No - Patient declined No - Patient declined      Data saved with a  previous flowsheet row definition    Current Medications (verified) Outpatient Encounter Medications as of 03/12/2024  Medication Sig   DULoxetine  (CYMBALTA ) 20 MG capsule TAKE 2 CAPSULES BY MOUTH EVERY DAY   levothyroxine  (SYNTHROID ) 112 MCG tablet TAKE 1 TABLET BY MOUTH EVERY DAY BEFORE BREAKFAST   OREGANO PO Take 150 capsules by mouth daily as needed (immune support).   SUMAtriptan  (IMITREX ) 100 MG tablet Take 0.5-1 tablets (50-100 mg total) by mouth every 2 (two) hours as needed for migraine. May repeat in 2 hours if headache persists or recurs.   tiZANidine (ZANAFLEX) 4 MG tablet Take 4 mg by mouth 4 (four) times daily.   meloxicam (MOBIC) 15 MG tablet Take 15 mg by mouth daily. (Patient not taking: Reported on 03/12/2024)   No facility-administered encounter medications on file as of 03/12/2024.    Allergies (verified) Peanut-containing drug products, Wellbutrin  [bupropion ], and Cefaclor   History: Past Medical History:  Diagnosis Date   Anxiety    Arthritis    Cholecystitis with cholelithiasis 09/19/2017   Chronic venous insufficiency of lower extremity    Depression    Elevated blood pressure reading    no meds   GERD (gastroesophageal reflux disease)    occ   Heartburn    History of kidney stones    h/o   Hyperlipidemia    Hypothyroidism    Migraine 09/23/2017   Migraines    without aura   Obesity    Thyroid  disease    Varicose veins of both  lower extremities 09/06/2017   Past Surgical History:  Procedure Laterality Date   CHOLECYSTECTOMY N/A 10/08/2017   Procedure: LAPAROSCOPIC CHOLECYSTECTOMY;  Surgeon: Claudia Cuff, MD;  Location: [facility] ORS;  Service: General;  Laterality: N/A;   COLONOSCOPY WITH PROPOFOL  N/A 12/11/2019   Procedure: COLONOSCOPY WITH PROPOFOL ;  Surgeon: Selena Daily, MD;  Location: [facility] ENDOSCOPY;  Service: Gastroenterology;  Laterality: N/A;   Removal of extra tooth     age 1   Family History  Problem Relation Age of Onset    Migraines Mother    Depression Mother    Arthritis Mother    Stroke Father    Hypertension Father    Migraines Maternal Uncle    Breast cancer Paternal Aunt 7   Emphysema Maternal Grandmother    Emphysema Maternal Grandfather    Dementia Paternal Grandmother    Asthma Son    Eczema Son    Kidney disease Neg Hx    Bladder Cancer Neg Hx    Social History   Socioeconomic History   Marital status: Married    Spouse name: Not on file   Number of children: 2   Years of education: Not on file   Highest education level: Not on file  Occupational History   Not on file  Tobacco Use   Smoking status: Never   Smokeless tobacco: Never  Vaping Use   Vaping status: Never Used  Substance and Sexual Activity   Alcohol use: No   Drug use: No   Sexual activity: Yes    Birth control/protection: Surgical, Other-see comments    Comment: Husband with vastectomy   Other Topics Concern   Not on file  Social History Narrative   Caffiene; 2 sodas per day   Teacher   Applied disability: migraines/ anxiety/depression/ PHN/ chronic venous insufficiency thru SSD.   Social Drivers of Corporate investment banker Strain: Low Risk  (03/12/2024)   Overall Financial Resource Strain (CARDIA)    Difficulty of Paying Living Expenses: Not very hard  Recent Concern: Financial Resource Strain - High Risk (02/25/2024)   [facility]   Overall Financial Resource Strain (CARDIA)    Difficulty of Paying Living Expenses: Hard  Food Insecurity: No Food Insecurity (03/12/2024)   Hunger Vital Sign    Worried About Running Out of Food in the Last Year: Never true    Ran Out of Food in the Last Year: Never true  Recent Concern: Food Insecurity - Food Insecurity Present (02/25/2024)   Received from [facility]   Hunger Vital Sign    Within the past 12 months, you worried that your food would run out before you got the money to buy more.: Sometimes true    Within the  past 12 months, the food you bought just didn't last and you didn't have money to get more.: Sometimes true  Transportation Needs: No Transportation Needs (03/12/2024)   PRAPARE - Administrator, Civil Service (Medical): No    Lack of Transportation (Non-Medical): No  Physical Activity: Sufficiently Active (03/12/2024)   Exercise Vital Sign    Days of Exercise per Week: 3 days    Minutes of Exercise per Session: 50 min  Stress: No Stress Concern Present (03/12/2024)   [employer] of Occupational Health - Occupational Stress Questionnaire    Feeling of Stress: Only a little  Social Connections: Moderately Integrated (03/12/2024)   Social Connection and Isolation Panel    Frequency of  Communication with Friends and Family: More than three times a week    Frequency of Social Gatherings with Friends and Family: Three times a week    Attends Religious Services: More than 4 times per year    Active Member of Clubs or Organizations: No    Attends Banker Meetings: Never    Marital Status: Married    Tobacco Counseling Counseling given: Not Answered    Clinical Intake:  Pre-visit preparation completed: Yes  Pain : 0-10 Pain Score: 3  Pain Type: Chronic pain Pain Location: Hip Pain Orientation: Right, Left Pain Descriptors / Indicators: Aching, Discomfort Pain Onset: More than a month ago Pain Frequency: Constant     BMI - recorded: 39.3 Nutritional Status: BMI > 30  Obese Nutritional Risks: None Diabetes: No  Lab Results  Component Value Date   HGBA1C 6.3 (H) 10/24/2023   HGBA1C 6.2 (H) 01/09/2023   HGBA1C 6.1 (H) 09/13/2021     How often do you need to have someone help you when you read instructions, pamphlets, or other written materials from your doctor or pharmacy?: 1 - Never  Interpreter Needed?: No  Information entered by :: Dellie Fergusson, LPN   Activities of Daily Living     03/12/2024   11:45 AM  In your present state of  health, do you have any difficulty performing the following activities:  Hearing? 0  Vision? 0  Difficulty concentrating or making decisions? 1  Comment TROUBLE W/ ALL 3  Walking or climbing stairs? 1  Comment SLOW & HOLD A RAIL  Dressing or bathing? 0  Doing errands, shopping? 0  Preparing Food and eating ? N  Using the Toilet? N  In the past six months, have you accidently leaked urine? N  Do you have problems with loss of bowel control? N  Managing your Medications? N  Managing your Finances? N  Housekeeping or managing your Housekeeping? N    Patient Care Team: Quinton Buckler, FNP as PCP - General (Nurse Practitioner) Fredric Jean, MD as Referring Physician [facility], [facility]  I have updated your Care Teams any recent Medical Services you may have received from other providers in the past year.     Assessment:   This is a routine wellness examination for Carry.  Hearing/Vision screen Hearing Screening - Comments:: NO AIDS Vision Screening - Comments:: READERS- WOODARD   Goals Addressed             This Visit's Progress    DIET - EAT MORE FRUITS AND VEGETABLES         Depression Screen     03/12/2024   11:42 AM 10/24/2023   11:06 AM 03/07/2023    8:54 AM 02/07/2023    9:44 AM 01/09/2023    8:39 AM 05/02/2022    9:45 AM 01/23/2022    1:27 PM  PHQ 2/9 Scores  PHQ - 2 Score 2 3 3 4 1 4 3   PHQ- 9 Score 2 12 8 12 1 14 6     Fall Risk     03/12/2024   11:45 AM 03/07/2023    8:50 AM 02/07/2023    9:44 AM 01/09/2023    8:40 AM 05/02/2022    9:41 AM  Fall Risk   Falls in the past year? 0 0 0 0 0  Number falls in past yr: 0 0 0 0 0  Injury with Fall? 0 0 0 0 0  Risk for fall due to :  No Fall Risks No Fall Risks     Follow up Falls evaluation completed Education provided;Falls prevention discussed   Falls evaluation completed      Data saved with a previous flowsheet row definition    MEDICARE RISK AT HOME:  Medicare Risk at Home Any stairs in or  around the home?: Yes If so, are there any without handrails?: Yes Home free of loose throw rugs in walkways, pet beds, electrical cords, etc?: Yes Adequate lighting in your home to reduce risk of falls?: Yes Life alert?: No Use of a cane, walker or w/c?: No Grab bars in the bathroom?: Yes Shower chair or bench in shower?: No Elevated toilet seat or a handicapped toilet?: Yes  TIMED UP AND GO:  Was the test performed?  No  Cognitive Function: 6CIT completed        03/12/2024   11:48 AM 03/07/2023    9:11 AM  6CIT Screen  What Year? 0 points 0 points  What month? 0 points 0 points  What time? 0 points 0 points  Count back from 20 0 points 0 points  Months in reverse 0 points 0 points  Repeat phrase 0 points 0 points  Total Score 0 points 0 points    Immunizations Immunization History  Administered Date(s) Administered   Influenza,inj,Quad PF,6+ Mos 07/02/2019   PFIZER(Purple Top)SARS-COV-2 Vaccination 11/24/2019, 12/15/2019, 01/11/2022   Tdap 01/18/2022    Screening Tests Health Maintenance  Topic Date Due   Zoster Vaccines- Shingrix (1 of 2) Never done   Cervical Cancer Screening (HPV/Pap Cotest)  04/16/2023   COVID-19 Vaccine (4 - 2024-25 season) 05/26/2023   MAMMOGRAM  02/20/2025   Medicare Annual Wellness (AWV)  03/12/2025   Colonoscopy  12/10/2029   DTaP/Tdap/Td (2 - Td or Tdap) 01/19/2032   Hepatitis C Screening  Completed   HIV Screening  Completed   HPV VACCINES  Aged Out   Meningococcal B Vaccine  Aged Out   INFLUENZA VACCINE  Discontinued    Health Maintenance  Health Maintenance Due  Topic Date Due   Zoster Vaccines- Shingrix (1 of 2) Never done   Cervical Cancer Screening (HPV/Pap Cotest)  04/16/2023   COVID-19 Vaccine (4 - 2024-25 season) 05/26/2023   Health Maintenance Items Addressed: UP TO DATE ON MAMMOGRAM & COLONOSCOPY; NEED SHINGRIX , COVID  Additional Screening:  Vision Screening: Recommended annual ophthalmology exams for early  detection of glaucoma and other disorders of the eye. Would you like a referral to an eye doctor? No    Dental Screening: Recommended annual dental exams for proper oral hygiene  Community Resource Referral / Chronic Care Management: CRR required this visit?  No   CCM required this visit?  No   Plan:    I have personally reviewed and noted the following in the patient's chart:   Medical and social history Use of alcohol, tobacco or illicit drugs  Current medications and supplements including opioid prescriptions. Patient is not currently taking opioid prescriptions. Functional ability and status Nutritional status Physical activity Advanced directives List of other physicians Hospitalizations, surgeries, and ER visits in previous 12 months Vitals Screenings to include cognitive, depression, and falls Referrals and appointments  In addition, I have reviewed and discussed with patient certain preventive protocols, quality metrics, and best practice recommendations. A written personalized care plan for preventive services as well as general preventive health recommendations were provided to patient.   Pinky Bright, LPN   [id number]   After Visit Summary: (MyChart) Due  to this being a telephonic visit, the after visit summary with patients personalized plan was offered to patient via MyChart   Notes: Nothing significant to report at this time.

## 2024-03-12 NOTE — Patient Instructions (Addendum)
 Nicole Osborn , Thank you for taking time out of your busy schedule to complete your Annual Wellness Visit with me. I enjoyed our conversation and look forward to speaking with you again next year. I, as well as your care team,  appreciate your ongoing commitment to your health goals. Please review the following plan we discussed and let me know if I can assist you in the future.   Follow up Visits: Next Medicare AWV with our clinical staff:   03/25/25 @ 11:30 AM BY PHONE Have you seen your provider in the last 6 months (3 months if uncontrolled diabetes)? Yes   Clinician Recommendations:  Aim for 30 minutes of exercise or brisk walking, 6-8 glasses of water, and 5 servings of fruits and vegetables each day. Take care!      This is a list of the screening recommended for you and due dates:  Health Maintenance  Topic Date Due   Zoster (Shingles) Vaccine (1 of 2) Never done   Pap with HPV screening  04/16/2023   COVID-19 Vaccine (4 - 2024-25 season) 05/26/2023   Mammogram  02/20/2025   Medicare Annual Wellness Visit  03/12/2025   Colon Cancer Screening  12/10/2029   DTaP/Tdap/Td vaccine (2 - Td or Tdap) 01/19/2032   Hepatitis C Screening  Completed   HIV Screening  Completed   HPV Vaccine  Aged Out   Meningitis B Vaccine  Aged Out   Flu Shot  Discontinued    Advanced directives: (ACP Link)Information on Advanced Care Planning can be found at [place]  Best boy Advance Health Care Directives Advance Health Care Directives. [email or web address]  Advance Care Planning is important because it:  [x]  Makes sure you receive the medical care that is consistent with your values, goals, and preferences  [x]  It provides guidance to your family and loved ones and reduces their decisional burden about whether or not they are making the right decisions based on your wishes.  Follow the link provided in your after visit summary or read over the paperwork we have mailed to you to help you started  getting your Advance Directives in place. If you need assistance in completing these, please reach out to us  so that we can help you!

## 2024-03-17 NOTE — Progress Notes (Signed)
 MRN : [id number]  Nicole Osborn is a 53 y.o. (07/25/1971) female who presents with chief complaint of legs swell.  History of Present Illness:   The patient returns to the office for followup evaluation regarding leg swelling.  The swelling has persisted and the pain associated with swelling continues. There have not been any interval development of a ulcerations or wounds.   Since the previous visit the patient has been wearing graduated compression stockings and has noted little if any improvement in the lymphedema. The patient has been using compression routinely morning until night.   The patient also states elevation during the day and exercise is being done too.    She has finally received her lymphedema pump and has been using it on a routine basis with great success.  It has been a huge benefit.  No outpatient medications have been marked as taking for the 03/19/24 encounter (Appointment) with Jama, Cordella MATSU, MD.    Past Medical History:  Diagnosis Date   Anxiety    Arthritis    Cholecystitis with cholelithiasis 09/19/2017   Chronic venous insufficiency of lower extremity    Depression    Elevated blood pressure reading    no meds   GERD (gastroesophageal reflux disease)    occ   Heartburn    History of kidney stones    h/o   Hyperlipidemia    Hypothyroidism    Migraine 09/23/2017   Migraines    without aura   Obesity    Thyroid  disease    Varicose veins of both lower extremities 09/06/2017    Past Surgical History:  Procedure Laterality Date   CHOLECYSTECTOMY N/A 10/08/2017   Procedure: LAPAROSCOPIC CHOLECYSTECTOMY;  Surgeon: Wonda Charlie BRAVO, MD;  Location: [facility] ORS;  Service: General;  Laterality: N/A;   COLONOSCOPY WITH PROPOFOL  N/A 12/11/2019   Procedure: COLONOSCOPY WITH PROPOFOL ;  Surgeon: Unk Corinn Skiff, MD;  Location: [facility] ENDOSCOPY;  Service: Gastroenterology;  Laterality: N/A;   Removal of extra tooth     age  53    Social History Social History   Tobacco Use   Smoking status: Never   Smokeless tobacco: Never  Vaping Use   Vaping status: Never Used  Substance Use Topics   Alcohol use: No   Drug use: No    Family History Family History  Problem Relation Age of Onset   Migraines Mother    Depression Mother    Arthritis Mother    Stroke Father    Hypertension Father    Migraines Maternal Uncle    Breast cancer Paternal Aunt 27   Emphysema Maternal Grandmother    Emphysema Maternal Grandfather    Dementia Paternal Grandmother    Asthma Son    Eczema Son    Kidney disease Neg Hx    Bladder Cancer Neg Hx     Allergies  Allergen Reactions   Peanut-Containing Drug Products Other (See Comments)   Wellbutrin  [Bupropion ] Other (See Comments)    Reaction - insects crawling in my head   Cefaclor Swelling    Lips swelling      REVIEW OF SYSTEMS (Negative unless checked)  Constitutional: [] Weight loss  [] Fever  [] Chills Cardiac: [] Chest pain   [] Chest pressure   [] Palpitations   [] Shortness of breath when laying flat   [] Shortness of breath with exertion.  Vascular:  [] Pain in legs with walking   [x] Pain in legs with standing  [] History of DVT   [] Phlebitis   [x] Swelling in legs   [] Varicose veins   [] Non-healing ulcers Pulmonary:   [] Uses home oxygen   [] Productive cough   [] Hemoptysis   [] Wheeze  [] COPD   [] Asthma Neurologic:  [] Dizziness   [] Seizures   [] History of stroke   [] History of TIA  [] Aphasia   [] Vissual changes   [] Weakness or numbness in arm   [] Weakness or numbness in leg Musculoskeletal:   [] Joint swelling   [] Joint pain   [] Low back pain Hematologic:  [] Easy bruising  [] Easy bleeding   [] Hypercoagulable state   [] Anemic Gastrointestinal:  [] Diarrhea   [] Vomiting  [] Gastroesophageal reflux/heartburn   [] Difficulty swallowing. Genitourinary:  [] Chronic kidney disease   [] Difficult urination  [] Frequent urination   [] Blood in urine Skin:  [] Rashes   [] Ulcers   Psychological:  [] History of anxiety   []  History of major depression.  Physical Examination  There were no vitals filed for this visit. There is no height or weight on file to calculate BMI. Gen: WD/WN, NAD Head: /AT, No temporalis wasting.  Ear/Nose/Throat: Hearing grossly intact, nares w/o erythema or drainage, pinna without lesions Eyes: PER, EOMI, sclera nonicteric.  Neck: Supple, no gross masses.  No JVD.  Pulmonary:  Good air movement, no audible wheezing, no use of accessory muscles.  Cardiac: RRR, precordium not hyperdynamic. Vascular:  scattered varicosities present bilaterally.  Mild venous stasis changes to the legs bilaterally.  3-4+ soft pitting edema, CEAP [id number]  Vessel Right Left  Radial Palpable Palpable  Gastrointestinal: soft, non-distended. No guarding/no peritoneal signs.  Musculoskeletal: M/S 5/5 throughout.  No deformity.  Neurologic: CN 2-12 intact. Pain and light touch intact in extremities.  Symmetrical.  Speech is fluent. Motor exam as listed above. Psychiatric: Judgment intact, Mood & affect appropriate for pt's clinical situation. Dermatologic: Venous rashes no ulcers noted.  No changes consistent with cellulitis. Lymph : No lichenification or skin changes of chronic lymphedema.  CBC Lab Results  Component Value Date   WBC 5.5 10/24/2023   HGB 13.9 10/24/2023   HCT 42.0 10/24/2023   MCV 91.3 10/24/2023   PLT 241 10/24/2023    BMET    Component Value Date/Time   NA 142 10/24/2023 1142   K 4.3 10/24/2023 1142   CL 107 10/24/2023 1142   CO2 25 10/24/2023 1142   GLUCOSE 109 (H) 10/24/2023 1142   BUN 12 10/24/2023 1142   CREATININE 0.65 10/24/2023 1142   CALCIUM 9.5 10/24/2023 1142   GFRNONAA >60 01/18/2022 1140   GFRNONAA 99 02/08/2021 1600   GFRAA 114 02/08/2021 1600   CrCl cannot be calculated (Patient's most recent lab result is older than the maximum 21 days allowed.).  COAG Lab Results  Component Value Date   INR 1.1  01/18/2022   INR 1.0 10/27/2019   INR 1.0 07/17/2019    Radiology No results found.   Assessment/Plan 1. Lymphedema (Primary) Recommend:  No surgery or intervention at this point in time.    I have reviewed my discussion with the patient regarding lymphedema and why it  causes symptoms.  Patient will continue wearing graduated compression on a daily basis. The patient should put the compression on first thing in the morning and removing them in the evening. The patient should not sleep in the compression.   In addition, behavioral modification throughout the day will be continued.  This will include frequent elevation (  such as in a recliner), use of over the counter pain medications as needed and exercise such as walking.  The systemic causes for chronic edema such as liver, kidney and cardiac etiologies does not appear to have significant changed over the past year.    The patient will continue aggressive use of the  lymph pump.  This will continue to improve the edema control and prevent sequela such as ulcers and infections.   The patient will follow-up with me on an annual basis.   2. Mixed hyperlipidemia Continue statin as ordered and reviewed, no changes at this time  3. Chronic venous insufficiency Recommend:  No surgery or intervention at this point in time.    I have reviewed my discussion with the patient regarding lymphedema and why it  causes symptoms.  Patient will continue wearing graduated compression on a daily basis. The patient should put the compression on first thing in the morning and removing them in the evening. The patient should not sleep in the compression.   In addition, behavioral modification throughout the day will be continued.  This will include frequent elevation (such as in a recliner), use of over the counter pain medications as needed and exercise such as walking.  The systemic causes for chronic edema such as liver, kidney and cardiac  etiologies does not appear to have significant changed over the past year.    The patient will continue aggressive use of the  lymph pump.  This will continue to improve the edema control and prevent sequela such as ulcers and infections.   The patient will follow-up with me on an annual basis.   4. Essential hypertension Continue antihypertensive medications as already ordered, these medications have been reviewed and there are no changes at this time.    Cordella Shawl, MD  03/17/2024 2:52 PM

## 2024-03-19 ENCOUNTER — Ambulatory Visit (INDEPENDENT_AMBULATORY_CARE_PROVIDER_SITE_OTHER): Payer: Medicare Other | Admitting: Vascular Surgery

## 2024-03-19 ENCOUNTER — Encounter (INDEPENDENT_AMBULATORY_CARE_PROVIDER_SITE_OTHER): Payer: Self-pay | Admitting: Vascular Surgery

## 2024-03-19 VITALS — BP 133/81 | HR 86 | Ht 65.0 in | Wt 234.0 lb

## 2024-03-19 DIAGNOSIS — I890 Lymphedema, not elsewhere classified: Secondary | ICD-10-CM | POA: Diagnosis not present

## 2024-03-19 DIAGNOSIS — I872 Venous insufficiency (chronic) (peripheral): Secondary | ICD-10-CM

## 2024-03-19 DIAGNOSIS — E782 Mixed hyperlipidemia: Secondary | ICD-10-CM | POA: Diagnosis not present

## 2024-03-19 DIAGNOSIS — I10 Essential (primary) hypertension: Secondary | ICD-10-CM | POA: Diagnosis not present

## 2024-03-22 ENCOUNTER — Encounter (INDEPENDENT_AMBULATORY_CARE_PROVIDER_SITE_OTHER): Payer: Self-pay | Admitting: Vascular Surgery

## 2024-04-20 DIAGNOSIS — L821 Other seborrheic keratosis: Secondary | ICD-10-CM | POA: Diagnosis not present

## 2024-04-20 DIAGNOSIS — B078 Other viral warts: Secondary | ICD-10-CM | POA: Diagnosis not present

## 2024-04-23 ENCOUNTER — Ambulatory Visit: Payer: Self-pay | Admitting: Nurse Practitioner

## 2024-04-27 ENCOUNTER — Telehealth: Payer: Self-pay

## 2024-04-27 NOTE — Telephone Encounter (Signed)
It has been faxed today 

## 2024-04-27 NOTE — Telephone Encounter (Signed)
 Copied from CRM #[id number]. Topic: Clinical - Medication Question >> Apr 27, 2024  2:25 PM Willma R wrote: Reason for CRM: Particia from [facility] sent fax on 04/23/24 for medical clearance for patient to start taking Gabapentin . Requesting a status update on request.   Particia can be reached at [phone]

## 2024-04-28 ENCOUNTER — Other Ambulatory Visit: Payer: Self-pay | Admitting: Nurse Practitioner

## 2024-04-28 DIAGNOSIS — E034 Atrophy of thyroid (acquired): Secondary | ICD-10-CM

## 2024-04-30 NOTE — Telephone Encounter (Signed)
 Requested Prescriptions  Refused Prescriptions Disp Refills   levothyroxine  (SYNTHROID ) 112 MCG tablet [Pharmacy Med Name: LEVOTHYROXINE  112 MCG TABLET] 90 tablet 1    Sig: TAKE 1 TABLET BY MOUTH EVERY DAY BEFORE BREAKFAST     There is no refill protocol information for this order

## 2024-05-18 ENCOUNTER — Encounter: Payer: Self-pay | Admitting: *Deleted

## 2024-05-18 ENCOUNTER — Encounter: Payer: Self-pay | Admitting: Neurology

## 2024-05-21 ENCOUNTER — Other Ambulatory Visit: Payer: Self-pay | Admitting: Nurse Practitioner

## 2024-05-21 DIAGNOSIS — F330 Major depressive disorder, recurrent, mild: Secondary | ICD-10-CM

## 2024-05-22 NOTE — Telephone Encounter (Signed)
 Requested Prescriptions  Pending Prescriptions Disp Refills   DULoxetine  (CYMBALTA ) 20 MG capsule [Pharmacy Med Name: DULOXETINE  HCL DR 20 MG CAP] 180 capsule 0    Sig: TAKE 2 CAPSULES BY MOUTH EVERY DAY     Psychiatry: Antidepressants - SNRI - duloxetine  Passed - 05/22/2024  2:10 PM      Passed - Cr in normal range and within 360 days    Creat  Date Value Ref Range Status  10/24/2023 0.65 0.50 - 1.03 mg/dL Final         Passed - eGFR is 30 or above and within 360 days    GFR, Est African American  Date Value Ref Range Status  02/08/2021 114 > OR = 60 mL/min/1.14m2 Final   GFR, Est Non African American  Date Value Ref Range Status  02/08/2021 99 > OR = 60 mL/min/1.43m2 Final   GFR, Estimated  Date Value Ref Range Status  01/18/2022 >60 >60 mL/min Final    Comment:    (NOTE) Calculated using the CKD-EPI Creatinine Equation (2021)    eGFR  Date Value Ref Range Status  10/24/2023 105 > OR = 60 mL/min/1.56m2 Final         Passed - Completed PHQ-2 or PHQ-9 in the last 360 days      Passed - Last BP in normal range    BP Readings from Last 1 Encounters:  03/19/24 133/81         Passed - Valid encounter within last 6 months    Recent Outpatient Visits   None

## 2024-05-27 ENCOUNTER — Other Ambulatory Visit: Payer: Self-pay | Admitting: Nurse Practitioner

## 2024-05-27 DIAGNOSIS — E034 Atrophy of thyroid (acquired): Secondary | ICD-10-CM

## 2024-05-27 NOTE — Telephone Encounter (Unsigned)
 Copied from CRM #[id number]. Topic: Clinical - Medication Refill >> May 27, 2024  4:31 PM Carla L wrote: Medication: levothyroxine  (SYNTHROID ) 112 MCG tablet Requesting refills, patient scheduled follow up appointment for 06/04/2024.   Has the patient contacted their pharmacy? Yes Told to contact the office.   This is the patient's preferred pharmacy:  [id number] - [place], [place] - [address] [place] [address] Phone: [phone] Fax: [phone]  Is this the correct pharmacy for this prescription? No  Has the prescription been filled recently? No  Is the patient out of the medication? No  Has the patient been seen for an appointment in the last year OR does the patient have an upcoming appointment? Yes  Can we respond through MyChart? No  Agent: Please be advised that Rx refills may take up to 3 business days. We ask that you follow-up with your pharmacy.

## 2024-05-28 MED ORDER — LEVOTHYROXINE SODIUM 112 MCG PO TABS: 112.0000 ug | ORAL_TABLET | Freq: Every day | ORAL | 1 refills | Status: AC

## 2024-05-28 NOTE — Telephone Encounter (Signed)
 Requested Prescriptions  Pending Prescriptions Disp Refills   levothyroxine  (SYNTHROID ) 112 MCG tablet 90 tablet 1    Sig: Take 1 tablet (112 mcg total) by mouth daily before breakfast.     Endocrinology:  Hypothyroid Agents Passed - 05/28/2024 12:24 PM      Passed - TSH in normal range and within 360 days    TSH  Date Value Ref Range Status  10/24/2023 1.60 mIU/L Final    Comment:              Reference Range .           > or = 20 Years  0.40-4.50 .                Pregnancy Ranges           First trimester    0.26-2.66           Second trimester   0.55-2.73           Third trimester    0.43-2.91          Passed - Valid encounter within last 12 months    Recent Outpatient Visits   None

## 2024-06-04 ENCOUNTER — Ambulatory Visit (INDEPENDENT_AMBULATORY_CARE_PROVIDER_SITE_OTHER): Admitting: Nurse Practitioner

## 2024-06-04 ENCOUNTER — Encounter: Payer: Self-pay | Admitting: Nurse Practitioner

## 2024-06-04 VITALS — BP 124/76 | HR 73 | Resp 16 | Ht 65.0 in | Wt 231.8 lb

## 2024-06-04 DIAGNOSIS — I10 Essential (primary) hypertension: Secondary | ICD-10-CM

## 2024-06-04 DIAGNOSIS — K580 Irritable bowel syndrome with diarrhea: Secondary | ICD-10-CM

## 2024-06-04 DIAGNOSIS — G43009 Migraine without aura, not intractable, without status migrainosus: Secondary | ICD-10-CM

## 2024-06-04 DIAGNOSIS — R41840 Attention and concentration deficit: Secondary | ICD-10-CM

## 2024-06-04 DIAGNOSIS — Z6838 Body mass index (BMI) 38.0-38.9, adult: Secondary | ICD-10-CM

## 2024-06-04 DIAGNOSIS — E034 Atrophy of thyroid (acquired): Secondary | ICD-10-CM | POA: Diagnosis not present

## 2024-06-04 DIAGNOSIS — F411 Generalized anxiety disorder: Secondary | ICD-10-CM

## 2024-06-04 DIAGNOSIS — Z1159 Encounter for screening for other viral diseases: Secondary | ICD-10-CM

## 2024-06-04 DIAGNOSIS — F330 Major depressive disorder, recurrent, mild: Secondary | ICD-10-CM

## 2024-06-04 DIAGNOSIS — E66812 Obesity, class 2: Secondary | ICD-10-CM | POA: Diagnosis not present

## 2024-06-04 DIAGNOSIS — I890 Lymphedema, not elsewhere classified: Secondary | ICD-10-CM

## 2024-06-04 DIAGNOSIS — I872 Venous insufficiency (chronic) (peripheral): Secondary | ICD-10-CM

## 2024-06-04 DIAGNOSIS — R7303 Prediabetes: Secondary | ICD-10-CM

## 2024-06-04 DIAGNOSIS — E782 Mixed hyperlipidemia: Secondary | ICD-10-CM

## 2024-06-04 NOTE — Progress Notes (Signed)
 BP 124/76   Pulse 73   Resp 16   Ht 5' 5 (1.651 m)   Wt 231 lb 12.8 oz (105.1 kg)   LMP 07/12/2016   SpO2 95%   BMI 38.57 kg/m    Subjective:    Patient ID: Nicole Osborn, female    DOB: Oct 01, 1970, 53 y.o.   MRN: [id number]  HPI: Nicole Osborn is a 53 y.o. female  Chief Complaint  Patient presents with   Medical Management of Chronic Issues   Discussed the use of AI scribe software for clinical note transcription with the patient, who gave verbal consent to proceed.  History of Present Illness Nicole Osborn is a 53 year old female who presents for a six-month follow-up.  Migraine headaches - Recurrent migraines since age 62 - Recent increase in frequency, occurring on Sunday, Monday, Tuesday, and today - Sumatriptan  100 mg as needed typically resolves migraine with one pill, occasionally requires two - Sumatriptan  exacerbates IBS-related diarrhea - Previous trials of various preventative medications discontinued due to side effects - Recent neurology consultation suggested treatments including Aimovig  injections; hesitant to start new pharmaceuticals - Family history of migraines: uncle had success with anti-seizure medication, mother's migraines resolved post-menopause  Gastrointestinal symptoms - Irritable bowel syndrome with diarrhea since 2017 - Diarrhea worsened by sumatriptan  use - Frequent diarrhea despite concern about weight  Respiratory symptoms - Persistent cough, worsened with weather changes - Morning production of mucus - No current use of allergy medications; considering over-the-counter options such as Zyrtec or Claritin  Lymphedema - Lymphedema in legs - Uses lymphedema pump once daily, which helps prevent worsening  Obesity and weight management - Current weight 231 pounds, BMI 38.57 - Concern about weight despite frequent diarrhea and regular exercise - Walks 30-40 minutes two to three times per week - Interest in dietary changes,  specifically a carnivore diet, with concerns about long-term effects and adherence  Chest wall pain - History of chest contusion from previous accident - Occasional soreness and tenderness in breast area  Hypertension - History of hypertension -blood pressure at goal  Hypothyroidism - History of hypothyroidism - Takes levothyroxine  112 mcg daily - Last TSH within normal range  Depression and anxiety - History of depression and anxiety - Takes Cymbalta  40 mg daily  Hyperlipidemia - Last lipid panel: LDL 148, triglycerides 159 The 10-year ASCVD risk score (Arnett DK, et al., 2019) is: 2.4%   Values used to calculate the score:     Age: 29 years     Clincally relevant sex: Female     Is Non-Hispanic African American: No     Diabetic: No     Tobacco smoker: No     Systolic Blood Pressure: 124 mmHg     Is BP treated: No     HDL Cholesterol: 44 mg/dL     Total Cholesterol: 221 mg/dL   Prediabetes - Last J8r 6.3, consistent with prediabetes     Waist Measurement : 45 inches     06/04/2024   11:09 AM 03/12/2024   11:42 AM 10/24/2023   11:06 AM  Depression screen PHQ 2/9  Decreased Interest 1 1 2   Down, Depressed, Hopeless 1 1 1   PHQ - 2 Score 2 2 3   Altered sleeping 0 0 1  Tired, decreased energy 0 0 1  Change in appetite 1 0 3  Feeling bad or failure about yourself  0 0 2  Trouble concentrating 1 0 2  Moving slowly or  fidgety/restless 0 0 0  Suicidal thoughts 0 0 0  PHQ-9 Score 4 2 12   Difficult doing work/chores Somewhat difficult Not difficult at all Somewhat difficult       06/04/2024   11:10 AM 10/24/2023   11:15 AM 02/07/2023    9:45 AM 09/13/2021    1:31 PM  GAD 7 : Generalized Anxiety Score  Nervous, Anxious, on Edge 1 1 2  0  Control/stop worrying 1 1 2  0  Worry too much - different things 1 1 2  0  Trouble relaxing 1 2 2  0  Restless 1 0 0 0  Easily annoyed or irritable 0 1 0 0  Afraid - awful might happen 1 1 0 0  Total GAD 7 Score 6 7 8  0  Anxiety  Difficulty Somewhat difficult Somewhat difficult Somewhat difficult Not difficult at all     Relevant past medical, surgical, family and social history reviewed and updated as indicated. Interim medical history since our last visit reviewed. Allergies and medications reviewed and updated.  Review of Systems  Ten systems reviewed and is negative except as mentioned in HPI      Objective:      BP 124/76   Pulse 73   Resp 16   Ht 5' 5 (1.651 m)   Wt 231 lb 12.8 oz (105.1 kg)   LMP 07/12/2016   SpO2 95%   BMI 38.57 kg/m    Wt Readings from Last 3 Encounters:  06/04/24 231 lb 12.8 oz (105.1 kg)  03/19/24 234 lb (106.1 kg)  11/21/23 236 lb (107 kg)    Physical Exam VITALS: BP- 124/76 MEASUREMENTS: Weight- 231, BMI- 38.57. GENERAL: Alert, cooperative, well developed, no acute distress HEENT: Normocephalic, normal oropharynx, moist mucous membranes CHEST: Clear to auscultation bilaterally, no wheezes, rhonchi, or crackles CARDIOVASCULAR: Normal heart rate and rhythm, S1 and S2 normal without murmurs ABDOMEN: Soft, non-tender, non-distended, without organomegaly, normal bowel sounds EXTREMITIES: No cyanosis or edema NEUROLOGICAL: Cranial nerves grossly intact, moves all extremities without gross motor or sensory deficit  Results for orders placed or performed in visit on 10/24/23  CBC with Differential/Platelet   Collection Time: 10/24/23 11:42 AM  Result Value Ref Range   WBC 5.5 3.8 - 10.8 Thousand/uL   RBC 4.60 3.80 - 5.10 Million/uL   Hemoglobin 13.9 11.7 - 15.5 g/dL   HCT 57.9 64.9 - 54.9 %   MCV 91.3 80.0 - 100.0 fL   MCH 30.2 27.0 - 33.0 pg   MCHC 33.1 32.0 - 36.0 g/dL   RDW 87.0 88.9 - 84.9 %   Platelets 241 140 - 400 Thousand/uL   MPV 10.9 7.5 - 12.5 fL   Neutro Abs 2,684 1,500 - 7,800 cells/uL   Absolute Lymphocytes 2,057 850 - 3,900 cells/uL   Absolute Monocytes 429 200 - 950 cells/uL   Eosinophils Absolute 220 15 - 500 cells/uL   Basophils Absolute 110 0  - 200 cells/uL   Neutrophils Relative % 48.8 %   Total Lymphocyte 37.4 %   Monocytes Relative 7.8 %   Eosinophils Relative 4.0 %   Basophils Relative 2.0 %  COMPLETE METABOLIC PANEL WITH GFR   Collection Time: 10/24/23 11:42 AM  Result Value Ref Range   Glucose, Bld 109 (H) 65 - 99 mg/dL   BUN 12 7 - 25 mg/dL   Creat 9.34 9.49 - 8.96 mg/dL   eGFR 894 > OR = 60 fO/fpw/8.26f7   BUN/Creatinine Ratio SEE NOTE: 6 - 22 (calc)   Sodium 142  135 - 146 mmol/L   Potassium 4.3 3.5 - 5.3 mmol/L   Chloride 107 98 - 110 mmol/L   CO2 25 20 - 32 mmol/L   Calcium 9.5 8.6 - 10.4 mg/dL   Total Protein 7.2 6.1 - 8.1 g/dL   Albumin 4.5 3.6 - 5.1 g/dL   Globulin 2.7 1.9 - 3.7 g/dL (calc)   AG Ratio 1.7 1.0 - 2.5 (calc)   Total Bilirubin 0.5 0.2 - 1.2 mg/dL   Alkaline phosphatase (APISO) 83 37 - 153 U/L   AST 12 10 - 35 U/L   ALT 11 6 - 29 U/L  Lipid panel   Collection Time: 10/24/23 11:42 AM  Result Value Ref Range   Cholesterol 221 (H) <200 mg/dL   HDL 44 (L) > OR = 50 mg/dL   Triglycerides 840 (H) <150 mg/dL   LDL Cholesterol (Calc) 148 (H) mg/dL (calc)   Total CHOL/HDL Ratio 5.0 (H) <5.0 (calc)   Non-HDL Cholesterol (Calc) 177 (H) <130 mg/dL (calc)  Hemoglobin J8r   Collection Time: 10/24/23 11:42 AM  Result Value Ref Range   Hgb A1c MFr Bld 6.3 (H) <5.7 % of total Hgb   Mean Plasma Glucose 134 mg/dL   eAG (mmol/L) 7.4 mmol/L  TSH   Collection Time: 10/24/23 11:42 AM  Result Value Ref Range   TSH 1.60 mIU/L          Assessment & Plan:   Problem List Items Addressed This Visit       Cardiovascular and Mediastinum   Essential hypertension - Primary   Relevant Orders   CBC with Differential/Platelet   Comprehensive metabolic panel with GFR   Migraine   Chronic venous insufficiency     Digestive   IBS (irritable bowel syndrome)     Endocrine   Hypothyroidism, unspecified   Relevant Orders   TSH     Other   Hyperlipidemia   Relevant Orders   Lipid panel   Anxiety  disorder   Class 2 severe obesity with serious comorbidity and body mass index (BMI) of 38.0 to 38.9 in adult (HCC)   Relevant Orders   TSH   Cortisol, free, Serum   Lymphedema   Mild episode of recurrent major depressive disorder (HCC)   Other Visit Diagnoses       Prediabetes       Relevant Orders   Comprehensive metabolic panel with GFR   Hemoglobin A1c     Need for hepatitis B screening test       Relevant Orders   Hepatitis B Surface AntiBODY     Inattention       Relevant Orders   Ambulatory referral to Psychology        Assessment and Plan Assessment & Plan Migraine without aura Chronic migraines since age 9, with frequent episodes. Sumatriptan  is effective but exacerbates IBS symptoms. She is exploring alternative treatments and is interested in trying Nurtec due to stomach issues with sumatriptan . - Provide sample of Nurtec for trial use - Educate on potential side effects of Nurtec, including nausea and fatigue - Discuss alternative migraine treatments, including Botox and injectable preventatives  Irritable bowel syndrome with diarrhea Chronic IBS with diarrhea, exacerbated by sumatriptan  use. She is aware of triggers and is managing symptoms as best as possible.  Obesity, class 2 Obesity with a BMI of 38.57. She expresses interest in weight loss programs and dietary changes. Discussed potential benefits of a Mediterranean diet for reducing inflammation. - Discuss [facility] in [place] - Encourage trial of Mediterranean diet for inflammation reduction - Discuss potential benefits and challenges of a carnivore diet  Lymphedema of lower extremities Lymphedema managed with leg pump therapy. She reports using the pump once daily and notes some improvement in symptoms.  Chronic venous insufficiency of lower extremities Chronic venous insufficiency managed with vascular surgery follow-up and use of lymphedema pump.  Essential  hypertension Blood pressure reading today is 124/76 mmHg.  Hypothyroidism Hypothyroidism managed with levothyroxine . Last TSH was within normal range. - Check thyroid  levels  Mixed hyperlipidemia Mixed hyperlipidemia with LDL of 148 mg/dL and triglycerides of 840 mg/dL. - Recheck lipid panel  Prediabetes Prediabetes with last A1c at 6.3%.  Depression and generalized anxiety disorder Depression and anxiety managed with Cymbalta  40 mg daily. She expresses interest in discontinuing duloxetine  and exploring ADD testing.  Attention and concentration deficit (suspected) Suspected attention and concentration deficit. She expresses interest in ADD testing and has been in counseling for several years. - Refer to [facility] for ADD testing  Chronic cough with suspected allergic etiology Chronic cough suspected to be related to allergies, worsens with weather changes. She does not currently take allergy medication. - Recommend trial of over-the-counter allergy medication such as Zyrtec, Claritin, or Allegra      Follow up plan: Return in about 6 months (around 12/02/2024) for follow up.

## 2024-06-05 ENCOUNTER — Ambulatory Visit: Payer: Self-pay | Admitting: Nurse Practitioner

## 2024-06-05 DIAGNOSIS — E034 Atrophy of thyroid (acquired): Secondary | ICD-10-CM

## 2024-06-05 MED ORDER — LEVOTHYROXINE SODIUM 100 MCG PO TABS: ORAL_TABLET | ORAL | 0 refills | Status: AC

## 2024-06-08 ENCOUNTER — Other Ambulatory Visit: Payer: Self-pay | Admitting: Nurse Practitioner

## 2024-06-08 DIAGNOSIS — E034 Atrophy of thyroid (acquired): Secondary | ICD-10-CM

## 2024-06-20 LAB — COMPREHENSIVE METABOLIC PANEL WITH GFR
AG Ratio: 1.5 (calc) (ref 1.0–2.5)
ALT: 13 U/L (ref 6–29)
AST: 15 U/L (ref 10–35)
Albumin: 4.3 g/dL (ref 3.6–5.1)
Alkaline phosphatase (APISO): 78 U/L (ref 37–153)
BUN: 10 mg/dL (ref 7–25)
CO2: 26 mmol/L (ref 20–32)
Calcium: 9.4 mg/dL (ref 8.6–10.4)
Chloride: 106 mmol/L (ref 98–110)
Creat: 0.66 mg/dL (ref 0.50–1.03)
Globulin: 2.8 g/dL (ref 1.9–3.7)
Glucose, Bld: 101 mg/dL — ABNORMAL HIGH (ref 65–99)
Potassium: 3.7 mmol/L (ref 3.5–5.3)
Sodium: 141 mmol/L (ref 135–146)
Total Bilirubin: 0.5 mg/dL (ref 0.2–1.2)
Total Protein: 7.1 g/dL (ref 6.1–8.1)
eGFR: 104 mL/min/1.73m2 (ref >=60)

## 2024-06-20 LAB — CBC WITH DIFFERENTIAL/PLATELET
Absolute Lymphocytes: 1685 {cells}/uL (ref 850–3900)
Absolute Monocytes: 416 {cells}/uL (ref 200–950)
Basophils Absolute: 78 {cells}/uL (ref 0–200)
Basophils Relative: 1.5 %
Eosinophils Absolute: 161 {cells}/uL (ref 15–500)
Eosinophils Relative: 3.1 %
HCT: 40.3 % (ref 35.0–45.0)
Hemoglobin: 13.7 g/dL (ref 11.7–15.5)
MCH: 30.7 pg (ref 27.0–33.0)
MCHC: 34 g/dL (ref 32.0–36.0)
MCV: 90.4 fL (ref 80.0–100.0)
MPV: 10.8 fL (ref 7.5–12.5)
Monocytes Relative: 8 %
Neutro Abs: 2860 {cells}/uL (ref 1500–7800)
Neutrophils Relative %: 55 %
Platelets: 226 Thousand/uL (ref 140–400)
RBC: 4.46 Million/uL (ref 3.80–5.10)
RDW: 13.1 % (ref 11.0–15.0)
Total Lymphocyte: 32.4 %
WBC: 5.2 Thousand/uL (ref 3.8–10.8)

## 2024-06-20 LAB — LIPID PANEL
Cholesterol: 202 mg/dL — ABNORMAL HIGH (ref <200)
HDL: 38 mg/dL — ABNORMAL LOW (ref >=50)
LDL Cholesterol (Calc): 135 mg/dL — ABNORMAL HIGH
Non-HDL Cholesterol (Calc): 164 mg/dL — ABNORMAL HIGH (ref <130)
Total CHOL/HDL Ratio: 5.3 (calc) — ABNORMAL HIGH (ref <5.0)
Triglycerides: 157 mg/dL — ABNORMAL HIGH (ref <150)

## 2024-06-20 LAB — HEMOGLOBIN A1C
Hgb A1c MFr Bld: 6.1 % — ABNORMAL HIGH (ref <5.7)
Mean Plasma Glucose: 128 mg/dL
eAG (mmol/L): 7.1 mmol/L

## 2024-06-20 LAB — TSH: TSH: 0.14 m[IU]/L — ABNORMAL LOW

## 2024-06-20 LAB — HEPATITIS B SURFACE ANTIBODY,QUALITATIVE: Hep B S Ab: NONREACTIVE

## 2024-06-20 LAB — CORTISOL, FREE: Cortisol Free, Ser: 0.5 ug/dL

## 2024-08-19 ENCOUNTER — Other Ambulatory Visit: Payer: Self-pay | Admitting: Nurse Practitioner

## 2024-08-19 DIAGNOSIS — F330 Major depressive disorder, recurrent, mild: Secondary | ICD-10-CM

## 2024-08-21 NOTE — Telephone Encounter (Signed)
 Requested Prescriptions  Pending Prescriptions Disp Refills   DULoxetine  (CYMBALTA ) 20 MG capsule [Pharmacy Med Name: DULOXETINE  HCL DR 20 MG CAP] 180 capsule 1    Sig: TAKE 2 CAPSULES BY MOUTH EVERY DAY     Psychiatry: Antidepressants - SNRI - duloxetine  Passed - 08/21/2024  3:21 PM      Passed - Cr in normal range and within 360 days    Creat  Date Value Ref Range Status  06/04/2024 0.66 0.50 - 1.03 mg/dL Final         Passed - eGFR is 30 or above and within 360 days    GFR, Est African American  Date Value Ref Range Status  02/08/2021 114 > OR = 60 mL/min/1.71m2 Final   GFR, Est Non African American  Date Value Ref Range Status  02/08/2021 99 > OR = 60 mL/min/1.27m2 Final   GFR, Estimated  Date Value Ref Range Status  01/18/2022 >60 >60 mL/min Final    Comment:    (NOTE) Calculated using the CKD-EPI Creatinine Equation (2021)    eGFR  Date Value Ref Range Status  06/04/2024 104 > OR = 60 mL/min/1.47m2 Final         Passed - Completed PHQ-2 or PHQ-9 in the last 360 days      Passed - Last BP in normal range    BP Readings from Last 1 Encounters:  06/04/24 124/76         Passed - Valid encounter within last 6 months    Recent Outpatient Visits           2 months ago Essential hypertension   [facility] Gareth Mliss FALCON, FNP       Future Appointments             In 3 months Gareth, Mliss FALCON, FNP [facility], [place]

## 2024-08-31 ENCOUNTER — Encounter: Payer: Self-pay | Admitting: Nurse Practitioner

## 2024-08-31 ENCOUNTER — Other Ambulatory Visit: Payer: Self-pay | Admitting: Nurse Practitioner

## 2024-08-31 DIAGNOSIS — E034 Atrophy of thyroid (acquired): Secondary | ICD-10-CM

## 2024-08-31 DIAGNOSIS — G43009 Migraine without aura, not intractable, without status migrainosus: Secondary | ICD-10-CM

## 2024-09-25 ENCOUNTER — Other Ambulatory Visit: Payer: Self-pay | Admitting: Nurse Practitioner

## 2024-09-25 DIAGNOSIS — G43009 Migraine without aura, not intractable, without status migrainosus: Secondary | ICD-10-CM

## 2024-09-25 MED ORDER — SUMATRIPTAN SUCCINATE 100 MG PO TABS: 50.0000 mg | ORAL_TABLET | ORAL | 1 refills | Status: AC | PRN

## 2024-12-02 ENCOUNTER — Ambulatory Visit: Admitting: Nurse Practitioner

## 2025-03-17 ENCOUNTER — Ambulatory Visit (INDEPENDENT_AMBULATORY_CARE_PROVIDER_SITE_OTHER): Admitting: Nurse Practitioner

## 2025-03-25 ENCOUNTER — Ambulatory Visit
